# Patient Record
Sex: Male | Born: 1945
Health system: Southern US, Community
[De-identification: ages and names within clinical notes are randomized; demographics above are authoritative.]

## PROBLEM LIST (undated history)

## (undated) DIAGNOSIS — C189 Malignant neoplasm of colon, unspecified: Secondary | ICD-10-CM

## (undated) DIAGNOSIS — K219 Gastro-esophageal reflux disease without esophagitis: Secondary | ICD-10-CM

## (undated) DIAGNOSIS — C801 Malignant (primary) neoplasm, unspecified: Secondary | ICD-10-CM

## (undated) DIAGNOSIS — Z972 Presence of dental prosthetic device (complete) (partial): Secondary | ICD-10-CM

## (undated) DIAGNOSIS — R011 Cardiac murmur, unspecified: Secondary | ICD-10-CM

## (undated) DIAGNOSIS — J449 Chronic obstructive pulmonary disease, unspecified: Secondary | ICD-10-CM

## (undated) DIAGNOSIS — E119 Type 2 diabetes mellitus without complications: Secondary | ICD-10-CM

## (undated) DIAGNOSIS — T7840XA Allergy, unspecified, initial encounter: Secondary | ICD-10-CM

## (undated) DIAGNOSIS — R519 Headache, unspecified: Secondary | ICD-10-CM

## (undated) DIAGNOSIS — E785 Hyperlipidemia, unspecified: Secondary | ICD-10-CM

## (undated) HISTORY — DX: Type 2 diabetes mellitus without complications: E11.9

## (undated) HISTORY — DX: Allergy, unspecified, initial encounter: T78.40XA

## (undated) HISTORY — DX: Hyperlipidemia, unspecified: E78.5

## (undated) HISTORY — DX: Malignant neoplasm of colon, unspecified: C18.9

---

## 1976-01-07 HISTORY — PX: VASECTOMY: SHX75

## 2008-08-17 ENCOUNTER — Ambulatory Visit: Payer: Self-pay | Admitting: Internal Medicine

## 2009-08-01 ENCOUNTER — Ambulatory Visit: Payer: Self-pay | Admitting: Internal Medicine

## 2011-08-07 ENCOUNTER — Ambulatory Visit: Payer: Self-pay | Admitting: Medical

## 2013-06-09 DIAGNOSIS — M6530 Trigger finger, unspecified finger: Secondary | ICD-10-CM | POA: Insufficient documentation

## 2013-09-30 DIAGNOSIS — F17200 Nicotine dependence, unspecified, uncomplicated: Secondary | ICD-10-CM | POA: Insufficient documentation

## 2014-01-06 HISTORY — PX: TRIGGER FINGER RELEASE: SHX641

## 2015-11-27 ENCOUNTER — Telehealth: Payer: Self-pay | Admitting: Surgery

## 2015-11-27 NOTE — Telephone Encounter (Signed)
Left voice message for patient to call and schedule appointment for swelling of inguinal region. Referred by [facility]

## 2015-12-04 ENCOUNTER — Other Ambulatory Visit: Payer: Self-pay

## 2015-12-04 ENCOUNTER — Encounter: Payer: Self-pay | Admitting: Surgery

## 2015-12-04 ENCOUNTER — Ambulatory Visit (INDEPENDENT_AMBULATORY_CARE_PROVIDER_SITE_OTHER): Payer: Medicare Other | Admitting: Surgery

## 2015-12-04 VITALS — BP 111/79 | HR 90 | Temp 98.1°F | Ht 75.0 in | Wt 176.0 lb

## 2015-12-04 DIAGNOSIS — I10 Essential (primary) hypertension: Secondary | ICD-10-CM | POA: Insufficient documentation

## 2015-12-04 DIAGNOSIS — K4020 Bilateral inguinal hernia, without obstruction or gangrene, not specified as recurrent: Secondary | ICD-10-CM | POA: Diagnosis not present

## 2015-12-04 DIAGNOSIS — E785 Hyperlipidemia, unspecified: Secondary | ICD-10-CM | POA: Insufficient documentation

## 2015-12-04 DIAGNOSIS — Z8619 Personal history of other infectious and parasitic diseases: Secondary | ICD-10-CM | POA: Insufficient documentation

## 2015-12-04 DIAGNOSIS — J449 Chronic obstructive pulmonary disease, unspecified: Secondary | ICD-10-CM | POA: Insufficient documentation

## 2015-12-04 NOTE — Progress Notes (Signed)
Subjective:     Patient ID: Kirk Rodriguez, male   DOB: [birth year], 70 y.o.   MRN: [id number]  HPI  70 year old male with well-controlled hyper lipidemia comes in today with complaint of left groin swelling and bulge in the area approximately 1 year ago. The patient states that he had a bad sinus infection and was coughing about a year ago and noticed the area bulging out. The patient states that it doesn't cause pain in the area but he can feel a pressure there. Patient states that it does bulge out he's able to pop it back in very easily. Patient states whenever he coughs or strains sometimes it will pop out but his never had any trouble getting it back in. Patient has a good appetite has been eating well he has not lost any weight he does not have any difficulty with nausea vomiting or constipation. He has not ever had a time where this is popped out he's had to lay down to pop it back in. Patient does not have any difficulty getting around and moving around. 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 He is very active and walks vigorously daily.  Past Medical History:  Diagnosis Date  . Allergy   . Hyperlipidemia    Past Surgical History:  Procedure Laterality Date  . TRIGGER FINGER RELEASE Left 2016  . VASECTOMY Bilateral 1978   Family History  Problem Relation Age of Onset  . Lung cancer Father   . Heart disease Father   . Hypertension Father   . Polycythemia Mother   . Heart disease Mother    Social History   Social History  . Marital status: Married    Spouse name: N/A  . Number of children: N/A  . Years of education: N/A   Social History Main Topics  . Smoking status: Current Every Day Smoker    Packs/day: 0.50    Years: 50.00  . Smokeless tobacco: Never Used  . Alcohol use Yes  . Drug use: No  . Sexual activity: Not Asked   Other Topics Concern  . None   Social  History Narrative  . None    Current Outpatient Prescriptions:  .  aspirin EC 81 MG tablet, Take by mouth., Disp: , Rfl:  .  Cholecalciferol (VITAMIN D3) 2000 units capsule, Take by mouth., Disp: , Rfl:  .  FLUZONE HIGH-DOSE 0.5 ML SUSY, , Disp: , Rfl:  .  Multiple Vitamin (MULTI-VITAMINS) TABS, Take by mouth., Disp: , Rfl:  .  simvastatin (ZOCOR) 80 MG tablet, , Disp: , Rfl:  Not on File   Review of Systems  Constitutional: Negative for activity change, appetite change, chills, diaphoresis, fatigue, fever and unexpected weight change.  HENT: Negative for congestion and sore throat.   Respiratory: Negative for cough, choking, shortness of breath, wheezing and stridor.   Cardiovascular: Negative for chest pain, palpitations and leg swelling.  Gastrointestinal: Negative for abdominal distention, abdominal pain, anal bleeding, blood in stool, constipation, diarrhea, nausea and vomiting.  Genitourinary: Positive for scrotal swelling. Negative for discharge, dysuria, flank pain, hematuria, penile pain, penile swelling and testicular pain.  Musculoskeletal: Negative for back pain and neck pain.  Skin: Negative for color change, pallor, rash and wound.  Neurological: Negative for dizziness and weakness.  Hematological: Negative for adenopathy. Does not bruise/bleed easily.  Psychiatric/Behavioral: Negative for agitation. The patient is  not nervous/anxious.   All other systems reviewed and are negative.      Vitals:   12/04/15 1401  BP: 111/79  Pulse: 90  Temp: 98.1 F (36.7 C)    Objective:   Physical Exam  Constitutional: He is oriented to person, place, and time. He appears well-developed and well-nourished. No distress.  HENT:  Head: Normocephalic and atraumatic.  Right Ear: External ear normal.  Left Ear: External ear normal.  Nose: Nose normal.  Mouth/Throat: Oropharynx is clear and moist. No oropharyngeal exudate.  Eyes: Conjunctivae and EOM are normal. Pupils are equal,  round, and reactive to light. No scleral icterus.  Neck: Normal range of motion. Neck supple. No tracheal deviation present.  Cardiovascular: Normal rate, regular rhythm, normal heart sounds and intact distal pulses.  Exam reveals no gallop and no friction rub.   No murmur heard. Pulmonary/Chest: Effort normal and breath sounds normal. No respiratory distress. He has no wheezes. He has no rales.  Abdominal: Soft. Bowel sounds are normal. He exhibits no distension. There is no tenderness. There is no rebound and no guarding.  Genitourinary: Rectum normal and penis normal. No penile tenderness.  Genitourinary Comments: Left groin with a inguinal hernia approximately 2 cm in size that is easily reducible even with standing and nontender  Right groin with an area of bulge with coughing no distinct defect however large amount of weakness in the same 2 cm area as the left side  Musculoskeletal: Normal range of motion. He exhibits no edema, tenderness or deformity.  Neurological: He is alert and oriented to person, place, and time. No cranial nerve deficit.  Skin: Skin is warm. No rash noted. No erythema. No pallor.  Psychiatric: He has a normal mood and affect. His behavior is normal. Judgment and thought content normal.  Vitals reviewed.      CBC and BMP from 10/09/2015 in Care everywhere   WBC: 7.9, Hgb: 16.0, Hct: 46.0, Plt: 216  NA: 138  K:5.2  Cl:  103  CO2: 29.7  BUN: 20  Cr:1.1  Ca: 9.7  Assessment:     70 year old male with bilateral inguinal hernias    Plan:     I have personally reviewed his past medical history which is positive for hyperlipidemia which is well controlled on simvastatin and aspirin, as well as allergic rhinitis which he takes Nasacort spray and multiple cases of trigger finger in his lateral hands. He comes in today with complaint of left inguinal hernia which is easily reducible and a right hernia defect as well on exam. I discussed with the patient that given how  active he is in the fact that he still does his job and lifts and moves around that it may be in his best interest to get this fixed prior to becoming strangulated. I did discuss with him laparoscopic and open repairs however given that he has bilateral defect that I would recommend a laparoscopic repair to repair both sides at one time.  I discussed possibility of incarceration, strangulation, enlargement in size over time, and the risk of emergency surgery in the face of strangulation.  Also discussed the risk of surgery including recurrence which can be up to 30% in the case of complex hernias, use of prosthetic materials (mesh) and the increased risk of infxn, post-op infxn and the possible need for re-operation and removal of mesh if used, possibility of post-op SBO or ileus, and the risks of general anesthetic including MI, CVA, sudden death or even reaction  to anesthetic medications. The patient understands the risks, any and all questions were answered to the patient's satisfaction.  I will schedule him for a laparoscopic bilateral inguinal hernia repair on 12/12.

## 2015-12-04 NOTE — Patient Instructions (Addendum)
We have your surgery scheduled on 12/17/16 with Dr.Loflin. Please see the blue pre-care sheet for surgery information. Please call our office if you have any questions or concerns.

## 2015-12-05 ENCOUNTER — Telehealth: Payer: Self-pay | Admitting: Surgery

## 2015-12-05 NOTE — Telephone Encounter (Signed)
Pt advised of pre op date/time and sx date. Sx: 12/18/15 with Dr Loflin--Laparoscopic bilateral inguinal hernia repair.  Pre op: 12/13/15 @ 11:00am--Office.   Patient made aware to call [phone], between 1-3:00pm the day before surgery, to find out what time to arrive.    Patient has agrees to pay a 250.00 co pay prior to surgery.

## 2015-12-13 ENCOUNTER — Encounter
Admission: RE | Admit: 2015-12-13 | Discharge: 2015-12-13 | Disposition: A | Discharge status: Home / Self Care | Payer: Medicare Other | Source: Ambulatory Visit | Attending: Surgery | Admitting: Surgery

## 2015-12-13 ENCOUNTER — Ambulatory Visit
Admission: RE | Admit: 2015-12-13 | Discharge: 2015-12-13 | Disposition: A | Discharge status: Home / Self Care | Payer: Medicare Other | Source: Ambulatory Visit | Attending: Surgery | Admitting: Surgery

## 2015-12-13 DIAGNOSIS — Z01818 Encounter for other preprocedural examination: Secondary | ICD-10-CM | POA: Diagnosis not present

## 2015-12-13 DIAGNOSIS — F17210 Nicotine dependence, cigarettes, uncomplicated: Secondary | ICD-10-CM

## 2015-12-13 DIAGNOSIS — J439 Emphysema, unspecified: Secondary | ICD-10-CM | POA: Insufficient documentation

## 2015-12-13 DIAGNOSIS — K4020 Bilateral inguinal hernia, without obstruction or gangrene, not specified as recurrent: Secondary | ICD-10-CM | POA: Insufficient documentation

## 2015-12-13 DIAGNOSIS — I491 Atrial premature depolarization: Secondary | ICD-10-CM | POA: Insufficient documentation

## 2015-12-13 NOTE — Patient Instructions (Signed)
  Your procedure is scheduled on: December 18, 2015 (Tuesday) Report to Same Day Surgery 2nd floor medical mall [facility] Entrance-take elevator on left to 2nd floor.  Check in with surgery information desk.) To find out your arrival time please call [phone] between 1PM - 3PM on December 17, 2015 (Monday)  Remember: Instructions that are not followed completely may result in serious medical risk, up to and including death, or upon the discretion of your surgeon and anesthesiologist your surgery may need to be rescheduled.    _x___ 1. Do not eat food or drink liquids after midnight. No gum chewing or hard candies.     __x__ 2. No Alcohol for 24 hours before or after surgery.   __x__3. No Smoking for 24 prior to surgery.   ____  4. Bring all medications with you on the day of surgery if instructed.    __x__ 5. Notify your doctor if there is any change in your medical condition     (cold, fever, infections).     Do not wear jewelry, make-up, hairpins, clips or nail polish.  Do not wear lotions, powders, or perfumes. You may wear deodorant.  Do not shave 48 hours prior to surgery. Men may shave face and neck.  Do not bring valuables to the hospital.    [facility] is not responsible for any belongings or valuables.               Contacts, dentures or bridgework may not be worn into surgery.  Leave your suitcase in the car. After surgery it may be brought to your room.  For patients admitted to the hospital, discharge time is determined by your treatment team.   Patients discharged the day of surgery will not be allowed to drive home.  You will need someone to drive you home and stay with you the night of your procedure.    Please read over the following fact sheets that you were given:   [facility] Preparing for Surgery and or MRSA Information   ___ Take these medicines the morning of surgery with A SIP OF WATER:    1.   2.  3.  4.  5.  6.  ____Fleets enema or  Magnesium Citrate as directed.   _x___ Use CHG Soap or sage wipes as directed on instruction sheet   ____ Use inhalers on the day of surgery and bring to hospital day of surgery  ____ Stop metformin 2 days prior to surgery    ____ Take 1/2 of usual insulin dose the night before surgery and none on the morning of  surgery         _x___ Stop Aspirin, Coumadin, Pllavix ,Eliquis, Effient, or Pradaxa  (Patient has stopped Aspirin on December 5)  x__ Stop Anti-inflammatories such as Advil, Aleve, Ibuprofen, Motrin, Naproxen,          Naprosyn, Goodies powders or aspirin products. Ok to take Tylenol.   ____ Stop supplements until after surgery.    ____ Bring C-Pap to the hospital.

## 2015-12-17 MED ORDER — CEFAZOLIN SODIUM-DEXTROSE 2-4 GM/100ML-% IV SOLN
2.0000 g | INTRAVENOUS | Status: AC
  Administered 2015-12-18: 2 g via INTRAVENOUS

## 2015-12-18 ENCOUNTER — Encounter
Admission: RE | Disposition: A | Discharge status: Home / Self Care | Payer: Self-pay | Source: Ambulatory Visit | Attending: Surgery

## 2015-12-18 ENCOUNTER — Ambulatory Visit: Payer: Medicare Other | Admitting: Anesthesiology

## 2015-12-18 ENCOUNTER — Ambulatory Visit
Admission: RE | Admit: 2015-12-18 | Discharge: 2015-12-18 | Disposition: A | Discharge status: Home / Self Care | Payer: Medicare Other | Source: Ambulatory Visit | Attending: Surgery | Admitting: Surgery

## 2015-12-18 DIAGNOSIS — Z7982 Long term (current) use of aspirin: Secondary | ICD-10-CM | POA: Insufficient documentation

## 2015-12-18 DIAGNOSIS — K4020 Bilateral inguinal hernia, without obstruction or gangrene, not specified as recurrent: Secondary | ICD-10-CM | POA: Diagnosis not present

## 2015-12-18 DIAGNOSIS — F17210 Nicotine dependence, cigarettes, uncomplicated: Secondary | ICD-10-CM | POA: Diagnosis not present

## 2015-12-18 DIAGNOSIS — E785 Hyperlipidemia, unspecified: Secondary | ICD-10-CM | POA: Insufficient documentation

## 2015-12-18 DIAGNOSIS — Z79899 Other long term (current) drug therapy: Secondary | ICD-10-CM | POA: Diagnosis not present

## 2015-12-18 HISTORY — PX: INGUINAL HERNIA REPAIR: SHX194

## 2015-12-18 SURGERY — REPAIR, HERNIA, INGUINAL, BILATERAL, LAPAROSCOPIC
Anesthesia: General | Laterality: Bilateral | Wound class: Clean

## 2015-12-18 MED ORDER — ONDANSETRON HCL 4 MG/2ML IJ SOLN
INTRAMUSCULAR | Status: DC | PRN
  Administered 2015-12-18: 4 mg via INTRAVENOUS

## 2015-12-18 MED ORDER — MIDAZOLAM HCL 2 MG/2ML IJ SOLN
INTRAMUSCULAR | Status: DC | PRN
  Administered 2015-12-18: 2 mg via INTRAVENOUS

## 2015-12-18 MED ORDER — CHLORHEXIDINE GLUCONATE CLOTH 2 % EX PADS: 6.0000 | MEDICATED_PAD | Freq: Once | CUTANEOUS | Status: DC

## 2015-12-18 MED ORDER — ROCURONIUM BROMIDE 100 MG/10ML IV SOLN
INTRAVENOUS | Status: DC | PRN
  Administered 2015-12-18 (×2): 10 mg via INTRAVENOUS
  Administered 2015-12-18: 40 mg via INTRAVENOUS

## 2015-12-18 MED ORDER — ONDANSETRON HCL 4 MG/2ML IJ SOLN: 4.0000 mg | Freq: Once | INTRAMUSCULAR | Status: DC | PRN

## 2015-12-18 MED ORDER — FAMOTIDINE 20 MG PO TABS
ORAL_TABLET | ORAL | Status: AC
  Administered 2015-12-18: 20 mg via ORAL
  Filled 2015-12-18: qty 1

## 2015-12-18 MED ORDER — ACETAMINOPHEN 10 MG/ML IV SOLN
INTRAVENOUS | Status: AC
  Filled 2015-12-18: qty 100

## 2015-12-18 MED ORDER — BUPIVACAINE HCL (PF) 0.25 % IJ SOLN
INTRAMUSCULAR | Status: DC | PRN
  Administered 2015-12-18: 30 mL

## 2015-12-18 MED ORDER — PHENYLEPHRINE HCL 10 MG/ML IJ SOLN
INTRAMUSCULAR | Status: DC | PRN
  Administered 2015-12-18 (×4): 100 ug via INTRAVENOUS

## 2015-12-18 MED ORDER — FAMOTIDINE 20 MG PO TABS
20.0000 mg | ORAL_TABLET | Freq: Once | ORAL | Status: AC
  Administered 2015-12-18: 20 mg via ORAL

## 2015-12-18 MED ORDER — ACETAMINOPHEN 10 MG/ML IV SOLN
INTRAVENOUS | Status: DC | PRN
  Administered 2015-12-18: 1000 mg via INTRAVENOUS

## 2015-12-18 MED ORDER — CEFAZOLIN SODIUM-DEXTROSE 2-4 GM/100ML-% IV SOLN
INTRAVENOUS | Status: AC
  Administered 2015-12-18: 2 g via INTRAVENOUS
  Filled 2015-12-18: qty 100

## 2015-12-18 MED ORDER — FENTANYL CITRATE (PF) 100 MCG/2ML IJ SOLN
INTRAMUSCULAR | Status: DC | PRN
  Administered 2015-12-18: 150 ug via INTRAVENOUS
  Administered 2015-12-18: 50 ug via INTRAVENOUS
  Administered 2015-12-18: 100 ug via INTRAVENOUS

## 2015-12-18 MED ORDER — IBUPROFEN 800 MG PO TABS: 800.0000 mg | ORAL_TABLET | Freq: Three times a day (TID) | ORAL | 0 refills | Status: DC | PRN

## 2015-12-18 MED ORDER — GLYCOPYRROLATE 0.2 MG/ML IJ SOLN
INTRAMUSCULAR | Status: DC | PRN
  Administered 2015-12-18: 0.2 mg via INTRAVENOUS

## 2015-12-18 MED ORDER — ALBUTEROL SULFATE HFA 108 (90 BASE) MCG/ACT IN AERS
INHALATION_SPRAY | RESPIRATORY_TRACT | Status: DC | PRN
  Administered 2015-12-18: 5 via RESPIRATORY_TRACT

## 2015-12-18 MED ORDER — DEXMEDETOMIDINE HCL 200 MCG/2ML IV SOLN
INTRAVENOUS | Status: DC | PRN
  Administered 2015-12-18: 12 ug via INTRAVENOUS

## 2015-12-18 MED ORDER — LIDOCAINE 2% (20 MG/ML) 5 ML SYRINGE
INTRAMUSCULAR | Status: DC | PRN
  Administered 2015-12-18: 100 mg via INTRAVENOUS

## 2015-12-18 MED ORDER — PROPOFOL 10 MG/ML IV BOLUS
INTRAVENOUS | Status: DC | PRN
  Administered 2015-12-18: 100 mg via INTRAVENOUS

## 2015-12-18 MED ORDER — EPHEDRINE SULFATE 50 MG/ML IJ SOLN
INTRAMUSCULAR | Status: DC | PRN
  Administered 2015-12-18: 10 mg via INTRAVENOUS

## 2015-12-18 MED ORDER — FENTANYL CITRATE (PF) 100 MCG/2ML IJ SOLN: 25.0000 ug | INTRAMUSCULAR | Status: DC | PRN

## 2015-12-18 MED ORDER — SUGAMMADEX SODIUM 200 MG/2ML IV SOLN
INTRAVENOUS | Status: DC | PRN
  Administered 2015-12-18: 159.6 mg via INTRAVENOUS

## 2015-12-18 MED ORDER — BUPIVACAINE HCL (PF) 0.25 % IJ SOLN
INTRAMUSCULAR | Status: AC
  Filled 2015-12-18: qty 30

## 2015-12-18 MED ORDER — LACTATED RINGERS IV SOLN
INTRAVENOUS | Status: DC
  Administered 2015-12-18 (×3): via INTRAVENOUS

## 2015-12-18 MED ORDER — HYDROCODONE-ACETAMINOPHEN 5-325 MG PO TABS: 1.0000 | ORAL_TABLET | Freq: Four times a day (QID) | ORAL | 0 refills | Status: DC | PRN

## 2015-12-18 MED ORDER — DEXAMETHASONE SODIUM PHOSPHATE 10 MG/ML IJ SOLN
INTRAMUSCULAR | Status: DC | PRN
  Administered 2015-12-18: 10 mg via INTRAVENOUS

## 2015-12-18 MED ORDER — SUCCINYLCHOLINE CHLORIDE 20 MG/ML IJ SOLN
INTRAMUSCULAR | Status: DC | PRN
  Administered 2015-12-18: 100 mg via INTRAVENOUS

## 2015-12-18 SURGICAL SUPPLY — 36 items
CANISTER SUCT 1200ML W/VALVE (MISCELLANEOUS) ×2 IMPLANT
CATH TRAY 16F METER LATEX (MISCELLANEOUS) ×2 IMPLANT
CHLORAPREP W/TINT 26ML (MISCELLANEOUS) ×2 IMPLANT
DEFOGGER SCOPE WARMER CLEARIFY (MISCELLANEOUS) ×2 IMPLANT
DERMABOND ADVANCED (GAUZE/BANDAGES/DRESSINGS) ×1
DERMABOND ADVANCED .7 DNX12 (GAUZE/BANDAGES/DRESSINGS) ×1 IMPLANT
DEVICE SECURE STRAP 25 ABSORB (INSTRUMENTS) ×4 IMPLANT
DISSECT BALLN SPACEMKR OVL PDB (BALLOONS) ×2
DISSECTOR BALLN SPCMKR OVL PDB (BALLOONS) ×1 IMPLANT
ELECT CAUTERY BLADE 6.4 (BLADE) ×2 IMPLANT
ELECT REM PT RETURN 9FT ADLT (ELECTROSURGICAL) ×2
ELECTRODE REM PT RTRN 9FT ADLT (ELECTROSURGICAL) ×1 IMPLANT
GLOVE PI ORTHOPRO 6.5 (GLOVE) ×1
GLOVE PI ORTHOPRO STRL 6.5 (GLOVE) ×1 IMPLANT
GOWN STRL REUS W/ TWL LRG LVL3 (GOWN DISPOSABLE) ×2 IMPLANT
GOWN STRL REUS W/TWL LRG LVL3 (GOWN DISPOSABLE) ×2
IRRIGATION STRYKERFLOW (MISCELLANEOUS) IMPLANT
IRRIGATOR STRYKERFLOW (MISCELLANEOUS)
IV NS 1000ML (IV SOLUTION)
IV NS 1000ML BAXH (IV SOLUTION) IMPLANT
KIT RM TURNOVER STRD PROC AR (KITS) ×2 IMPLANT
LABEL OR SOLS (LABEL) ×2 IMPLANT
MESH PARIETEX 6X4IN LEFT (Mesh General) ×2 IMPLANT
MESH PARIETEX 6X4IN RIGHT (Mesh General) ×2 IMPLANT
NEEDLE HYPO 25X1 1.5 SAFETY (NEEDLE) ×2 IMPLANT
NS IRRIG 500ML POUR BTL (IV SOLUTION) ×2 IMPLANT
PACK LAP CHOLECYSTECTOMY (MISCELLANEOUS) ×2 IMPLANT
PENCIL ELECTRO HAND CTR (MISCELLANEOUS) ×2 IMPLANT
SLEEVE ENDOPATH XCEL 5M (ENDOMECHANICALS) ×2 IMPLANT
SUT MNCRL 4-0 (SUTURE) ×1
SUT MNCRL 4-0 27XMFL (SUTURE) ×1
SUT VIC AB 0 CT2 27 (SUTURE) ×2 IMPLANT
SUTURE MNCRL 4-0 27XMF (SUTURE) ×1 IMPLANT
TROCAR BALLN 10M OMST10SB SPAC (TROCAR) ×2 IMPLANT
TROCAR XCEL NON-BLD 5MMX100MML (ENDOMECHANICALS) ×2 IMPLANT
TUBING INSUFFLATOR HI FLOW (MISCELLANEOUS) ×2 IMPLANT

## 2015-12-18 NOTE — Anesthesia Preprocedure Evaluation (Signed)
Anesthesia Evaluation  Patient identified by MRN, date of birth, ID band Patient awake    Reviewed: Allergy & Precautions, H&P , NPO status , Patient's Chart, lab work & pertinent test results, reviewed documented beta blocker date and time   Airway Mallampati: II  TM Distance: >3 FB Neck ROM: full    Dental  (+) Teeth Intact   Pulmonary neg pulmonary ROS, COPD, Current Smoker,    Pulmonary exam normal        Cardiovascular hypertension, On Medications negative cardio ROS Normal cardiovascular exam Rhythm:regular Rate:Normal     Neuro/Psych negative neurological ROS  negative psych ROS   GI/Hepatic negative GI ROS, Neg liver ROS,   Endo/Other  negative endocrine ROS  Renal/GU negative Renal ROS  negative genitourinary   Musculoskeletal   Abdominal   Peds  Hematology negative hematology ROS (+)   Anesthesia Other Findings Past Medical History: No date: Allergy No date: Hyperlipidemia Past Surgical History: 2016: TRIGGER FINGER RELEASE Left 1978: VASECTOMY Bilateral BMI    Body Mass Index:  23.22 kg/m     Reproductive/Obstetrics negative OB ROS                             Anesthesia Physical Anesthesia Plan  ASA: II  Anesthesia Plan: General ETT   Post-op Pain Management:    Induction:   Airway Management Planned:   Additional Equipment:   Intra-op Plan:   Post-operative Plan:   Informed Consent: I have reviewed the patients History and Physical, chart, labs and discussed the procedure including the risks, benefits and alternatives for the proposed anesthesia with the patient or authorized representative who has indicated his/her understanding and acceptance.   Dental Advisory Given  Plan Discussed with: CRNA  Anesthesia Plan Comments:         Anesthesia Quick Evaluation

## 2015-12-18 NOTE — Interval H&P Note (Signed)
History and Physical Interval Note:  12/18/2015 9:18 AM  Kirk Rodriguez  has presented today for surgery, with the diagnosis of Bilateral inguinal hernias  The various methods of treatment have been discussed with the patient and family. After consideration of risks, benefits and other options for treatment, the patient has consented to  Procedure(s): Oakley (Bilateral) as a surgical intervention .  The patient's history has been reviewed, patient examined, no change in status, stable for surgery.  I have reviewed the patient's chart and labs.  Questions were answered to the patient's satisfaction.     Kendryck Lacroix L Jordyn Doane

## 2015-12-18 NOTE — Transfer of Care (Signed)
Immediate Anesthesia Transfer of Care Note  Patient: Kirk Rodriguez  Procedure(s) Performed: Procedure(s): LAPAROSCOPIC BILATERAL INGUINAL HERNIA REPAIR (Bilateral)  Patient Location: PACU  Anesthesia Type:General  Level of Consciousness: awake, alert  and oriented  Airway & Oxygen Therapy: Patient Spontanous Breathing and Patient connected to face mask oxygen  Post-op Assessment: Report given to RN and Post -op Vital signs reviewed and stable  Post vital signs: Reviewed and stable  Last Vitals:  Vitals:   12/18/15 0818  BP: 110/79  Pulse: 65  Resp: 16  Temp: 36.8 C    Last Pain:  Vitals:   12/18/15 0818  TempSrc: Oral  PainSc: 0-No pain         Complications: No apparent anesthesia complications

## 2015-12-18 NOTE — Op Note (Signed)
LaparoscopicBilateral Inguinal Hernia Repair  Kirk Rodriguez  12/18/2015  Pre-operative Diagnosis:  Bilateral Inguinal Hernia  Post-operative Diagnosis: Bilateral Inguinal hernia  Procedure: Laparoscopic preperitoneal repair of bilateral inguinal hernias  Surgeon: Susa Griffins, MD  Anesthesia: Gen. with endotracheal tube  Assistant: None  Procedure Details  The patient was seen again in the Holding Room. The benefits, complications, treatment options, and expected outcomes were discussed with the patient. The risks of bleeding, infection, recurrence of symptoms, failure to resolve symptoms, recurrence of hernia, ischemic orchitis, chronic pain syndrome or neuroma, were discussed again. The likelihood of improving the patient's symptoms with return to their baseline status is good.  The patient and/or family concurred with the proposed plan, giving informed consent.  The patient was taken to Operating Room, identified as Kirk Rodriguez and the procedure verified as Laparoscopic Bilateral Inguinal Hernia Repair.  A Time Out was held and the above information confirmed.  Prior to the induction of general anesthesia, antibiotic prophylaxis was administered. VTE prophylaxis was in place. General endotracheal anesthesia was then administered and tolerated well. A Foley catheter was placed by the nursing staff. After the induction, the abdomen was prepped with Chloraprep and draped in the sterile fashion. The patient was positioned in the supine position.  Local anesthetic  was injected into the skin near the umbilicus and an incision made. An incision was made and dissection down to the rectus fascia was performed. The fascia was incised and the muscle retracted laterally. The Covidien dissecting balloon was placed followed by the structural balloon. The preperitoneal space was insufflated and under direct vision two midline 5 mm ports were placed.  Dissection was performed to delineate  Cooper's ligament and the lateral extent of dissection was determined on each side. The nerve on the lateral abdominal wall was identified and kept in view at all times. The cord was skeletonized of the indirect sac and cord lipoma which was retracted cephalad on each side.   Once this was complete, a mesh was placed into the preperitoneal space on each side to match the laterally. They were held in place with the Covidien tacking device along the pubic tubericle. Once assuring that the hernias were completely repaired and adequately covered, the preperitoneal space was desufflated under direct vision. There was no sign of peritoneal rent and no sign of bowel intrusion towards the mesh.  Once assuring that hemostasis was adequate the ports were removed and a figure-of-eight 0 Vicryl suture was placed at the fascial edges. 4-0 subcuticular Monocryl was used at all skin edges. Steri-Strips and Mastisol and sterile dressings were placed.  Patient tolerated the procedure well. There were no complications.    Findings: Large left incarcerated indirect and direct hernia, small right sided indirect hernia with small lipoma;

## 2015-12-18 NOTE — H&P (View-Only) (Signed)
Subjective:     Patient ID: Kirk Rodriguez, male   DOB: 1945/07/22, 70 y.o.   MRN: [id number]  HPI  70 year old male with well-controlled hyper lipidemia comes in today with complaint of left groin swelling and bulge in the area approximately 1 year ago. The patient states that he had a bad sinus infection and was coughing about a year ago and noticed the area bulging out. The patient states that it doesn't cause pain in the area but he can feel a pressure there. Patient states that it does bulge out he's able to pop it back in very easily. Patient states whenever he coughs or strains sometimes it will pop out but his never had any trouble getting it back in. Patient has a good appetite has been eating well he has not lost any weight he does not have any difficulty with nausea vomiting or constipation. He has not ever had a time where this is popped out he's had to lay down to pop it back in. Patient does not have any difficulty getting around and moving around. 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 He is very active and walks vigorously daily.  Past Medical History:  Diagnosis Date  . Allergy   . Hyperlipidemia    Past Surgical History:  Procedure Laterality Date  . TRIGGER FINGER RELEASE Left 2016  . VASECTOMY Bilateral 1978   Family History  Problem Relation Age of Onset  . Lung cancer Father   . Heart disease Father   . Hypertension Father   . Polycythemia Mother   . Heart disease Mother    Social History   Social History  . Marital status: Married    Spouse name: N/A  . Number of children: N/A  . Years of education: N/A   Social History Main Topics  . Smoking status: Current Every Day Smoker    Packs/day: 0.50    Years: 50.00  . Smokeless tobacco: Never Used  . Alcohol use Yes  . Drug use: No  . Sexual activity: Not Asked   Other Topics Concern  . None   Social  History Narrative  . None    Current Outpatient Prescriptions:  .  aspirin EC 81 MG tablet, Take by mouth., Disp: , Rfl:  .  Cholecalciferol (VITAMIN D3) 2000 units capsule, Take by mouth., Disp: , Rfl:  .  FLUZONE HIGH-DOSE 0.5 ML SUSY, , Disp: , Rfl:  .  Multiple Vitamin (MULTI-VITAMINS) TABS, Take by mouth., Disp: , Rfl:  .  simvastatin (ZOCOR) 80 MG tablet, , Disp: , Rfl:  Not on File   Review of Systems  Constitutional: Negative for activity change, appetite change, chills, diaphoresis, fatigue, fever and unexpected weight change.  HENT: Negative for congestion and sore throat.   Respiratory: Negative for cough, choking, shortness of breath, wheezing and stridor.   Cardiovascular: Negative for chest pain, palpitations and leg swelling.  Gastrointestinal: Negative for abdominal distention, abdominal pain, anal bleeding, blood in stool, constipation, diarrhea, nausea and vomiting.  Genitourinary: Positive for scrotal swelling. Negative for discharge, dysuria, flank pain, hematuria, penile pain, penile swelling and testicular pain.  Musculoskeletal: Negative for back pain and neck pain.  Skin: Negative for color change, pallor, rash and wound.  Neurological: Negative for dizziness and weakness.  Hematological: Negative for adenopathy. Does not bruise/bleed easily.  Psychiatric/Behavioral: Negative for agitation. The patient is  not nervous/anxious.   All other systems reviewed and are negative.      Vitals:   12/04/15 1401  BP: 111/79  Pulse: 90  Temp: 98.1 F (36.7 C)    Objective:   Physical Exam  Constitutional: He is oriented to person, place, and time. He appears well-developed and well-nourished. No distress.  HENT:  Head: Normocephalic and atraumatic.  Right Ear: External ear normal.  Left Ear: External ear normal.  Nose: Nose normal.  Mouth/Throat: Oropharynx is clear and moist. No oropharyngeal exudate.  Eyes: Conjunctivae and EOM are normal. Pupils are equal,  round, and reactive to light. No scleral icterus.  Neck: Normal range of motion. Neck supple. No tracheal deviation present.  Cardiovascular: Normal rate, regular rhythm, normal heart sounds and intact distal pulses.  Exam reveals no gallop and no friction rub.   No murmur heard. Pulmonary/Chest: Effort normal and breath sounds normal. No respiratory distress. He has no wheezes. He has no rales.  Abdominal: Soft. Bowel sounds are normal. He exhibits no distension. There is no tenderness. There is no rebound and no guarding.  Genitourinary: Rectum normal and penis normal. No penile tenderness.  Genitourinary Comments: Left groin with a inguinal hernia approximately 2 cm in size that is easily reducible even with standing and nontender  Right groin with an area of bulge with coughing no distinct defect however large amount of weakness in the same 2 cm area as the left side  Musculoskeletal: Normal range of motion. He exhibits no edema, tenderness or deformity.  Neurological: He is alert and oriented to person, place, and time. No cranial nerve deficit.  Skin: Skin is warm. No rash noted. No erythema. No pallor.  Psychiatric: He has a normal mood and affect. His behavior is normal. Judgment and thought content normal.  Vitals reviewed.      CBC and BMP from 10/09/2015 in Care everywhere   WBC: 7.9, Hgb: 16.0, Hct: 46.0, Plt: 216  NA: 138  K:5.2  Cl:  103  CO2: 29.7  BUN: 20  Cr:1.1  Ca: 9.7  Assessment:     70 year old male with bilateral inguinal hernias    Plan:     I have personally reviewed his past medical history which is positive for hyperlipidemia which is well controlled on simvastatin and aspirin, as well as allergic rhinitis which he takes Nasacort spray and multiple cases of trigger finger in his lateral hands. He comes in today with complaint of left inguinal hernia which is easily reducible and a right hernia defect as well on exam. I discussed with the patient that given how  active he is in the fact that he still does his job and lifts and moves around that it may be in his best interest to get this fixed prior to becoming strangulated. I did discuss with him laparoscopic and open repairs however given that he has bilateral defect that I would recommend a laparoscopic repair to repair both sides at one time.  I discussed possibility of incarceration, strangulation, enlargement in size over time, and the risk of emergency surgery in the face of strangulation.  Also discussed the risk of surgery including recurrence which can be up to 30% in the case of complex hernias, use of prosthetic materials (mesh) and the increased risk of infxn, post-op infxn and the possible need for re-operation and removal of mesh if used, possibility of post-op SBO or ileus, and the risks of general anesthetic including MI, CVA, sudden death or even reaction  to anesthetic medications. The patient understands the risks, any and all questions were answered to the patient's satisfaction.  I will schedule him for a laparoscopic bilateral inguinal hernia repair on 12/12.

## 2015-12-18 NOTE — Discharge Instructions (Signed)
AMBULATORY SURGERY  °DISCHARGE INSTRUCTIONS ° ° °1) The drugs that you were given will stay in your system until tomorrow so for the next 24 hours you should not: ° °A) Drive an automobile °B) Make any legal decisions °C) Drink any alcoholic beverage ° ° °2) You may resume regular meals tomorrow.  Today it is better to start with liquids and gradually work up to solid foods. ° °You may eat anything you prefer, but it is better to start with liquids, then soup and crackers, and gradually work up to solid foods. ° ° °3) Please notify your doctor immediately if you have any unusual bleeding, trouble breathing, redness and pain at the surgery site, drainage, fever, or pain not relieved by medication. ° ° ° °4) Additional Instructions: ° ° ° ° ° ° ° °Please contact your physician with any problems or Same Day Surgery at [phone], Monday through Friday 6 am to 4 pm, or [facility] Main number at [phone]. °

## 2015-12-18 NOTE — Anesthesia Procedure Notes (Signed)
Procedure Name: Intubation Date/Time: 12/18/2015 10:02 AM Performed by: Marsh Dolly Pre-anesthesia Checklist: Patient identified, Patient being monitored, Timeout performed, Emergency Drugs available and Suction available Patient Re-evaluated:Patient Re-evaluated prior to inductionOxygen Delivery Method: Circle system utilized Preoxygenation: Pre-oxygenation with 100% oxygen Intubation Type: IV induction Ventilation: Mask ventilation without difficulty Laryngoscope Size: 3 and Miller Grade View: Grade I Tube type: Oral Tube size: 7.5 mm Number of attempts: 1 Placement Confirmation: ETT inserted through vocal cords under direct vision,  positive ETCO2 and breath sounds checked- equal and bilateral Secured at: 21 cm Tube secured with: Tape Dental Injury: Teeth and Oropharynx as per pre-operative assessment

## 2015-12-19 ENCOUNTER — Encounter: Payer: Self-pay | Admitting: Surgery

## 2015-12-20 ENCOUNTER — Encounter: Payer: Self-pay | Admitting: Surgery

## 2015-12-20 ENCOUNTER — Ambulatory Visit (INDEPENDENT_AMBULATORY_CARE_PROVIDER_SITE_OTHER): Payer: Medicare Other | Admitting: Surgery

## 2015-12-20 VITALS — BP 135/80 | HR 81 | Temp 98.3°F | Ht 73.0 in | Wt 184.0 lb

## 2015-12-20 DIAGNOSIS — K4020 Bilateral inguinal hernia, without obstruction or gangrene, not specified as recurrent: Secondary | ICD-10-CM

## 2015-12-20 NOTE — Patient Instructions (Signed)
Please see your follow up appointment listed below. Please call our office if you have any questions or concerns.

## 2015-12-20 NOTE — Anesthesia Postprocedure Evaluation (Signed)
Anesthesia Post Note  Patient: Kirk Rodriguez  Procedure(s) Performed: Procedure(s) (LRB): LAPAROSCOPIC BILATERAL INGUINAL HERNIA REPAIR (Bilateral)  Patient location during evaluation: PACU Anesthesia Type: General Level of consciousness: awake and alert Pain management: pain level controlled Vital Signs Assessment: post-procedure vital signs reviewed and stable Respiratory status: spontaneous breathing, nonlabored ventilation, respiratory function stable and patient connected to nasal cannula oxygen Cardiovascular status: blood pressure returned to baseline and stable Postop Assessment: no signs of nausea or vomiting Anesthetic complications: no    Last Vitals:  Vitals:   12/18/15 1243 12/18/15 1320  BP: 120/73 117/67  Pulse: 79 66  Resp: 16 16  Temp: 36.2 C     Last Pain:  Vitals:   12/19/15 0855  TempSrc:   PainSc: 0-No pain                 Molli Barrows

## 2015-12-20 NOTE — Progress Notes (Signed)
Outpatient postop visit  12/20/2015  Kirk Rodriguez is an 70 y.o. male.    Procedure: Laparoscopic bilateral inguinal hernia repairs  CC: "Jiggling"  HPI: This patient status post laparoscopic bilateral inguinal hernia repairs using a preperitoneal approach by Dr. Heath Lark 2 days ago. He walked into the office not complaining of pain or discomfort but having the sensation of "jiggling" he also states that he had problems urinating afterwards but that has completely resolved. He was having painful urination and that is completely resolved.  Operative report is reviewed.  Medications reviewed.    Physical Exam:  BP 135/80   Pulse 81   Temp 98.3 F (36.8 C) (Oral)   Ht 6\' 1"  (1.854 m)   Wt 184 lb (83.5 kg)   BMI 24.28 kg/m     PE: Patient is examined supine. There is considerable ecchymosis across the abdomen and in the scrotum. There is obvious seroma especially on the left side smaller on the right.  (This represents and coincides with the findings at surgery of a large incarcerated left inguinal hernia and a smaller right inguinal hernia)    Assessment/Plan:  I discussed with the patient these findings and that they are common and that this ecchymosis will resolve in approximately 10 days and in fact it may look worse in a day or 2 as it turns from the pinkish color to a darker purple color. His wounds show no sign of infection at this point. He seems to be doing quite well other than this unusual sensation of "jiggling" which coincides with the rather large seromas. I reassured the patient of the common finding here and the fact that it will resolve but could take several weeks to go away completely. He understood all this and had no other complaints. He in fact took no pain medication following the surgery. He has an appointment with Dr. Azalee Course on the 28th.  Florene Glen, MD, FACS

## 2016-01-03 ENCOUNTER — Encounter: Payer: Self-pay | Admitting: Surgery

## 2016-01-03 ENCOUNTER — Ambulatory Visit (INDEPENDENT_AMBULATORY_CARE_PROVIDER_SITE_OTHER): Payer: Medicare Other | Admitting: Surgery

## 2016-01-03 VITALS — BP 118/79 | HR 69 | Temp 98.3°F | Ht 73.0 in | Wt 180.2 lb

## 2016-01-03 DIAGNOSIS — K4020 Bilateral inguinal hernia, without obstruction or gangrene, not specified as recurrent: Secondary | ICD-10-CM

## 2016-01-03 NOTE — Progress Notes (Signed)
Outpatient postop visit  01/03/2016  Kirk Rodriguez is an 70 y.o. male.    Procedure laparoscopic bilateral inguinal hernia repair  [id number]  HPI: This patient underwent a laparoscopic bilateral inguinal hernia repair by Dr. Azalee Course. Patient has no complaints. He only took 1 oral analgesic on the first postoperative day.  Medications reviewed.    Physical Exam:  BP 118/79   Pulse 69   Temp 98.3 F (36.8 C) (Oral)   Ht 6\' 1"  (1.854 m)   Wt 180 lb 3.2 oz (81.7 kg)   BMI 23.77 kg/m     PE: No ecchymosis no erythema nontender    Assessment/Plan:  Patient doing very well recommend follow up on an as-needed basis no restrictions at this point  Florene Glen, MD, FACS

## 2016-01-03 NOTE — Patient Instructions (Signed)
Please call our office with any questions or concerns.  Please do not submerge in a tub, hot tub, or pool until incisions are completely sealed.  Use sun block to incision area over the next year if this area will be exposed to sun. This helps decrease scarring.  You may resume your normal activities on 01/29/16. At that time- Listen to your body when lifting, if you have pain when lifting, stop and then try again in a few days. Pain after doing exercises or activities of daily living is normal as you get back in to your normal routine.  If you develop redness, drainage, or pain at incision sites- call our office immediately and speak with a nurse.

## 2016-01-08 ENCOUNTER — Encounter: Payer: Self-pay | Admitting: Surgery

## 2016-02-29 ENCOUNTER — Encounter: Payer: Self-pay | Admitting: *Deleted

## 2016-03-03 NOTE — Discharge Instructions (Signed)

## 2016-03-06 ENCOUNTER — Ambulatory Visit: Payer: Medicare Other | Admitting: Anesthesiology

## 2016-03-06 ENCOUNTER — Encounter
Admission: RE | Disposition: A | Discharge status: Home / Self Care | Payer: Self-pay | Source: Ambulatory Visit | Attending: Otolaryngology

## 2016-03-06 ENCOUNTER — Ambulatory Visit
Admission: RE | Admit: 2016-03-06 | Discharge: 2016-03-06 | Disposition: A | Discharge status: Home / Self Care | Payer: Medicare Other | Source: Ambulatory Visit | Attending: Otolaryngology | Admitting: Otolaryngology

## 2016-03-06 DIAGNOSIS — Z7951 Long term (current) use of inhaled steroids: Secondary | ICD-10-CM | POA: Diagnosis not present

## 2016-03-06 DIAGNOSIS — F17200 Nicotine dependence, unspecified, uncomplicated: Secondary | ICD-10-CM | POA: Diagnosis not present

## 2016-03-06 DIAGNOSIS — Z7982 Long term (current) use of aspirin: Secondary | ICD-10-CM | POA: Diagnosis not present

## 2016-03-06 DIAGNOSIS — J449 Chronic obstructive pulmonary disease, unspecified: Secondary | ICD-10-CM | POA: Diagnosis not present

## 2016-03-06 DIAGNOSIS — D020 Carcinoma in situ of larynx: Secondary | ICD-10-CM | POA: Insufficient documentation

## 2016-03-06 DIAGNOSIS — I10 Essential (primary) hypertension: Secondary | ICD-10-CM | POA: Insufficient documentation

## 2016-03-06 DIAGNOSIS — Z79899 Other long term (current) drug therapy: Secondary | ICD-10-CM | POA: Insufficient documentation

## 2016-03-06 DIAGNOSIS — G4733 Obstructive sleep apnea (adult) (pediatric): Secondary | ICD-10-CM | POA: Insufficient documentation

## 2016-03-06 DIAGNOSIS — E785 Hyperlipidemia, unspecified: Secondary | ICD-10-CM | POA: Diagnosis not present

## 2016-03-06 DIAGNOSIS — J383 Other diseases of vocal cords: Secondary | ICD-10-CM | POA: Diagnosis present

## 2016-03-06 HISTORY — PX: MINOR EXCISION OF ORAL LESION: SHX6466

## 2016-03-06 HISTORY — DX: Presence of dental prosthetic device (complete) (partial): Z97.2

## 2016-03-06 SURGERY — MINOR EXCISION OF ORAL LESION
Anesthesia: General | Laterality: Left | Wound class: Clean Contaminated

## 2016-03-06 MED ORDER — SUCCINYLCHOLINE CHLORIDE 20 MG/ML IJ SOLN
INTRAMUSCULAR | Status: DC | PRN
  Administered 2016-03-06: 100 mg via INTRAVENOUS

## 2016-03-06 MED ORDER — ONDANSETRON HCL 4 MG/2ML IJ SOLN
INTRAMUSCULAR | Status: DC | PRN
  Administered 2016-03-06: 4 mg via INTRAVENOUS

## 2016-03-06 MED ORDER — PROPOFOL 10 MG/ML IV BOLUS
INTRAVENOUS | Status: DC | PRN
  Administered 2016-03-06: 170 mg via INTRAVENOUS

## 2016-03-06 MED ORDER — LIDOCAINE HCL 1 % IJ SOLN
INTRAMUSCULAR | Status: DC | PRN
  Administered 2016-03-06: 3 mL via TOPICAL

## 2016-03-06 MED ORDER — ONDANSETRON HCL 4 MG/2ML IJ SOLN
4.0000 mg | Freq: Once | INTRAMUSCULAR | Status: DC | PRN
Start: 2016-03-06 — End: 2016-03-06

## 2016-03-06 MED ORDER — ACETAMINOPHEN 10 MG/ML IV SOLN
1000.0000 mg | Freq: Once | INTRAVENOUS | Status: AC
  Administered 2016-03-06: 1000 mg via INTRAVENOUS

## 2016-03-06 MED ORDER — FENTANYL CITRATE (PF) 100 MCG/2ML IJ SOLN: 25.0000 ug | INTRAMUSCULAR | Status: DC | PRN

## 2016-03-06 MED ORDER — LACTATED RINGERS IV SOLN
INTRAVENOUS | Status: DC
  Administered 2016-03-06: 09:00:00 via INTRAVENOUS

## 2016-03-06 MED ORDER — DEXAMETHASONE SODIUM PHOSPHATE 4 MG/ML IJ SOLN
INTRAMUSCULAR | Status: DC | PRN
  Administered 2016-03-06: 10 mg via INTRAVENOUS

## 2016-03-06 MED ORDER — GLYCOPYRROLATE 0.2 MG/ML IJ SOLN
INTRAMUSCULAR | Status: DC | PRN
  Administered 2016-03-06: 0.1 mg via INTRAVENOUS

## 2016-03-06 MED ORDER — FENTANYL CITRATE (PF) 100 MCG/2ML IJ SOLN
INTRAMUSCULAR | Status: DC | PRN
  Administered 2016-03-06: 50 ug via INTRAVENOUS

## 2016-03-06 MED ORDER — OXYCODONE HCL 5 MG PO TABS: 5.0000 mg | ORAL_TABLET | Freq: Once | ORAL | Status: DC | PRN

## 2016-03-06 MED ORDER — LIDOCAINE HCL (CARDIAC) 20 MG/ML IV SOLN
INTRAVENOUS | Status: DC | PRN
  Administered 2016-03-06: 40 mg via INTRAVENOUS

## 2016-03-06 MED ORDER — MIDAZOLAM HCL 5 MG/5ML IJ SOLN
INTRAMUSCULAR | Status: DC | PRN
  Administered 2016-03-06: 2 mg via INTRAVENOUS

## 2016-03-06 MED ORDER — OXYCODONE HCL 5 MG/5ML PO SOLN: 5.0000 mg | Freq: Once | ORAL | Status: DC | PRN

## 2016-03-06 SURGICAL SUPPLY — 22 items
BASIN GRAD PLASTIC 32OZ STRL (MISCELLANEOUS) IMPLANT
BLOCK BITE GUARD (MISCELLANEOUS) ×2 IMPLANT
COVER MAYO STAND STRL (DRAPES) ×2 IMPLANT
COVER TABLE BACK 60X90 (DRAPES) ×2 IMPLANT
CUP MEDICINE 2OZ PLAST GRAD ST (MISCELLANEOUS) IMPLANT
DRAPE SHEET LG 3/4 BI-LAMINATE (DRAPES) ×2 IMPLANT
DRESSING TELFA 4X3 1S ST N-ADH (GAUZE/BANDAGES/DRESSINGS) ×2 IMPLANT
GLOVE PI ULTRA LF STRL 7.5 (GLOVE) ×1 IMPLANT
GLOVE PI ULTRA NON LATEX 7.5 (GLOVE) ×1
KIT ROOM TURNOVER OR (KITS) ×2 IMPLANT
MARKER SKIN DUAL TIP RULER LAB (MISCELLANEOUS) IMPLANT
NEEDLE 18GX1X1/2 (RX/OR ONLY) (NEEDLE) IMPLANT
NEEDLE FILTER BLUNT 18X 1/2SAF (NEEDLE)
NEEDLE FILTER BLUNT 18X1 1/2 (NEEDLE) IMPLANT
NS IRRIG 500ML POUR BTL (IV SOLUTION) IMPLANT
PATTIES SURGICAL .5 X.5 (GAUZE/BANDAGES/DRESSINGS) ×2 IMPLANT
SPONGE XRAY 4X4 16PLY STRL (MISCELLANEOUS) ×2 IMPLANT
STRAP BODY AND KNEE 60X3 (MISCELLANEOUS) ×2 IMPLANT
SYRINGE 10CC LL (SYRINGE) IMPLANT
TOWEL OR 17X26 4PK STRL BLUE (TOWEL DISPOSABLE) ×2 IMPLANT
TUBING CONN 6MMX3.1M (TUBING) ×1
TUBING SUCTION CONN 0.25 STRL (TUBING) ×1 IMPLANT

## 2016-03-06 NOTE — Anesthesia Postprocedure Evaluation (Signed)
Anesthesia Post Note  Patient: Kirk Rodriguez  Procedure(s) Performed: Procedure(s) (LRB): direct microlaryngoscopy with excision left vocal cord lesion (Left)  Patient location during evaluation: PACU Anesthesia Type: General Level of consciousness: awake and alert and oriented Pain management: satisfactory to patient Vital Signs Assessment: post-procedure vital signs reviewed and stable Respiratory status: spontaneous breathing, nonlabored ventilation and respiratory function stable Cardiovascular status: blood pressure returned to baseline and stable Postop Assessment: Adequate PO intake and No signs of nausea or vomiting Anesthetic complications: no    Raliegh Ip

## 2016-03-06 NOTE — Anesthesia Procedure Notes (Addendum)
Procedure Name: Intubation Date/Time: 03/06/2016 9:16 AM Performed by: Mayme Genta Pre-anesthesia Checklist: Patient identified, Emergency Drugs available, Suction available, Patient being monitored and Timeout performed Patient Re-evaluated:Patient Re-evaluated prior to inductionOxygen Delivery Method: Circle system utilized Preoxygenation: Pre-oxygenation with 100% oxygen Intubation Type: IV induction Ventilation: Mask ventilation without difficulty Laryngoscope Size: Miller and 3 Grade View: Grade I Tube type: MLT Tube size: 6.0 mm Number of attempts: 1 Placement Confirmation: ETT inserted through vocal cords under direct vision,  positive ETCO2 and breath sounds checked- equal and bilateral Secured at: 23 cm Tube secured with: Tape Dental Injury: Teeth and Oropharynx as per pre-operative assessment

## 2016-03-06 NOTE — Transfer of Care (Signed)
Immediate Anesthesia Transfer of Care Note  Patient: Kirk Rodriguez  Procedure(s) Performed: Procedure(s) with comments: direct microlaryngoscopy with excision left vocal cord lesion (Left) - left vocal cord lesion  Patient Location: PACU  Anesthesia Type: General ETT  Level of Consciousness: awake, alert  and patient cooperative  Airway and Oxygen Therapy: Patient Spontanous Breathing and Patient connected to supplemental oxygen  Post-op Assessment: Post-op Vital signs reviewed, Patient's Cardiovascular Status Stable, Respiratory Function Stable, Patent Airway and No signs of Nausea or vomiting  Post-op Vital Signs: Reviewed and stable  Complications: No apparent anesthesia complications

## 2016-03-06 NOTE — Op Note (Signed)
03/06/2016  9:53 AM    Arsenio Loader  [id number]   Pre-Op Dx:  Bilateral vocal cord lesions/leukoplakia  Post-op Dx: Same  Proc: Direct microlaryngoscopy with excision of left vocal cord lesion   Surg:  Trek Kimball H  Anes:  GOT  EBL:  Minimal  Comp:  None  Findings:  Leukoplakia involving both cords as pictured in the chart. The left vocal cord leukoplakia projected into the airway more of this side was removed. Both sides were not removed to help prevent anterior webbing.  Procedure: The patient was given general anesthesia by oral endotracheal intubation, lidocaine in a supine position. A Dedo laryngoscope was used for visualization of the hypopharynx and larynx. The epiglottis was normal in the arytenoids looked normal as well. The vallecula and piriform sinuses were clear. The vocal cords were visualized see leukoplakia at the anterior cords. A Louis arm was brought in for stabilization of the scope. High-power magnification was then brought in for visualization of this and pictures were taken.  Under magnification the lesion on the left anterior cord was removed. There was further leukoplakia on the dorsal surface of the left cord into the ventricle. Some of this was stripped as well. There is more leukoplakia posterior that was stripped off as well. The underlying muscle was left intact as much possible. Vasoconstriction was obtained using phenylephrine-soaked cottonoid pledgets that were placed on the cord. Once is removed there is no further bleeding and the cord appeared to be clean. The muscle fibers were intact and visible. A picture was taken of this for documentation.  Dispo:   The patient was awakened and taken to the recovery room in satisfactory condition. There were no operative complications. He was to be discharged home from the PACU.  Plan:  To rest at home with voice rest for 1 week. We'll follow-up in 10 days to go over the pathology report and make sure he is  doing well. We will consider coming back to remove the rest of leukoplakia on the right cord in 1 month.  Janalee Grobe H  03/06/2016 9:53 AM

## 2016-03-06 NOTE — H&P (Signed)
H&P has been reviewed and patient examined, and no changes necessary. To be downloaded later.

## 2016-03-06 NOTE — Anesthesia Preprocedure Evaluation (Signed)
Anesthesia Evaluation  Patient identified by MRN, date of birth, ID band Patient awake    Reviewed: Allergy & Precautions, H&P , NPO status , Patient's Chart, lab work & pertinent test results  Airway Mallampati: II  TM Distance: >3 FB Neck ROM: full    Dental no notable dental hx. (+) Upper Dentures, Partial Lower   Pulmonary COPD, Current Smoker,    Pulmonary exam normal        Cardiovascular hypertension, Normal cardiovascular exam     Neuro/Psych    GI/Hepatic   Endo/Other    Renal/GU      Musculoskeletal   Abdominal   Peds  Hematology   Anesthesia Other Findings   Reproductive/Obstetrics                             Anesthesia Physical  Anesthesia Plan  ASA: II  Anesthesia Plan: General ETT   Post-op Pain Management:    Induction:   Airway Management Planned:   Additional Equipment:   Intra-op Plan:   Post-operative Plan:   Informed Consent: I have reviewed the patients History and Physical, chart, labs and discussed the procedure including the risks, benefits and alternatives for the proposed anesthesia with the patient or authorized representative who has indicated his/her understanding and acceptance.     Plan Discussed with:   Anesthesia Plan Comments:         Anesthesia Quick Evaluation  

## 2016-03-07 ENCOUNTER — Encounter: Payer: Self-pay | Admitting: Otolaryngology

## 2016-03-10 LAB — SURGICAL PATHOLOGY

## 2016-03-31 ENCOUNTER — Encounter: Payer: Self-pay | Admitting: *Deleted

## 2016-04-01 NOTE — Discharge Instructions (Signed)

## 2016-04-03 ENCOUNTER — Ambulatory Visit: Payer: Medicare Other | Admitting: Anesthesiology

## 2016-04-03 ENCOUNTER — Encounter
Admission: RE | Disposition: A | Discharge status: Home / Self Care | Payer: Self-pay | Source: Ambulatory Visit | Attending: Otolaryngology

## 2016-04-03 ENCOUNTER — Ambulatory Visit
Admission: RE | Admit: 2016-04-03 | Discharge: 2016-04-03 | Disposition: A | Discharge status: Home / Self Care | Payer: Medicare Other | Source: Ambulatory Visit | Attending: Otolaryngology | Admitting: Otolaryngology

## 2016-04-03 DIAGNOSIS — D020 Carcinoma in situ of larynx: Secondary | ICD-10-CM | POA: Diagnosis not present

## 2016-04-03 DIAGNOSIS — Z7982 Long term (current) use of aspirin: Secondary | ICD-10-CM | POA: Diagnosis not present

## 2016-04-03 DIAGNOSIS — I10 Essential (primary) hypertension: Secondary | ICD-10-CM | POA: Insufficient documentation

## 2016-04-03 DIAGNOSIS — J383 Other diseases of vocal cords: Secondary | ICD-10-CM | POA: Diagnosis present

## 2016-04-03 DIAGNOSIS — E7800 Pure hypercholesterolemia, unspecified: Secondary | ICD-10-CM | POA: Diagnosis not present

## 2016-04-03 DIAGNOSIS — Z79899 Other long term (current) drug therapy: Secondary | ICD-10-CM | POA: Insufficient documentation

## 2016-04-03 DIAGNOSIS — E785 Hyperlipidemia, unspecified: Secondary | ICD-10-CM | POA: Insufficient documentation

## 2016-04-03 DIAGNOSIS — F17200 Nicotine dependence, unspecified, uncomplicated: Secondary | ICD-10-CM | POA: Insufficient documentation

## 2016-04-03 DIAGNOSIS — J449 Chronic obstructive pulmonary disease, unspecified: Secondary | ICD-10-CM | POA: Diagnosis not present

## 2016-04-03 HISTORY — PX: DIRECT LARYNGOSCOPY: SHX5326

## 2016-04-03 SURGERY — LARYNGOSCOPY, DIRECT
Anesthesia: General | Site: Throat | Laterality: Right | Wound class: Clean Contaminated

## 2016-04-03 MED ORDER — PHENYLEPHRINE HCL 0.5 % NA SOLN
NASAL | Status: DC | PRN
  Administered 2016-04-03: 3 mL via TOPICAL

## 2016-04-03 MED ORDER — LIDOCAINE HCL (CARDIAC) 20 MG/ML IV SOLN
INTRAVENOUS | Status: DC | PRN
  Administered 2016-04-03: 50 mg via INTRAVENOUS

## 2016-04-03 MED ORDER — ONDANSETRON HCL 4 MG/2ML IJ SOLN: 4.0000 mg | Freq: Once | INTRAMUSCULAR | Status: DC | PRN

## 2016-04-03 MED ORDER — LIDOCAINE HCL 4 % MT SOLN
OROMUCOSAL | Status: DC | PRN
  Administered 2016-04-03: 4 mL via TOPICAL

## 2016-04-03 MED ORDER — OXYCODONE HCL 5 MG/5ML PO SOLN: 5.0000 mg | Freq: Once | ORAL | Status: DC | PRN

## 2016-04-03 MED ORDER — ACETAMINOPHEN 160 MG/5ML PO SOLN: 325.0000 mg | ORAL | Status: DC | PRN

## 2016-04-03 MED ORDER — GLYCOPYRROLATE 0.2 MG/ML IJ SOLN
INTRAMUSCULAR | Status: DC | PRN
  Administered 2016-04-03: 0.2 mg via INTRAVENOUS

## 2016-04-03 MED ORDER — FENTANYL CITRATE (PF) 100 MCG/2ML IJ SOLN: 25.0000 ug | INTRAMUSCULAR | Status: DC | PRN

## 2016-04-03 MED ORDER — ACETAMINOPHEN 325 MG PO TABS: 325.0000 mg | ORAL_TABLET | ORAL | Status: DC | PRN

## 2016-04-03 MED ORDER — FENTANYL CITRATE (PF) 100 MCG/2ML IJ SOLN
INTRAMUSCULAR | Status: DC | PRN
  Administered 2016-04-03: 100 ug via INTRAVENOUS

## 2016-04-03 MED ORDER — ONDANSETRON HCL 4 MG/2ML IJ SOLN
INTRAMUSCULAR | Status: DC | PRN
  Administered 2016-04-03: 4 mg via INTRAVENOUS

## 2016-04-03 MED ORDER — SUCCINYLCHOLINE CHLORIDE 20 MG/ML IJ SOLN
INTRAMUSCULAR | Status: DC | PRN
  Administered 2016-04-03: 100 mg via INTRAVENOUS

## 2016-04-03 MED ORDER — OXYCODONE HCL 5 MG PO TABS: 5.0000 mg | ORAL_TABLET | Freq: Once | ORAL | Status: DC | PRN

## 2016-04-03 MED ORDER — PROPOFOL 10 MG/ML IV BOLUS
INTRAVENOUS | Status: DC | PRN
  Administered 2016-04-03: 150 mg via INTRAVENOUS

## 2016-04-03 MED ORDER — DEXAMETHASONE SODIUM PHOSPHATE 4 MG/ML IJ SOLN
INTRAMUSCULAR | Status: DC | PRN
  Administered 2016-04-03: 8 mg via INTRAVENOUS

## 2016-04-03 MED ORDER — MIDAZOLAM HCL 5 MG/5ML IJ SOLN
INTRAMUSCULAR | Status: DC | PRN
  Administered 2016-04-03: 2 mg via INTRAVENOUS

## 2016-04-03 MED ORDER — LACTATED RINGERS IV SOLN
INTRAVENOUS | Status: DC
  Administered 2016-04-03: 08:00:00 via INTRAVENOUS

## 2016-04-03 SURGICAL SUPPLY — 22 items
BASIN GRAD PLASTIC 32OZ STRL (MISCELLANEOUS) IMPLANT
BLOCK BITE GUARD (MISCELLANEOUS) ×2 IMPLANT
COVER MAYO STAND STRL (DRAPES) ×2 IMPLANT
COVER TABLE BACK 60X90 (DRAPES) ×2 IMPLANT
CUP MEDICINE 2OZ PLAST GRAD ST (MISCELLANEOUS) IMPLANT
DRAPE SHEET LG 3/4 BI-LAMINATE (DRAPES) ×2 IMPLANT
DRESSING TELFA 4X3 1S ST N-ADH (GAUZE/BANDAGES/DRESSINGS) ×2 IMPLANT
GLOVE PI ULTRA LF STRL 7.5 (GLOVE) ×1 IMPLANT
GLOVE PI ULTRA NON LATEX 7.5 (GLOVE) ×1
KIT ROOM TURNOVER OR (KITS) ×2 IMPLANT
MARKER SKIN DUAL TIP RULER LAB (MISCELLANEOUS) IMPLANT
NEEDLE 18GX1X1/2 (RX/OR ONLY) (NEEDLE) IMPLANT
NEEDLE FILTER BLUNT 18X 1/2SAF (NEEDLE)
NEEDLE FILTER BLUNT 18X1 1/2 (NEEDLE) IMPLANT
NS IRRIG 500ML POUR BTL (IV SOLUTION) IMPLANT
PATTIES SURGICAL .5 X.5 (GAUZE/BANDAGES/DRESSINGS) ×2 IMPLANT
SPONGE XRAY 4X4 16PLY STRL (MISCELLANEOUS) ×2 IMPLANT
STRAP BODY AND KNEE 60X3 (MISCELLANEOUS) ×2 IMPLANT
SYRINGE 10CC LL (SYRINGE) IMPLANT
TOWEL OR 17X26 4PK STRL BLUE (TOWEL DISPOSABLE) ×2 IMPLANT
TUBING CONN 6MMX3.1M (TUBING) ×1
TUBING SUCTION CONN 0.25 STRL (TUBING) ×1 IMPLANT

## 2016-04-03 NOTE — H&P (Signed)
H&P has been reviewed and the patient reexamined, and no changes necessary. To be downloaded later.

## 2016-04-03 NOTE — Op Note (Signed)
04/03/2016  10:38 AM    Kirk Rodriguez  [id number]   Pre-Op Dx:  Right vocal cord lesion  Post-op Dx: Same  Proc: Rectal microlaryngoscopy and excision right vocal cord lesion   Surg:  Shifa Brisbon H  Anes:  GOT  EBL:  Minimal  Comp:  None  Findings:  Area of leukoplakia on the right anterior cord. His left cord looked normal down is well-healed from stripping of this 1 month ago.  Procedure: The patient was given general anesthesia by oral endotracheal intubation. A small tube was used to able visualize around this. A Dedo laryngoscope was used for visualization of the hypopharynx and larynx. No lesions of the hypopharynx noted. The glottis was lifted and the scope was put into the laryngeal inlet to visualize the true cords. A Louis arm was brought in for stabilization. A endoscope was used for magnification and visualization of the true cords. A picture was taken of this to show the leukoplakia on the right true cord. The left true cord looked very healthy and it healed well from previous surgery.  A cottonoid pledget soaked in phenylephrine 1:1000 was placed onto the left true cord for vasoconstriction. This was allowed to sit for a couple minutes. This was removed. Under magnification the leukoplakia on the left cord was grasped pulled medially. An up-biting scissors was used for incising mucosa posterior to it and then cutting along the superior border and inferior border to remove the entire piece leukoplakia. Muscle was left intact and was not injured. The rest the mucosa looked very healthy and did not need to be removed.  The patient was then awakened and taken to the recovery room in satisfactory condition there were no operative complications. Pictures were taken from preop and postop.  Dispo:   To PACU to be discharged home  Plan:  Voice rest for 1 week. We will see him at that time to make sure nothing is healing well. I encouraged him to stop smoking.  [name]  H  04/03/2016 10:38 AM

## 2016-04-03 NOTE — Anesthesia Procedure Notes (Signed)
Procedure Name: Intubation Date/Time: 04/03/2016 10:24 AM Performed by: ,  Pre-anesthesia Checklist: Patient identified, Emergency Drugs available, Suction available, Patient being monitored and Timeout performed Patient Re-evaluated:Patient Re-evaluated prior to inductionOxygen Delivery Method: Circle system utilized Preoxygenation: Pre-oxygenation with 100% oxygen Intubation Type: IV induction Ventilation: Mask ventilation without difficulty Laryngoscope Size: Mac and 3 Grade View: Grade I Tube type: MLT Tube size: 6.0 mm Number of attempts: 1 Airway Equipment and Method: LTA kit utilized Placement Confirmation: ETT inserted through vocal cords under direct vision,  positive ETCO2 and breath sounds checked- equal and bilateral Tube secured with: Tape Dental Injury: Teeth and Oropharynx as per pre-operative assessment        

## 2016-04-03 NOTE — Anesthesia Postprocedure Evaluation (Signed)
Anesthesia Post Note  Patient: Kirk Rodriguez  Procedure(s) Performed: Procedure(s) (LRB): DIRECT MICROLARYNGOSCOPY WITH EXCISON RIGHT VOCAL CORD LESION (Right)  Patient location during evaluation: PACU Anesthesia Type: General Level of consciousness: awake and alert and oriented Pain management: satisfactory to patient Vital Signs Assessment: post-procedure vital signs reviewed and stable Respiratory status: spontaneous breathing, nonlabored ventilation and respiratory function stable Cardiovascular status: blood pressure returned to baseline and stable Postop Assessment: Adequate PO intake and No signs of nausea or vomiting Anesthetic complications: no    Raliegh Ip

## 2016-04-03 NOTE — Transfer of Care (Signed)
Immediate Anesthesia Transfer of Care Note  Patient: Kirk Rodriguez  Procedure(s) Performed: Procedure(s) with comments: DIRECT MICROLARYNGOSCOPY WITH EXCISON RIGHT VOCAL CORD LESION (Right) - RIGHT   Patient Location: PACU  Anesthesia Type: General ETT  Level of Consciousness: awake, alert  and patient cooperative  Airway and Oxygen Therapy: Patient Spontanous Breathing and Patient connected to supplemental oxygen  Post-op Assessment: Post-op Vital signs reviewed, Patient's Cardiovascular Status Stable, Respiratory Function Stable, Patent Airway and No signs of Nausea or vomiting  Post-op Vital Signs: Reviewed and stable  Complications: No apparent anesthesia complications

## 2016-04-03 NOTE — Anesthesia Preprocedure Evaluation (Signed)
Anesthesia Evaluation  Patient identified by MRN, date of birth, ID band Patient awake    Reviewed: Allergy & Precautions, H&P , NPO status , Patient's Chart, lab work & pertinent test results  Airway Mallampati: II  TM Distance: >3 FB Neck ROM: full    Dental no notable dental hx. (+) Upper Dentures, Partial Lower   Pulmonary COPD, Current Smoker,    Pulmonary exam normal        Cardiovascular hypertension, Normal cardiovascular exam     Neuro/Psych    GI/Hepatic   Endo/Other    Renal/GU      Musculoskeletal   Abdominal   Peds  Hematology   Anesthesia Other Findings   Reproductive/Obstetrics                             Anesthesia Physical  Anesthesia Plan  ASA: II  Anesthesia Plan: General ETT   Post-op Pain Management:    Induction:   Airway Management Planned:   Additional Equipment:   Intra-op Plan:   Post-operative Plan:   Informed Consent: I have reviewed the patients History and Physical, chart, labs and discussed the procedure including the risks, benefits and alternatives for the proposed anesthesia with the patient or authorized representative who has indicated his/her understanding and acceptance.     Plan Discussed with:   Anesthesia Plan Comments:         Anesthesia Quick Evaluation

## 2016-04-07 LAB — SURGICAL PATHOLOGY

## 2016-04-23 ENCOUNTER — Ambulatory Visit
Admission: RE | Admit: 2016-04-23 | Discharge: 2016-04-23 | Disposition: A | Discharge status: Home / Self Care | Payer: Medicare Other | Source: Ambulatory Visit | Attending: Radiation Oncology | Admitting: Radiation Oncology

## 2016-04-23 ENCOUNTER — Encounter: Payer: Self-pay | Admitting: Radiation Oncology

## 2016-04-23 ENCOUNTER — Other Ambulatory Visit: Payer: Self-pay | Admitting: *Deleted

## 2016-04-23 VITALS — BP 112/71 | HR 77 | Temp 97.4°F | Resp 20 | Ht 72.0 in | Wt 177.8 lb

## 2016-04-23 DIAGNOSIS — F801 Expressive language disorder: Secondary | ICD-10-CM | POA: Insufficient documentation

## 2016-04-23 DIAGNOSIS — E785 Hyperlipidemia, unspecified: Secondary | ICD-10-CM | POA: Insufficient documentation

## 2016-04-23 DIAGNOSIS — Z79899 Other long term (current) drug therapy: Secondary | ICD-10-CM | POA: Insufficient documentation

## 2016-04-23 DIAGNOSIS — R490 Dysphonia: Secondary | ICD-10-CM | POA: Diagnosis not present

## 2016-04-23 DIAGNOSIS — F17210 Nicotine dependence, cigarettes, uncomplicated: Secondary | ICD-10-CM | POA: Insufficient documentation

## 2016-04-23 DIAGNOSIS — Z510 Encounter for antineoplastic radiation therapy: Secondary | ICD-10-CM | POA: Insufficient documentation

## 2016-04-23 DIAGNOSIS — Z7982 Long term (current) use of aspirin: Secondary | ICD-10-CM | POA: Diagnosis not present

## 2016-04-23 DIAGNOSIS — D020 Carcinoma in situ of larynx: Secondary | ICD-10-CM | POA: Diagnosis not present

## 2016-04-23 HISTORY — DX: Malignant (primary) neoplasm, unspecified: C80.1

## 2016-04-23 NOTE — Consult Note (Signed)
NEW PATIENT EVALUATION  Name: Kirk Rodriguez  MRN: [id number]  Date:   04/23/2016     DOB: [birth year]   This 71 y.o. 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  REFERRING PHYSICIAN: Sherrin Daisy, MD  CHIEF COMPLAINT:  Chief Complaint  Patient presents with  . Cancer    Pt is here for initial consult of laryngeal cancer.      DIAGNOSIS: The encounter diagnosis was Carcinoma in situ of larynx.   PREVIOUS INVESTIGATIONS:  CT scan of the head and neck ordered Pathology reports reviewed Operative Notes and clinical notes reviewed  HPI: Patient is a 71 year old male evaluated by ENT for persistent hoarseness. 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 He underwent 2 procedures both stripping of vocal cords. Left vocal cord shows severe squamous dysplasia (carcinoma in situ. Right vocal cord showed against severe squamous dysplasia's carcinoma in situ with keratosis with margins involved. He really is doing well otherwise specifically denies dysphagia or head and neck pain. His voice is reasonable although he says persistently throughout his adult life's been hoarse. I been asked to evaluate the patient for possibility of radiation therapy.  PLANNED TREATMENT REGIMEN: Radiation therapy to larynx  PAST MEDICAL HISTORY:  has a past medical history of Allergy; Cancer ([place]); Hyperlipidemia; and Wears dentures.    PAST SURGICAL HISTORY:  Past Surgical History:  Procedure Laterality Date  . DIRECT LARYNGOSCOPY Right 04/03/2016   Procedure: DIRECT MICROLARYNGOSCOPY WITH EXCISON RIGHT VOCAL CORD LESION;  Surgeon: Margaretha Sheffield, MD;  Location: [place];  Service: ENT;  Laterality: Right;  RIGHT   . INGUINAL HERNIA REPAIR Bilateral 12/18/2015   Procedure: LAPAROSCOPIC BILATERAL INGUINAL HERNIA REPAIR;  Surgeon: Hubbard Robinson, MD;  Location: [facility];  Service: General;   Laterality: Bilateral;  . MINOR EXCISION OF ORAL LESION Left 03/06/2016   Procedure: direct microlaryngoscopy with excision left vocal cord lesion;  Surgeon: Margaretha Sheffield, MD;  Location: [place];  Service: ENT;  Laterality: Left;  left vocal cord lesion  . TRIGGER FINGER RELEASE Left 2016  . VASECTOMY Bilateral 1978    FAMILY HISTORY: family history includes Heart disease in his father and mother; Hypertension in his father; Lung cancer in his father; Polycythemia in his mother.  SOCIAL HISTORY:  reports that he quit smoking 6 days ago. His smoking use included Cigarettes. He has a 25.00 pack-year smoking history. He has never used smokeless tobacco. He reports that he drinks alcohol. He reports that he does not use drugs.  ALLERGIES: Patient has no known allergies.  MEDICATIONS:  Current Outpatient Prescriptions  Medication Sig Dispense Refill  . aspirin EC 81 MG tablet Take 81 mg by mouth daily.     . Cholecalciferol (VITAMIN D3) 2000 units capsule Take 2,000 Units by mouth daily.     . Multiple Vitamin (MULTI-VITAMINS) TABS Take 1 tablet by mouth daily.     . simvastatin (ZOCOR) 80 MG tablet Take 80 mg by mouth daily at 6 PM.     . triamcinolone (NASACORT AQ) 55 MCG/ACT AERO nasal inhaler Place 2 sprays into the nose daily.     No current facility-administered medications for this encounter.     ECOG PERFORMANCE STATUS:  0 - Asymptomatic  REVIEW OF SYSTEMS:  Patient denies any weight loss, fatigue, weakness, fever, chills or night sweats. Patient denies any loss of vision, blurred vision. Patient denies any  ringing  of the ears or hearing loss. No irregular heartbeat. Patient denies heart murmur or history of fainting. Patient denies any chest pain or pain radiating to her upper extremities. Patient denies any shortness of breath, difficulty breathing at night, cough or hemoptysis. Patient denies any swelling in the lower legs. Patient denies any nausea vomiting, vomiting of  blood, or coffee ground material in the vomitus. Patient denies any stomach pain. Patient states has had normal bowel movements no significant constipation or diarrhea. Patient denies any dysuria, hematuria or significant nocturia. Patient denies any problems walking, swelling in the joints or loss of balance. Patient denies any skin changes, loss of hair or loss of weight. Patient denies any excessive worrying or anxiety or significant depression. Patient denies any problems with insomnia. Patient denies excessive thirst, polyuria, polydipsia. Patient denies any swollen glands, patient denies easy bruising or easy bleeding. Patient denies any recent infections, allergies or URI. Patient "s visual fields have not changed significantly in recent time.    PHYSICAL EXAM: BP 112/71   Pulse 77   Temp 97.4 F (36.3 C)   Resp 20   Ht 6' (1.829 m)   Wt 177 lb 12.8 oz (80.7 kg)   BMI 24.11 kg/m  Oral cavity is clear teeth are in good state of repair. Indirect mirror examination shows leukoplakia in the larynx greater on the left than the right. Upper airways clear vallecula and base of tongue appeared within normal limits. Neck is clear without evidence of subject gastric cervical or supraclavicular adenopathy. Well-developed well-nourished patient in NAD. HEENT reveals PERLA, EOMI, discs not visualized.  Oral cavity is clear. No oral mucosal lesions are identified. Neck is clear without evidence of cervical or supraclavicular adenopathy. Lungs are clear to A&P. Cardiac examination is essentially unremarkable with regular rate and rhythm without murmur rub or thrill. Abdomen is benign with no organomegaly or masses noted. Motor sensory and DTR levels are equal and symmetric in the upper and lower extremities. Cranial nerves II through XII are grossly intact. Proprioception is intact. No peripheral adenopathy or edema is identified. No motor or sensory levels are noted. Crude visual fields are within normal  range.  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 Based on the lease carcinoma in situ bilaterally with positive margins would recommend radiation therapy. Would plan on delivering 6600 cGy in 23 fractions to his larynx. Risks and benefits of treatment including increased hoarseness possible dysphasia skin reaction fatigue alteration of blood counts all were discussed in detail with the patient and his wife. They both seem to comprehend my treatment plan well. I personally ordered and scheduled CT simulation after his CT scan. Patient wife compress my treatment plan well. Case was discussed with ENT.  I would like to take this opportunity to thank you for allowing me to participate in the care of your patient.Armstead Peaks., MD

## 2016-04-29 ENCOUNTER — Ambulatory Visit
Admission: RE | Admit: 2016-04-29 | Discharge: 2016-04-29 | Disposition: A | Discharge status: Home / Self Care | Payer: Medicare Other | Source: Ambulatory Visit | Attending: Radiation Oncology | Admitting: Radiation Oncology

## 2016-04-29 DIAGNOSIS — J439 Emphysema, unspecified: Secondary | ICD-10-CM | POA: Insufficient documentation

## 2016-04-29 DIAGNOSIS — I700 Atherosclerosis of aorta: Secondary | ICD-10-CM | POA: Diagnosis not present

## 2016-04-29 DIAGNOSIS — D020 Carcinoma in situ of larynx: Secondary | ICD-10-CM | POA: Diagnosis not present

## 2016-04-29 LAB — POCT I-STAT CREATININE: Creatinine, Ser: 1.1 mg/dL (ref 0.61–1.24)

## 2016-04-29 MED ORDER — IOPAMIDOL (ISOVUE-300) INJECTION 61%
75.0000 mL | Freq: Once | INTRAVENOUS | Status: AC | PRN
  Administered 2016-04-29: 75 mL via INTRAVENOUS

## 2016-05-01 DIAGNOSIS — Z87891 Personal history of nicotine dependence: Secondary | ICD-10-CM | POA: Insufficient documentation

## 2016-05-02 ENCOUNTER — Other Ambulatory Visit: Payer: Self-pay | Admitting: Pediatrics

## 2016-05-02 DIAGNOSIS — Z87891 Personal history of nicotine dependence: Secondary | ICD-10-CM

## 2016-05-05 ENCOUNTER — Ambulatory Visit
Admission: RE | Admit: 2016-05-05 | Discharge: 2016-05-05 | Disposition: A | Discharge status: Home / Self Care | Payer: Medicare Other | Source: Ambulatory Visit | Attending: Radiation Oncology | Admitting: Radiation Oncology

## 2016-05-05 DIAGNOSIS — D020 Carcinoma in situ of larynx: Secondary | ICD-10-CM | POA: Diagnosis not present

## 2016-05-06 DIAGNOSIS — D020 Carcinoma in situ of larynx: Secondary | ICD-10-CM | POA: Diagnosis not present

## 2016-05-07 ENCOUNTER — Ambulatory Visit: Payer: Medicare Other

## 2016-05-09 ENCOUNTER — Other Ambulatory Visit: Payer: Self-pay | Admitting: *Deleted

## 2016-05-09 DIAGNOSIS — D020 Carcinoma in situ of larynx: Secondary | ICD-10-CM

## 2016-05-12 ENCOUNTER — Ambulatory Visit
Admission: RE | Admit: 2016-05-12 | Discharge: 2016-05-12 | Disposition: A | Discharge status: Home / Self Care | Payer: Medicare Other | Source: Ambulatory Visit | Attending: Radiation Oncology | Admitting: Radiation Oncology

## 2016-05-12 DIAGNOSIS — D020 Carcinoma in situ of larynx: Secondary | ICD-10-CM | POA: Diagnosis not present

## 2016-05-13 ENCOUNTER — Ambulatory Visit
Admission: RE | Admit: 2016-05-13 | Discharge: 2016-05-13 | Disposition: A | Discharge status: Home / Self Care | Payer: Medicare Other | Source: Ambulatory Visit | Attending: Radiation Oncology | Admitting: Radiation Oncology

## 2016-05-13 ENCOUNTER — Inpatient Hospital Stay: Payer: Medicare Other

## 2016-05-13 DIAGNOSIS — D020 Carcinoma in situ of larynx: Secondary | ICD-10-CM | POA: Diagnosis not present

## 2016-05-14 ENCOUNTER — Ambulatory Visit
Admission: RE | Admit: 2016-05-14 | Discharge: 2016-05-14 | Disposition: A | Discharge status: Home / Self Care | Payer: Medicare Other | Source: Ambulatory Visit | Attending: Radiation Oncology | Admitting: Radiation Oncology

## 2016-05-14 DIAGNOSIS — D020 Carcinoma in situ of larynx: Secondary | ICD-10-CM | POA: Diagnosis not present

## 2016-05-15 ENCOUNTER — Ambulatory Visit
Admission: RE | Admit: 2016-05-15 | Discharge: 2016-05-15 | Disposition: A | Discharge status: Home / Self Care | Payer: Medicare Other | Source: Ambulatory Visit | Attending: Radiation Oncology | Admitting: Radiation Oncology

## 2016-05-15 DIAGNOSIS — D020 Carcinoma in situ of larynx: Secondary | ICD-10-CM | POA: Diagnosis not present

## 2016-05-16 ENCOUNTER — Ambulatory Visit
Admission: RE | Admit: 2016-05-16 | Discharge: 2016-05-16 | Disposition: A | Discharge status: Home / Self Care | Payer: Medicare Other | Source: Ambulatory Visit | Attending: Radiation Oncology | Admitting: Radiation Oncology

## 2016-05-16 DIAGNOSIS — D020 Carcinoma in situ of larynx: Secondary | ICD-10-CM | POA: Diagnosis not present

## 2016-05-19 ENCOUNTER — Ambulatory Visit
Admission: RE | Admit: 2016-05-19 | Discharge: 2016-05-19 | Disposition: A | Discharge status: Home / Self Care | Payer: Medicare Other | Source: Ambulatory Visit | Attending: Radiation Oncology | Admitting: Radiation Oncology

## 2016-05-19 DIAGNOSIS — D020 Carcinoma in situ of larynx: Secondary | ICD-10-CM | POA: Diagnosis not present

## 2016-05-20 ENCOUNTER — Ambulatory Visit
Admission: RE | Admit: 2016-05-20 | Discharge: 2016-05-20 | Disposition: A | Discharge status: Home / Self Care | Payer: Medicare Other | Source: Ambulatory Visit | Attending: Radiation Oncology | Admitting: Radiation Oncology

## 2016-05-20 ENCOUNTER — Inpatient Hospital Stay: Payer: Medicare Other | Attending: Radiation Oncology

## 2016-05-20 DIAGNOSIS — D020 Carcinoma in situ of larynx: Secondary | ICD-10-CM | POA: Diagnosis not present

## 2016-05-21 ENCOUNTER — Ambulatory Visit
Admission: RE | Admit: 2016-05-21 | Discharge: 2016-05-21 | Disposition: A | Discharge status: Home / Self Care | Payer: Medicare Other | Source: Ambulatory Visit | Attending: Radiation Oncology | Admitting: Radiation Oncology

## 2016-05-21 DIAGNOSIS — D020 Carcinoma in situ of larynx: Secondary | ICD-10-CM | POA: Diagnosis not present

## 2016-05-22 ENCOUNTER — Ambulatory Visit
Admission: RE | Admit: 2016-05-22 | Discharge: 2016-05-22 | Disposition: A | Discharge status: Home / Self Care | Payer: Medicare Other | Source: Ambulatory Visit | Attending: Radiation Oncology | Admitting: Radiation Oncology

## 2016-05-22 DIAGNOSIS — D020 Carcinoma in situ of larynx: Secondary | ICD-10-CM | POA: Diagnosis not present

## 2016-05-23 ENCOUNTER — Ambulatory Visit
Admission: RE | Admit: 2016-05-23 | Discharge: 2016-05-23 | Disposition: A | Discharge status: Home / Self Care | Payer: Medicare Other | Source: Ambulatory Visit | Attending: Radiation Oncology | Admitting: Radiation Oncology

## 2016-05-23 DIAGNOSIS — D020 Carcinoma in situ of larynx: Secondary | ICD-10-CM | POA: Diagnosis not present

## 2016-05-25 ENCOUNTER — Ambulatory Visit: Payer: Medicare Other

## 2016-05-26 ENCOUNTER — Ambulatory Visit
Admission: RE | Admit: 2016-05-26 | Discharge: 2016-05-26 | Disposition: A | Discharge status: Home / Self Care | Payer: Medicare Other | Source: Ambulatory Visit | Attending: Radiation Oncology | Admitting: Radiation Oncology

## 2016-05-26 ENCOUNTER — Ambulatory Visit: Payer: Medicare Other

## 2016-05-26 DIAGNOSIS — D020 Carcinoma in situ of larynx: Secondary | ICD-10-CM | POA: Diagnosis not present

## 2016-05-27 ENCOUNTER — Other Ambulatory Visit: Payer: Self-pay | Admitting: *Deleted

## 2016-05-27 ENCOUNTER — Ambulatory Visit
Admission: RE | Admit: 2016-05-27 | Discharge: 2016-05-27 | Disposition: A | Discharge status: Home / Self Care | Payer: Medicare Other | Source: Ambulatory Visit | Attending: Radiation Oncology | Admitting: Radiation Oncology

## 2016-05-27 ENCOUNTER — Inpatient Hospital Stay: Payer: Medicare Other

## 2016-05-27 DIAGNOSIS — D020 Carcinoma in situ of larynx: Secondary | ICD-10-CM

## 2016-05-27 LAB — CBC
HEMATOCRIT: 39.3 % — AB (ref 40.0–52.0)
HEMOGLOBIN: 13.5 g/dL (ref 13.0–18.0)
MCH: 30.7 pg (ref 26.0–34.0)
MCHC: 34.4 g/dL (ref 32.0–36.0)
MCV: 89.3 fL (ref 80.0–100.0)
Platelets: 267 10*3/uL (ref 150–440)
RBC: 4.4 MIL/uL (ref 4.40–5.90)
RDW: 14.6 % — ABNORMAL HIGH (ref 11.5–14.5)
WBC: 10.2 10*3/uL (ref 3.8–10.6)

## 2016-05-27 MED ORDER — SUCRALFATE 1 G PO TABS: 1.0000 g | ORAL_TABLET | Freq: Three times a day (TID) | ORAL | 3 refills | Status: DC

## 2016-05-28 ENCOUNTER — Ambulatory Visit
Admission: RE | Admit: 2016-05-28 | Discharge: 2016-05-28 | Disposition: A | Discharge status: Home / Self Care | Payer: Medicare Other | Source: Ambulatory Visit | Attending: Radiation Oncology | Admitting: Radiation Oncology

## 2016-05-28 DIAGNOSIS — D020 Carcinoma in situ of larynx: Secondary | ICD-10-CM | POA: Diagnosis not present

## 2016-05-29 ENCOUNTER — Ambulatory Visit
Admission: RE | Admit: 2016-05-29 | Discharge: 2016-05-29 | Disposition: A | Discharge status: Home / Self Care | Payer: Medicare Other | Source: Ambulatory Visit | Attending: Radiation Oncology | Admitting: Radiation Oncology

## 2016-05-29 DIAGNOSIS — D020 Carcinoma in situ of larynx: Secondary | ICD-10-CM | POA: Diagnosis not present

## 2016-05-30 ENCOUNTER — Ambulatory Visit
Admission: RE | Admit: 2016-05-30 | Discharge: 2016-05-30 | Disposition: A | Discharge status: Home / Self Care | Payer: Medicare Other | Source: Ambulatory Visit | Attending: Radiation Oncology | Admitting: Radiation Oncology

## 2016-05-30 DIAGNOSIS — D020 Carcinoma in situ of larynx: Secondary | ICD-10-CM | POA: Diagnosis not present

## 2016-06-03 ENCOUNTER — Ambulatory Visit
Admission: RE | Admit: 2016-06-03 | Discharge: 2016-06-03 | Disposition: A | Discharge status: Home / Self Care | Payer: Medicare Other | Source: Ambulatory Visit | Attending: Radiation Oncology | Admitting: Radiation Oncology

## 2016-06-03 ENCOUNTER — Inpatient Hospital Stay: Payer: Medicare Other

## 2016-06-03 DIAGNOSIS — D020 Carcinoma in situ of larynx: Secondary | ICD-10-CM | POA: Diagnosis not present

## 2016-06-03 LAB — CBC
HEMATOCRIT: 39.1 % — AB (ref 40.0–52.0)
HEMOGLOBIN: 13.4 g/dL (ref 13.0–18.0)
MCH: 30.9 pg (ref 26.0–34.0)
MCHC: 34.4 g/dL (ref 32.0–36.0)
MCV: 89.8 fL (ref 80.0–100.0)
Platelets: 287 10*3/uL (ref 150–440)
RBC: 4.36 MIL/uL — ABNORMAL LOW (ref 4.40–5.90)
RDW: 14.7 % — AB (ref 11.5–14.5)
WBC: 10 10*3/uL (ref 3.8–10.6)

## 2016-06-04 ENCOUNTER — Ambulatory Visit
Admission: RE | Admit: 2016-06-04 | Discharge: 2016-06-04 | Disposition: A | Discharge status: Home / Self Care | Payer: Medicare Other | Source: Ambulatory Visit | Attending: Radiation Oncology | Admitting: Radiation Oncology

## 2016-06-04 DIAGNOSIS — D020 Carcinoma in situ of larynx: Secondary | ICD-10-CM | POA: Diagnosis not present

## 2016-06-05 ENCOUNTER — Ambulatory Visit
Admission: RE | Admit: 2016-06-05 | Discharge: 2016-06-05 | Disposition: A | Discharge status: Home / Self Care | Payer: Medicare Other | Source: Ambulatory Visit | Attending: Radiation Oncology | Admitting: Radiation Oncology

## 2016-06-05 DIAGNOSIS — D020 Carcinoma in situ of larynx: Secondary | ICD-10-CM | POA: Diagnosis not present

## 2016-06-06 ENCOUNTER — Ambulatory Visit
Admission: RE | Admit: 2016-06-06 | Discharge: 2016-06-06 | Disposition: A | Discharge status: Home / Self Care | Payer: Medicare Other | Source: Ambulatory Visit | Attending: Radiation Oncology | Admitting: Radiation Oncology

## 2016-06-06 DIAGNOSIS — D020 Carcinoma in situ of larynx: Secondary | ICD-10-CM | POA: Diagnosis not present

## 2016-06-09 ENCOUNTER — Ambulatory Visit
Admission: RE | Admit: 2016-06-09 | Discharge: 2016-06-09 | Disposition: A | Discharge status: Home / Self Care | Payer: Medicare Other | Source: Ambulatory Visit | Attending: Radiation Oncology | Admitting: Radiation Oncology

## 2016-06-09 DIAGNOSIS — D020 Carcinoma in situ of larynx: Secondary | ICD-10-CM | POA: Diagnosis not present

## 2016-06-10 ENCOUNTER — Ambulatory Visit
Admission: RE | Admit: 2016-06-10 | Discharge: 2016-06-10 | Disposition: A | Discharge status: Home / Self Care | Payer: Medicare Other | Source: Ambulatory Visit | Attending: Radiation Oncology | Admitting: Radiation Oncology

## 2016-06-10 ENCOUNTER — Inpatient Hospital Stay: Payer: Medicare Other | Attending: Radiation Oncology

## 2016-06-10 DIAGNOSIS — D020 Carcinoma in situ of larynx: Secondary | ICD-10-CM | POA: Insufficient documentation

## 2016-06-10 LAB — CBC
HEMATOCRIT: 38.4 % — AB (ref 40.0–52.0)
HEMOGLOBIN: 13.2 g/dL (ref 13.0–18.0)
MCH: 30.9 pg (ref 26.0–34.0)
MCHC: 34.4 g/dL (ref 32.0–36.0)
MCV: 89.6 fL (ref 80.0–100.0)
Platelets: 278 10*3/uL (ref 150–440)
RBC: 4.29 MIL/uL — ABNORMAL LOW (ref 4.40–5.90)
RDW: 14.3 % (ref 11.5–14.5)
WBC: 9.4 10*3/uL (ref 3.8–10.6)

## 2016-06-11 ENCOUNTER — Ambulatory Visit
Admission: RE | Admit: 2016-06-11 | Discharge: 2016-06-11 | Disposition: A | Discharge status: Home / Self Care | Payer: Medicare Other | Source: Ambulatory Visit | Attending: Radiation Oncology | Admitting: Radiation Oncology

## 2016-06-11 DIAGNOSIS — D020 Carcinoma in situ of larynx: Secondary | ICD-10-CM | POA: Diagnosis not present

## 2016-06-12 ENCOUNTER — Ambulatory Visit
Admission: RE | Admit: 2016-06-12 | Discharge: 2016-06-12 | Disposition: A | Discharge status: Home / Self Care | Payer: Medicare Other | Source: Ambulatory Visit | Attending: Radiation Oncology | Admitting: Radiation Oncology

## 2016-06-12 DIAGNOSIS — D020 Carcinoma in situ of larynx: Secondary | ICD-10-CM | POA: Diagnosis not present

## 2016-06-13 ENCOUNTER — Ambulatory Visit
Admission: RE | Admit: 2016-06-13 | Discharge: 2016-06-13 | Disposition: A | Discharge status: Home / Self Care | Payer: Medicare Other | Source: Ambulatory Visit | Attending: Radiation Oncology | Admitting: Radiation Oncology

## 2016-06-13 DIAGNOSIS — D020 Carcinoma in situ of larynx: Secondary | ICD-10-CM | POA: Diagnosis not present

## 2016-06-16 ENCOUNTER — Ambulatory Visit
Admission: RE | Admit: 2016-06-16 | Discharge: 2016-06-16 | Disposition: A | Discharge status: Home / Self Care | Payer: Medicare Other | Source: Ambulatory Visit | Attending: Radiation Oncology | Admitting: Radiation Oncology

## 2016-06-16 DIAGNOSIS — D020 Carcinoma in situ of larynx: Secondary | ICD-10-CM | POA: Diagnosis not present

## 2016-06-17 ENCOUNTER — Ambulatory Visit
Admission: RE | Admit: 2016-06-17 | Discharge: 2016-06-17 | Disposition: A | Discharge status: Home / Self Care | Payer: Medicare Other | Source: Ambulatory Visit | Attending: Radiation Oncology | Admitting: Radiation Oncology

## 2016-06-17 ENCOUNTER — Inpatient Hospital Stay: Payer: Medicare Other

## 2016-06-17 DIAGNOSIS — D020 Carcinoma in situ of larynx: Secondary | ICD-10-CM | POA: Diagnosis not present

## 2016-06-17 LAB — CBC
HCT: 39.7 % — ABNORMAL LOW (ref 40.0–52.0)
HEMOGLOBIN: 13.7 g/dL (ref 13.0–18.0)
MCH: 31 pg (ref 26.0–34.0)
MCHC: 34.5 g/dL (ref 32.0–36.0)
MCV: 89.8 fL (ref 80.0–100.0)
PLATELETS: 282 10*3/uL (ref 150–440)
RBC: 4.42 MIL/uL (ref 4.40–5.90)
RDW: 14.2 % (ref 11.5–14.5)
WBC: 9.9 10*3/uL (ref 3.8–10.6)

## 2016-06-18 ENCOUNTER — Ambulatory Visit
Admission: RE | Admit: 2016-06-18 | Discharge: 2016-06-18 | Disposition: A | Discharge status: Home / Self Care | Payer: Medicare Other | Source: Ambulatory Visit | Attending: Radiation Oncology | Admitting: Radiation Oncology

## 2016-06-18 DIAGNOSIS — D020 Carcinoma in situ of larynx: Secondary | ICD-10-CM | POA: Diagnosis not present

## 2016-06-19 ENCOUNTER — Ambulatory Visit
Admission: RE | Admit: 2016-06-19 | Discharge: 2016-06-19 | Disposition: A | Discharge status: Home / Self Care | Payer: Medicare Other | Source: Ambulatory Visit | Attending: Radiation Oncology | Admitting: Radiation Oncology

## 2016-06-19 DIAGNOSIS — D020 Carcinoma in situ of larynx: Secondary | ICD-10-CM | POA: Diagnosis not present

## 2016-06-20 ENCOUNTER — Ambulatory Visit
Admission: RE | Admit: 2016-06-20 | Discharge: 2016-06-20 | Disposition: A | Discharge status: Home / Self Care | Payer: Medicare Other | Source: Ambulatory Visit | Attending: Radiation Oncology | Admitting: Radiation Oncology

## 2016-06-20 DIAGNOSIS — D020 Carcinoma in situ of larynx: Secondary | ICD-10-CM | POA: Diagnosis not present

## 2016-06-23 ENCOUNTER — Ambulatory Visit
Admission: RE | Admit: 2016-06-23 | Discharge: 2016-06-23 | Disposition: A | Discharge status: Home / Self Care | Payer: Medicare Other | Source: Ambulatory Visit | Attending: Radiation Oncology | Admitting: Radiation Oncology

## 2016-06-23 DIAGNOSIS — D020 Carcinoma in situ of larynx: Secondary | ICD-10-CM | POA: Diagnosis not present

## 2016-06-24 ENCOUNTER — Ambulatory Visit
Admission: RE | Admit: 2016-06-24 | Discharge: 2016-06-24 | Disposition: A | Discharge status: Home / Self Care | Payer: Medicare Other | Source: Ambulatory Visit | Attending: Radiation Oncology | Admitting: Radiation Oncology

## 2016-06-24 DIAGNOSIS — D020 Carcinoma in situ of larynx: Secondary | ICD-10-CM | POA: Diagnosis not present

## 2016-06-25 ENCOUNTER — Ambulatory Visit
Admission: RE | Admit: 2016-06-25 | Discharge: 2016-06-25 | Disposition: A | Discharge status: Home / Self Care | Payer: Medicare Other | Source: Ambulatory Visit | Attending: Radiation Oncology | Admitting: Radiation Oncology

## 2016-06-25 DIAGNOSIS — D020 Carcinoma in situ of larynx: Secondary | ICD-10-CM | POA: Diagnosis not present

## 2016-06-26 ENCOUNTER — Ambulatory Visit
Admission: RE | Admit: 2016-06-26 | Discharge: 2016-06-26 | Disposition: A | Discharge status: Home / Self Care | Payer: Medicare Other | Source: Ambulatory Visit | Attending: Radiation Oncology | Admitting: Radiation Oncology

## 2016-06-26 DIAGNOSIS — D020 Carcinoma in situ of larynx: Secondary | ICD-10-CM | POA: Diagnosis not present

## 2016-06-27 ENCOUNTER — Ambulatory Visit
Admission: RE | Admit: 2016-06-27 | Discharge: 2016-06-27 | Disposition: A | Discharge status: Home / Self Care | Payer: Medicare Other | Source: Ambulatory Visit | Attending: Radiation Oncology | Admitting: Radiation Oncology

## 2016-06-27 DIAGNOSIS — D020 Carcinoma in situ of larynx: Secondary | ICD-10-CM | POA: Diagnosis not present

## 2016-07-30 ENCOUNTER — Encounter: Payer: Self-pay | Admitting: Radiation Oncology

## 2016-07-30 ENCOUNTER — Ambulatory Visit
Admission: RE | Admit: 2016-07-30 | Discharge: 2016-07-30 | Disposition: A | Discharge status: Home / Self Care | Payer: Medicare Other | Source: Ambulatory Visit | Attending: Radiation Oncology | Admitting: Radiation Oncology

## 2016-07-30 VITALS — BP 129/78 | HR 63 | Temp 96.8°F | Wt 186.9 lb

## 2016-07-30 DIAGNOSIS — Z923 Personal history of irradiation: Secondary | ICD-10-CM | POA: Diagnosis not present

## 2016-07-30 DIAGNOSIS — D020 Carcinoma in situ of larynx: Secondary | ICD-10-CM

## 2016-07-30 DIAGNOSIS — C329 Malignant neoplasm of larynx, unspecified: Secondary | ICD-10-CM | POA: Insufficient documentation

## 2016-07-30 NOTE — Progress Notes (Signed)
Radiation Oncology Follow up Note  Name: Kirk Rodriguez   Date:   07/30/2016 MRN:  [id number] DOB: Jan 09, 1945    This 71 y.o. male presents to the clinic today for one-month follow-up status post radiation therapy for laryngeal carcinoma.  REFERRING PROVIDER: Barbaraann Boys, MD  HPI: Patient is a 71 year old male now out 1 month having completed radiation therapy to his larynx for at least in situ squamous cell carcinoma the larynx. Seen today in routine follow-up he is doing well specifically denies head and neck pain or dysphagia. His voice quality is returning to normal.. Patient did have a one-week episode of acid reflux which has resolved.  COMPLICATIONS OF TREATMENT: none  FOLLOW UP COMPLIANCE: keeps appointments   PHYSICAL EXAM:  BP 129/78   Pulse 63   Temp (!) 96.8 F (36 C)   Wt 186 lb 15.2 oz (84.8 kg)   BMI 25.35 kg/m  Oral cavity is clear no oral mucosal lesions are identified indirect mirror examination shows cord approximately well some slight laryngeal edema noted. Upper airways clear vallecula and base of tongue within normal limits. Neck is clear without evidence of subject gastric cervical or supraclavicular adenopathy. Well-developed well-nourished patient in NAD. HEENT reveals PERLA, EOMI, discs not visualized.  Oral cavity is clear. No oral mucosal lesions are identified. Neck is clear without evidence of cervical or supraclavicular adenopathy. Lungs are clear to A&P. Cardiac examination is essentially unremarkable with regular rate and rhythm without murmur rub or thrill. Abdomen is benign with no organomegaly or masses noted. Motor sensory and DTR levels are equal and symmetric in the upper and lower extremities. Cranial nerves II through XII are grossly intact. Proprioception is intact. No peripheral adenopathy or edema is identified. No motor or sensory levels are noted. Crude visual fields are within normal range.  RADIOLOGY RESULTS: No current films for  review  PLAN: At the present time patient is doing well 1 month out from radiation therapy for early stage laryngeal carcinoma. I've asked him to make a follow-up appointment with ENT. I have asked to see him back in 4-5 months for follow-up. Patient knows to call with any concerns.  I would like to take this opportunity to thank you for allowing me to participate in the care of your patient.Armstead Peaks., MD

## 2016-08-03 ENCOUNTER — Ambulatory Visit
Admission: EM | Admit: 2016-08-03 | Discharge: 2016-08-03 | Disposition: A | Discharge status: Home / Self Care | Payer: Medicare Other | Attending: Registered Nurse | Admitting: Registered Nurse

## 2016-08-03 DIAGNOSIS — T704XXA Effects of high-pressure fluids, initial encounter: Secondary | ICD-10-CM

## 2016-08-03 DIAGNOSIS — S61210A Laceration without foreign body of right index finger without damage to nail, initial encounter: Secondary | ICD-10-CM

## 2016-08-03 MED ORDER — SULFAMETHOXAZOLE-TRIMETHOPRIM 800-160 MG PO TABS: 1.0000 | ORAL_TABLET | Freq: Two times a day (BID) | ORAL | 0 refills | Status: DC

## 2016-08-03 MED ORDER — BACITRACIN-NEOMYCIN-POLYMYXIN 400-5-5000 EX OINT: 1.0000 "application " | TOPICAL_OINTMENT | Freq: Two times a day (BID) | CUTANEOUS | 0 refills | Status: DC

## 2016-08-03 NOTE — ED Provider Notes (Signed)
CSN: [id number]     Arrival date & time 08/03/16  1140 History   First MD Initiated Contact with Patient 08/03/16 1204     Chief Complaint  Patient presents with  . Laceration   (Consider location/radiation/quality/duration/timing/severity/associated sxs/prior Treatment) 71 y/o caucasian male established patient here for right index finger laceration while pressure washing this am.  Last tetanus 2 years ago.  Washed wound with peroxide x 4 and trimmed loose skin with scissors prior to arrival urgent care wondering if he needs stitches.  Denied chemicals in pressure washer water.  Denied foreign body in finger some pain and couldn't get bleeding to stop.  Takes baby aspirin daily.  Hx cancer currently undergoing radiation per patient for larynx cancer.  Slightly tender to touch otherwise feeling well.      Past Medical History:  Diagnosis Date  . Allergy   . Cancer ([place])    Larynx--carcinoma in situ  . Hyperlipidemia   . Wears dentures    full upper, partial lower   Past Surgical History:  Procedure Laterality Date  . DIRECT LARYNGOSCOPY Right 04/03/2016   Procedure: DIRECT MICROLARYNGOSCOPY WITH EXCISON RIGHT VOCAL CORD LESION;  Surgeon: Margaretha Sheffield, MD;  Location: [place];  Service: ENT;  Laterality: Right;  RIGHT   . INGUINAL HERNIA REPAIR Bilateral 12/18/2015   Procedure: LAPAROSCOPIC BILATERAL INGUINAL HERNIA REPAIR;  Surgeon: Hubbard Robinson, MD;  Location: [facility];  Service: General;  Laterality: Bilateral;  . MINOR EXCISION OF ORAL LESION Left 03/06/2016   Procedure: direct microlaryngoscopy with excision left vocal cord lesion;  Surgeon: Margaretha Sheffield, MD;  Location: [place];  Service: ENT;  Laterality: Left;  left vocal cord lesion  . TRIGGER FINGER RELEASE Left 2016  . VASECTOMY Bilateral 1978   Family History  Problem Relation Age of Onset  . Lung cancer Father   . Heart disease Father   . Hypertension Father   . Polycythemia Mother   .  Heart disease Mother    Social History  Substance Use Topics  . Smoking status: Former Smoker    Packs/day: 0.50    Years: 50.00    Types: Cigarettes    Quit date: 04/17/2016  . Smokeless tobacco: Never Used  . Alcohol use Yes     Comment: 6 pack per year    Review of Systems  Constitutional: Negative for activity change, appetite change, chills, diaphoresis, fatigue and fever.  HENT: Negative for trouble swallowing and voice change.   Eyes: Negative for pain, discharge, itching and visual disturbance.  Respiratory: Negative for cough and shortness of breath.   Cardiovascular: Negative for chest pain and palpitations.  Gastrointestinal: Negative for abdominal pain, nausea and vomiting.  Endocrine: Negative for cold intolerance and heat intolerance.  Genitourinary: Negative for dysuria and hematuria.  Musculoskeletal: Positive for myalgias. Negative for arthralgias, back pain, gait problem, joint swelling, neck pain and neck stiffness.  Skin: Positive for wound. Negative for color change, pallor and rash.  Allergic/Immunologic: Positive for immunocompromised state. Negative for environmental allergies and food allergies.  Neurological: Negative for dizziness, tremors, seizures, syncope, facial asymmetry, speech difficulty, weakness, light-headedness, numbness and headaches.  Hematological: Negative for adenopathy. Does not bruise/bleed easily.  Psychiatric/Behavioral: Negative for agitation, behavioral problems, confusion and sleep disturbance.  All other systems reviewed and are negative.   Allergies  Patient has no known allergies.  Home Medications   Prior to Admission medications   Medication Sig Start Date End Date Taking? Authorizing Provider  aspirin EC  81 MG tablet Take 81 mg by mouth daily.    Yes [provider]  Cholecalciferol (VITAMIN D3) 2000 units capsule Take 2,000 Units by mouth daily.    Yes [provider]  Multiple Vitamin (MULTI-VITAMINS)  TABS Take 1 tablet by mouth daily.    Yes [provider]  simvastatin (ZOCOR) 80 MG tablet Take 80 mg by mouth daily at 6 PM.  11/25/15  Yes [provider]  sucralfate (CARAFATE) 1 g tablet Take 1 tablet (1 g total) by mouth 3 (three) times daily. Dissolve in 2-3 tbsp warm water, swish and swallow. 05/27/16  Yes Chrystal, Eulas Post, MD  triamcinolone (NASACORT AQ) 55 MCG/ACT AERO nasal inhaler Place 2 sprays into the nose daily.   Yes [provider]  neomycin-bacitracin-polymyxin (NEOSPORIN) ointment Apply 1 application topically every 12 (twelve) hours. apply to eye 08/03/16   Betancourt, Aura Fey, NP  sulfamethoxazole-trimethoprim (BACTRIM DS,SEPTRA DS) 800-160 MG tablet Take 1 tablet by mouth 2 (two) times daily. 08/03/16   Betancourt, Aura Fey, NP   Meds Ordered and Administered this Visit  Medications - No data to display  BP 117/81 (BP Location: Left Arm)   Pulse 64   Temp 98.3 F (36.8 C) (Oral)   Resp 16   Ht 6\' 1"  (1.854 m)   Wt 180 lb (81.6 kg)   SpO2 98%   BMI 23.75 kg/m  No data found.   Physical Exam  Constitutional: Vital signs are normal. He appears well-developed and well-nourished. He is active and cooperative.  Non-toxic appearance. He does not have a sickly appearance. He does not appear ill. No distress.  HENT:  Head: Normocephalic and atraumatic.  Right Ear: Hearing and external ear normal.  Left Ear: Hearing and external ear normal.  Nose: Nose normal.  Mouth/Throat: Uvula is midline, oropharynx is clear and moist and mucous membranes are normal. No oropharyngeal exudate.  Eyes: Pupils are equal, round, and reactive to light. Conjunctivae, EOM and lids are normal. Right eye exhibits no discharge. Left eye exhibits no discharge. No scleral icterus.  Neck: Trachea normal, normal range of motion and phonation normal. Neck supple. No tracheal deviation present. No thyromegaly present.  Cardiovascular: Normal rate, regular rhythm and intact distal  pulses.   No murmur heard. Pulses:      Radial pulses are 2+ on the right side, and 2+ on the left side.  Pulmonary/Chest: Effort normal and breath sounds normal. No stridor. No respiratory distress. He has no wheezes.  Abdominal: Soft. He exhibits no distension. There is no guarding. No hernia.  Musculoskeletal: He exhibits edema, tenderness and deformity.       Right shoulder: Normal.       Left shoulder: Normal.       Right elbow: Normal.      Left elbow: Normal.       Right wrist: Normal.       Left wrist: Normal.       Right hip: Normal.       Left hip: Normal.       Right knee: Normal.       Left knee: Normal.       Cervical back: Normal.       Thoracic back: Normal.       Lumbar back: Normal.       Left forearm: Normal.       Right hand: He exhibits tenderness, laceration and swelling. He exhibits normal range of motion, no bony tenderness, normal two-point discrimination, normal  capillary refill and no deformity. Normal sensation noted. Normal strength noted.       Left hand: Normal.       Hands: 1 inch laceration palmar surface right 2nd digit distal to DIP joint full arom with slight pain cap refill less than 2 seconds serosanguinous discharge on admit patient has applied gauze and pressure to area; wound edges ragged and wound bed beefy red  Neurological: He is alert. He has normal strength. He is not disoriented. He displays no atrophy and no tremor. No cranial nerve deficit or sensory deficit. He exhibits normal muscle tone. He displays no seizure activity. Coordination and gait normal. GCS eye subscore is 4. GCS verbal subscore is 5. GCS motor subscore is 6.  On off exam table; in/out of chair without difficulty gait sure and steady in hall  Skin: Skin is warm and dry. Capillary refill takes less than 2 seconds. Abrasion and laceration noted. No bruising, no burn, no ecchymosis, no lesion, no petechiae and no rash noted. He is not diaphoretic. There is erythema. No cyanosis.  No pallor. Nails show no clubbing.     Psychiatric: He has a normal mood and affect. His speech is normal and behavior is normal. Judgment and thought content normal. He is not actively hallucinating. Cognition and memory are normal. He is attentive.  Nursing note and vitals reviewed.   Urgent Care Course     Procedures (including critical care time)  Labs Review Labs Reviewed - No data to display  Imaging Review No results found.  Right hand soaked in wound cleanser/saline.  Applied 3 steristrips to approximate wound edges. Bleeding ceased/controlled after pressure applied and wound edges approximated. Unable to suture due to patient clipping skin prior to arrival and not dermabond type wound due to depth.  Applied triple antibiotic, 2x2 gauze and secured with cobain.  Patient to elevate, rest, start bactrim ds po BID x 7 days. Dressing clean dry and intact on discharge ambulatory in NAD. Follow up if erythema, purulent discharge, red streaks, worsening pain/swelling.  Patient verbalized understanding information/instructions, agreed with plan of care and had no further questions at this time.    MDM   1. Laceration of right index finger without foreign body without damage to nail, initial encounter    Patient was instructed to rest, ice and elevate right hand.  Do not soak hand until laceration healed avoid pool, lake, hot tub, dirty sink water.  Exitcare handout on cellulitis and laceration  given to patient.   Rx bactrim ds po bid x 7 days #14 RF0, tylenol 1000mg  po QID prn pain and topical triple antibiotic daily and prn.  Leave steristrips in place until they fall off in 7 to 10 days may trim edges.  Medications as directed.  Call or return to clinic as needed if these symptoms worsen or fail to improve as anticipated Patient verbalized agreement and understanding of treatment plan.  P2:  ROM, injury prevention    Olen Cordial, NP 08/03/16 1243

## 2016-08-03 NOTE — ED Triage Notes (Signed)
Pt reports he was pressure washing this morning when he the water cut his right first finger. He states he last had his tetanus vax 2 years ago at [facility]. He used scissors to "trim up the edges" of the laceration. He is concerned because the laceration will not stop bleeding. He is on ASA 81 mg.

## 2016-08-09 ENCOUNTER — Encounter: Payer: Self-pay | Admitting: Gynecology

## 2016-08-09 ENCOUNTER — Ambulatory Visit
Admission: EM | Admit: 2016-08-09 | Discharge: 2016-08-09 | Disposition: A | Discharge status: Home / Self Care | Payer: Medicare Other | Attending: Family Medicine | Admitting: Family Medicine

## 2016-08-09 DIAGNOSIS — S61210D Laceration without foreign body of right index finger without damage to nail, subsequent encounter: Secondary | ICD-10-CM

## 2016-08-09 DIAGNOSIS — Z5189 Encounter for other specified aftercare: Secondary | ICD-10-CM | POA: Diagnosis not present

## 2016-08-09 NOTE — ED Triage Notes (Signed)
Patient c/o recheck right index finger x 10 days.

## 2016-08-09 NOTE — ED Provider Notes (Signed)
[facility] ____________________________________________  Time seen: Approximately 9:05 AM  I have reviewed the triage vital signs and the nursing notes.   HISTORY  Chief Complaint Wound Check   HPI Kirk Rodriguez is a 71 y.o. male present for reevaluation of right index finger wound. Patient reports one week ago he was working outside with a pressure washer, and accidentally hit his finger with the water causing a laceration wound. States that he was seen in urgent care at that time due to continued bleeding. Patient reports that he had trimmed the skin off prior to going to the urgent care so the wound was unable to be sewn. Reports he had Steri-Strips applied and has been taking antibiotics. Patient states that he is here today to make sure wound is healing well.  Denies other injury or traumas. Reports does have some tenderness to direct palpation at the wound edge, but states overall is not painful. Has been taking antibiotics as prescribed in keeping clean. Denies any paresthesias, decreased range of motion, pain radiation, fevers, drainage. Patient declines current concern of foreign body. Patient denies concern of bony injury. Reports otherwise feels well and denies other complaints. Reports tetanus immunization is up to date.   Past Medical History:  Diagnosis Date  . Allergy   . Cancer ([place])    Larynx--carcinoma in situ  . Hyperlipidemia   . Wears dentures    full upper, partial lower    Patient Active Problem List   Diagnosis Date Noted  . COPD (chronic obstructive pulmonary disease) ([place]) 12/04/2015  . History of chickenpox 12/04/2015  . Hyperlipidemia, unspecified 12/04/2015  . Hypertension 12/04/2015  . Non-recurrent bilateral inguinal hernia without obstruction or gangrene 12/04/2015  . Current smoker 09/30/2013  . Trigger finger 06/09/2013    Past Surgical History:  Procedure Laterality Date  . DIRECT LARYNGOSCOPY Right 04/03/2016   Procedure:  DIRECT MICROLARYNGOSCOPY WITH EXCISON RIGHT VOCAL CORD LESION;  Surgeon: Margaretha Sheffield, MD;  Location: [place];  Service: ENT;  Laterality: Right;  RIGHT   . INGUINAL HERNIA REPAIR Bilateral 12/18/2015   Procedure: LAPAROSCOPIC BILATERAL INGUINAL HERNIA REPAIR;  Surgeon: Hubbard Robinson, MD;  Location: [facility];  Service: General;  Laterality: Bilateral;  . MINOR EXCISION OF ORAL LESION Left 03/06/2016   Procedure: direct microlaryngoscopy with excision left vocal cord lesion;  Surgeon: Margaretha Sheffield, MD;  Location: [place];  Service: ENT;  Laterality: Left;  left vocal cord lesion  . TRIGGER FINGER RELEASE Left 2016  . VASECTOMY Bilateral 1978     No current facility-administered medications for this encounter.   Current Outpatient Prescriptions:  .  aspirin EC 81 MG tablet, Take 81 mg by mouth daily. , Disp: , Rfl:  .  Cholecalciferol (VITAMIN D3) 2000 units capsule, Take 2,000 Units by mouth daily. , Disp: , Rfl:  .  Multiple Vitamin (MULTI-VITAMINS) TABS, Take 1 tablet by mouth daily. , Disp: , Rfl:  .  neomycin-bacitracin-polymyxin (NEOSPORIN) ointment, Apply 1 application topically every 12 (twelve) hours. apply to eye, Disp: 15 g, Rfl: 0 .  simvastatin (ZOCOR) 80 MG tablet, Take 80 mg by mouth daily at 6 PM. , Disp: , Rfl:  .  sucralfate (CARAFATE) 1 g tablet, Take 1 tablet (1 g total) by mouth 3 (three) times daily. Dissolve in 2-3 tbsp warm water, swish and swallow., Disp: 90 tablet, Rfl: 3 .  sulfamethoxazole-trimethoprim (BACTRIM DS,SEPTRA DS) 800-160 MG tablet, Take 1 tablet by mouth 2 (two) times daily., Disp: 14  tablet, Rfl: 0 .  triamcinolone (NASACORT AQ) 55 MCG/ACT AERO nasal inhaler, Place 2 sprays into the nose daily., Disp: , Rfl:   Allergies Patient has no known allergies.  Family History  Problem Relation Age of Onset  . Lung cancer Father   . Heart disease Father   . Hypertension Father   . Polycythemia Mother   . Heart disease Mother      Social History Social History  Substance Use Topics  . Smoking status: Former Smoker    Packs/day: 0.50    Years: 50.00    Types: Cigarettes    Quit date: 04/17/2016  . Smokeless tobacco: Never Used  . Alcohol use Yes     Comment: 6 pack per year    Review of Systems Constitutional: No fever/chills Cardiovascular: Denies chest pain. Respiratory: Denies shortness of breath. Musculoskeletal: Negative for back pain. Skin: Negative for rash.As above.   ____________________________________________   PHYSICAL EXAM:  VITAL SIGNS: ED Triage Vitals [08/09/16 0849]  Enc Vitals Group     BP 111/80     Pulse Rate 68     Resp 16     Temp 98.1 F (36.7 C)     Temp Source Oral     SpO2 99 %     Weight 180 lb (81.6 kg)     Height      Head Circumference      Peak Flow      Pain Score 2     Pain Loc      Pain Edu?      Excl. in [place]?     Constitutional: Alert and oriented. Well appearing and in no acute distress. Cardiovascular:  Good peripheral circulation. Respiratory: Normal respiratory effort without tachypnea nor retractions.  Musculoskeletal:  Steady gait.  Neurologic:  Normal speech and language. Speech is normal. No gait instability.  Skin:  Skin is warm, dry and intact. No rash noted. Except: right distal fat pad of index finger approximately 2 cm healing wound with scabbing, minimal swelling, no erythema, minimal tenderness along wound margin, no palpable foreign bodies, no bony tenderness, no drainage, no fluctuance or induration, full range of motion, no motor or tendon deficit to right index finger with normal distal capillary refill.  Psychiatric: Mood and affect are normal. Speech and behavior are normal. Patient exhibits appropriate insight and judgment   ___________________________________________   LABS (all labs ordered are listed, but only abnormal results are displayed)  Labs Reviewed - No data to display   PROCEDURES Procedures    INITIAL  IMPRESSION / ASSESSMENT AND PLAN / ED COURSE  Pertinent labs & imaging results that were available during my care of the patient were reviewed by me and considered in my medical decision making (see chart for details).  Very well-appearing patient. No acute distress. Presents for reevaluation of a wound that was sustained 1 week ago. Wound appears to be healing well, no erythema or drainage. Patient declines concern of foreign body at this time and declines evaluation by x-ray. Encouraged patient to continue keeping clean and continue home medications including oral and topical antibiotic. Discussed strict follow-up and return parameters.  Discussed follow up with Primary care physician this week as needed. Discussed follow up and return parameters including no resolution or any worsening concerns. Patient verbalized understanding and agreed to plan.   ____________________________________________   FINAL CLINICAL IMPRESSION(S) / ED DIAGNOSES  Final diagnoses:  Visit for wound check     Discharge Medication List as of  08/09/2016  9:05 AM      Note: This dictation was prepared with Dragon dictation along with smaller phrase technology. Any transcriptional errors that result from this process are unintentional.         Marylene Land, NP 08/09/16 0945

## 2016-08-09 NOTE — Discharge Instructions (Signed)
Continue medication as prescribed. Rest. Drink plenty of fluids.   ° °Follow up with your primary care physician this week as needed. Return to Urgent care for new or worsening concerns.  ° °

## 2016-09-19 ENCOUNTER — Other Ambulatory Visit: Payer: Self-pay | Admitting: Radiation Oncology

## 2016-12-24 ENCOUNTER — Encounter: Payer: Self-pay | Admitting: *Deleted

## 2017-01-15 ENCOUNTER — Ambulatory Visit: Payer: Medicare Other | Admitting: Radiation Oncology

## 2017-02-12 ENCOUNTER — Ambulatory Visit
Admission: RE | Admit: 2017-02-12 | Discharge: 2017-02-12 | Disposition: A | Discharge status: Home / Self Care | Payer: Medicare Other | Source: Ambulatory Visit | Attending: Radiation Oncology | Admitting: Radiation Oncology

## 2017-02-12 ENCOUNTER — Other Ambulatory Visit: Payer: Self-pay

## 2017-02-12 ENCOUNTER — Encounter: Payer: Self-pay | Admitting: Radiation Oncology

## 2017-02-12 VITALS — BP 119/74 | HR 83 | Temp 97.6°F | Resp 20 | Wt 193.0 lb

## 2017-02-12 DIAGNOSIS — D020 Carcinoma in situ of larynx: Secondary | ICD-10-CM

## 2017-02-12 DIAGNOSIS — Z923 Personal history of irradiation: Secondary | ICD-10-CM | POA: Insufficient documentation

## 2017-02-12 DIAGNOSIS — F17210 Nicotine dependence, cigarettes, uncomplicated: Secondary | ICD-10-CM | POA: Diagnosis not present

## 2017-02-12 DIAGNOSIS — Z8521 Personal history of malignant neoplasm of larynx: Secondary | ICD-10-CM | POA: Insufficient documentation

## 2017-02-12 NOTE — Progress Notes (Signed)
Radiation Oncology Follow up Note  Name: Kirk Rodriguez   Date:   02/12/2017 MRN:  [id number] DOB: May 02, 1945    This 72 y.o. male presents to the clinic today for 6 month follow-up status post radiation therapy for squamous cell carcinoma of the larynx.  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 Seen today in routine follow-up he is doing well. Specifically denies head and neck pain or dysphagia. Has some sensation in his in her cheeks most likely related to his dentures. His was quality is good..  COMPLICATIONS OF TREATMENT: none  FOLLOW UP COMPLIANCE: keeps appointments   PHYSICAL EXAM:  BP 119/74   Pulse 83   Temp 97.6 F (36.4 C)   Resp 20   Wt 193 lb 0.2 oz (87.5 kg)   BMI 25.46 kg/m  Oral cavity is clear no oral mucosal lesions are identified. Indirect mirror examination is upper were a clear slight edema of the larynx no visible lesion is noted vallecula and base of tongue within normal limits. Cords approximate well. Neck is clear without evidence of subject gastric cervical or supraclavicular adenopathy. Well-developed well-nourished patient in NAD. HEENT reveals PERLA, EOMI, discs not visualized.  Oral cavity is clear. No oral mucosal lesions are identified. Neck is clear without evidence of cervical or supraclavicular adenopathy. Lungs are clear to A&P. Cardiac examination is essentially unremarkable with regular rate and rhythm without murmur rub or thrill. Abdomen is benign with no organomegaly or masses noted. Motor sensory and DTR levels are equal and symmetric in the upper and lower extremities. Cranial nerves II through XII are grossly intact. Proprioception is intact. No peripheral adenopathy or edema is identified. No motor or sensory levels are noted. Crude visual fields are within normal range.  RADIOLOGY RESULTS: No  current films for review  PLAN: Present time patient is doing well with no evidence of disease. He is scheduled for follow-up with ENT in the next several weeks. I've asked to see him back in 6 months for follow-up and then will start once a month follow-up visits. Patient is to call with any concerns.  I would like to take this opportunity to thank you for allowing me to participate in the care of your patient.Noreene Filbert, MD

## 2017-04-18 ENCOUNTER — Ambulatory Visit
Admission: EM | Admit: 2017-04-18 | Discharge: 2017-04-18 | Disposition: A | Discharge status: Home / Self Care | Payer: Medicare Other | Attending: Family Medicine | Admitting: Family Medicine

## 2017-04-18 ENCOUNTER — Other Ambulatory Visit: Payer: Self-pay

## 2017-04-18 DIAGNOSIS — L03818 Cellulitis of other sites: Secondary | ICD-10-CM | POA: Diagnosis not present

## 2017-04-18 DIAGNOSIS — W57XXXA Bitten or stung by nonvenomous insect and other nonvenomous arthropods, initial encounter: Secondary | ICD-10-CM

## 2017-04-18 MED ORDER — DOXYCYCLINE HYCLATE 100 MG PO TABS: 100.0000 mg | ORAL_TABLET | Freq: Two times a day (BID) | ORAL | 0 refills | Status: DC

## 2017-04-18 NOTE — ED Provider Notes (Signed)
[facility]    CSN: [id number] Arrival date & time: 04/18/17  0804     History   Chief Complaint Chief Complaint  Patient presents with  . Tick Removal    HPI Kirk Rodriguez is a 72 y.o. male.   72 yo male with a c/o redness to abdominal skin after tick bite. States he pulled off tick yesterday. Tick was embedded but not engorged. Unsure of time embedded. Patient denies any fevers, chills, or drainage.   The history is provided by the patient.    Past Medical History:  Diagnosis Date  . Allergy   . Cancer ([place])    Larynx--carcinoma in situ  . Hyperlipidemia   . Wears dentures    full upper, partial lower    Patient Active Problem List   Diagnosis Date Noted  . COPD (chronic obstructive pulmonary disease) ([place]) 12/04/2015  . History of chickenpox 12/04/2015  . Hyperlipidemia, unspecified 12/04/2015  . Hypertension 12/04/2015  . Non-recurrent bilateral inguinal hernia without obstruction or gangrene 12/04/2015  . Current smoker 09/30/2013  . Trigger finger 06/09/2013    Past Surgical History:  Procedure Laterality Date  . DIRECT LARYNGOSCOPY Right 04/03/2016   Procedure: DIRECT MICROLARYNGOSCOPY WITH EXCISON RIGHT VOCAL CORD LESION;  Surgeon: Margaretha Sheffield, MD;  Location: [place];  Service: ENT;  Laterality: Right;  RIGHT   . INGUINAL HERNIA REPAIR Bilateral 12/18/2015   Procedure: LAPAROSCOPIC BILATERAL INGUINAL HERNIA REPAIR;  Surgeon: Hubbard Robinson, MD;  Location: [facility];  Service: General;  Laterality: Bilateral;  . MINOR EXCISION OF ORAL LESION Left 03/06/2016   Procedure: direct microlaryngoscopy with excision left vocal cord lesion;  Surgeon: Margaretha Sheffield, MD;  Location: [place];  Service: ENT;  Laterality: Left;  left vocal cord lesion  . TRIGGER FINGER RELEASE Left 2016  . VASECTOMY Bilateral 1978       Home Medications    Prior to Admission medications   Medication Sig Start Date End Date Taking?  Authorizing Provider  aspirin EC 81 MG tablet Take 81 mg by mouth daily.     [provider]  Cholecalciferol (VITAMIN D3) 2000 units capsule Take 2,000 Units by mouth daily.     [provider]  doxycycline (VIBRA-TABS) 100 MG tablet Take 1 tablet (100 mg total) by mouth 2 (two) times daily. 04/18/17   Norval Gable, MD  Multiple Vitamin (MULTI-VITAMINS) TABS Take 1 tablet by mouth daily.     [provider]  neomycin-bacitracin-polymyxin (NEOSPORIN) ointment Apply 1 application topically every 12 (twelve) hours. apply to eye 08/03/16   Betancourt, Aura Fey, NP  simvastatin (ZOCOR) 80 MG tablet Take 80 mg by mouth daily at 6 PM.  11/25/15   [provider]  triamcinolone (NASACORT AQ) 55 MCG/ACT AERO nasal inhaler Place 2 sprays into the nose daily.    [provider]    Family History Family History  Problem Relation Age of Onset  . Lung cancer Father   . Heart disease Father   . Hypertension Father   . Polycythemia Mother   . Heart disease Mother     Social History Social History   Tobacco Use  . Smoking status: Former Smoker    Packs/day: 0.50    Years: 50.00    Pack years: 25.00    Types: Cigarettes    Last attempt to quit: 04/17/2016    Years since quitting: 1.0  . Smokeless tobacco: Never Used  Substance Use Topics  . Alcohol use:  Not Currently  . Drug use: No     Allergies   Patient has no known allergies.   Review of Systems Review of Systems   Physical Exam Triage Vital Signs ED Triage Vitals  Enc Vitals Group     BP 04/18/17 0815 120/90     Pulse Rate 04/18/17 0815 75     Resp 04/18/17 0815 18     Temp 04/18/17 0815 98.3 F (36.8 C)     Temp Source 04/18/17 0815 Oral     SpO2 04/18/17 0815 99 %     Weight 04/18/17 0813 185 lb (83.9 kg)     Height 04/18/17 0813 6\' 1"  (1.854 m)     Head Circumference --      Peak Flow --      Pain Score 04/18/17 0812 0     Pain Loc --      Pain Edu? --      Excl. in [place]?  --    No data found.  Updated Vital Signs BP 120/90 (BP Location: Left Arm)   Pulse 75   Temp 98.3 F (36.8 C) (Oral)   Resp 18   Ht 6\' 1"  (1.854 m)   Wt 185 lb (83.9 kg)   SpO2 99%   BMI 24.41 kg/m   Visual Acuity Right Eye Distance:   Left Eye Distance:   Bilateral Distance:    Right Eye Near:   Left Eye Near:    Bilateral Near:     Physical Exam  Constitutional: He appears well-developed and well-nourished. No distress.  Skin: He is not diaphoretic.  Puncture wound on abdominal skin with 3x3cm area of blanchable erythema, warmth, and skin tenderness to palpation; no drainage  Nursing note and vitals reviewed.    UC Treatments / Results  Labs (all labs ordered are listed, but only abnormal results are displayed) Labs Reviewed - No data to display  EKG None Radiology No results found.  Procedures Procedures (including critical care time)  Medications Ordered in UC Medications - No data to display   Initial Impression / Assessment and Plan / UC Course  I have reviewed the triage vital signs and the nursing notes.  Pertinent labs & imaging results that were available during my care of the patient were reviewed by me and considered in my medical decision making (see chart for details).       Final Clinical Impressions(s) / UC Diagnoses   Final diagnoses:  Tick bite, initial encounter  Cellulitis of other specified site    ED Discharge Orders        Ordered    doxycycline (VIBRA-TABS) 100 MG tablet  2 times daily     04/18/17 0836     1. diagnosis reviewed with patient 2. rx as per orders above; reviewed possible side effects, interactions, risks and benefits  3. Recommend supportive treatment with warm compresses to area 4. Follow-up prn if symptoms worsen or don't improve  Controlled Substance Prescriptions [place] Controlled Substance Registry consulted? Not Applicable   Norval Gable, MD 04/18/17 [phone]

## 2017-04-18 NOTE — ED Triage Notes (Signed)
Pt reports he pulled a tick off right lower abdomen yesterday. He then dug the head out. Area reddened

## 2017-08-19 ENCOUNTER — Other Ambulatory Visit: Payer: Self-pay

## 2017-08-19 ENCOUNTER — Ambulatory Visit
Admission: RE | Admit: 2017-08-19 | Discharge: 2017-08-19 | Disposition: A | Discharge status: Home / Self Care | Payer: Medicare Other | Source: Ambulatory Visit | Attending: Radiation Oncology | Admitting: Radiation Oncology

## 2017-08-19 DIAGNOSIS — Z8521 Personal history of malignant neoplasm of larynx: Secondary | ICD-10-CM | POA: Insufficient documentation

## 2017-08-19 DIAGNOSIS — Z923 Personal history of irradiation: Secondary | ICD-10-CM | POA: Diagnosis not present

## 2017-08-19 DIAGNOSIS — D020 Carcinoma in situ of larynx: Secondary | ICD-10-CM

## 2017-08-19 NOTE — Progress Notes (Signed)
Radiation Oncology Follow up Note  Name: Kirk Rodriguez   Date:   08/19/2017 MRN:  [id number] DOB: 28-Nov-1945    This 72 y.o. male presents to the clinic today for 1 year follow-up for squamous cell carcinoma of the larynx stage Istatus post external beam radiation therapy.  REFERRING PROVIDER: Barbaraann Boys, MD  HPI: patient is a 72 year old male now one year out having completedradiation therapy to his larynx for stage I squamous cell carcinoma. He is doing well and is without complaint. He specifically denies dysphagia or head and neck pain. Hisonal quality is excellent..  COMPLICATIONS OF TREATMENT: none  FOLLOW UP COMPLIANCE: keeps appointments   PHYSICAL EXAM:  BP (P) 114/73 (BP Location: Left Arm)   Pulse (P) 68   Temp (P) 98 F (36.7 C)   Wt (P) 187 lb 15.1 oz (85.2 kg)   BMI (P) 24.80 kg/m  Oral cavity is clear no oral mucosal lesions are identified indirect mirror examination shows cord approximately well no evidence of mass or nodularity in the larynx supraglottic larynx base of tongue.Well-developed well-nourished patient in NAD. HEENT reveals PERLA, EOMI, discs not visualized.  Oral cavity is clear. No oral mucosal lesions are identified. Neck is clear without evidence of cervical or supraclavicular adenopathy. Lungs are clear to A&P. Cardiac examination is essentially unremarkable with regular rate and rhythm without murmur rub or thrill. Abdomen is benign with no organomegaly or masses noted. Motor sensory and DTR levels are equal and symmetric in the upper and lower extremities. Cranial nerves II through XII are grossly intact. Proprioception is intact. No peripheral adenopathy or edema is identified. No motor or sensory levels are noted. Crude visual fields are within normal range.  RADIOLOGY RESULTS: no current films for review  PLAN: resent time he continues to do well with no evidence of disease. I'm please was overall progress. I have asked to see him back in 1  year for follow-up. Patient is to call with any concerns. He continues close follow-up care with ENT. I am recommending him for the smokers CT screening program. Patient is to call with any concerns.  I would like to take this opportunity to thank you for allowing me to participate in the care of your patient.Noreene Filbert, MD

## 2017-08-25 ENCOUNTER — Telehealth: Payer: Self-pay | Admitting: *Deleted

## 2017-08-25 NOTE — Telephone Encounter (Signed)
Received referral for low dose lung cancer screening CT scan.  Message left at phone number listed in EMR for patient to call either myself or Shawn Perkins back at [phone] to facilitate scheduling the scan.    

## 2017-08-26 ENCOUNTER — Telehealth: Payer: Self-pay | Admitting: *Deleted

## 2017-08-26 DIAGNOSIS — Z122 Encounter for screening for malignant neoplasm of respiratory organs: Secondary | ICD-10-CM

## 2017-08-26 NOTE — Telephone Encounter (Signed)
Received a referral for initial lung cancer screening scan.  Contacted the patient and obtained their smoking history, former smoker quit April 2017 79.5 pkyr history, as well as answering questions related to screening process.  Patient denies signs of lung cancer such as weight loss or hemoptysis at this time.  Patient denies comorbidity that would prevent curative treatment if lung cancer were found.  Patient is scheduled for the Shared Decision Making Visit and CT scan on 09-08-17 at 1315.

## 2017-09-03 IMAGING — CR DG CHEST 2V
1 series · 2 of 2 positions shown · non-contrast
Comparison: None.

CLINICAL DATA: Preop for hernia surgery.

EXAM:
CHEST  2 VIEW

[Series 1: dg chest 2 view · 0.14mm/px · 2 of 2 slices shown]
[im 1/2]
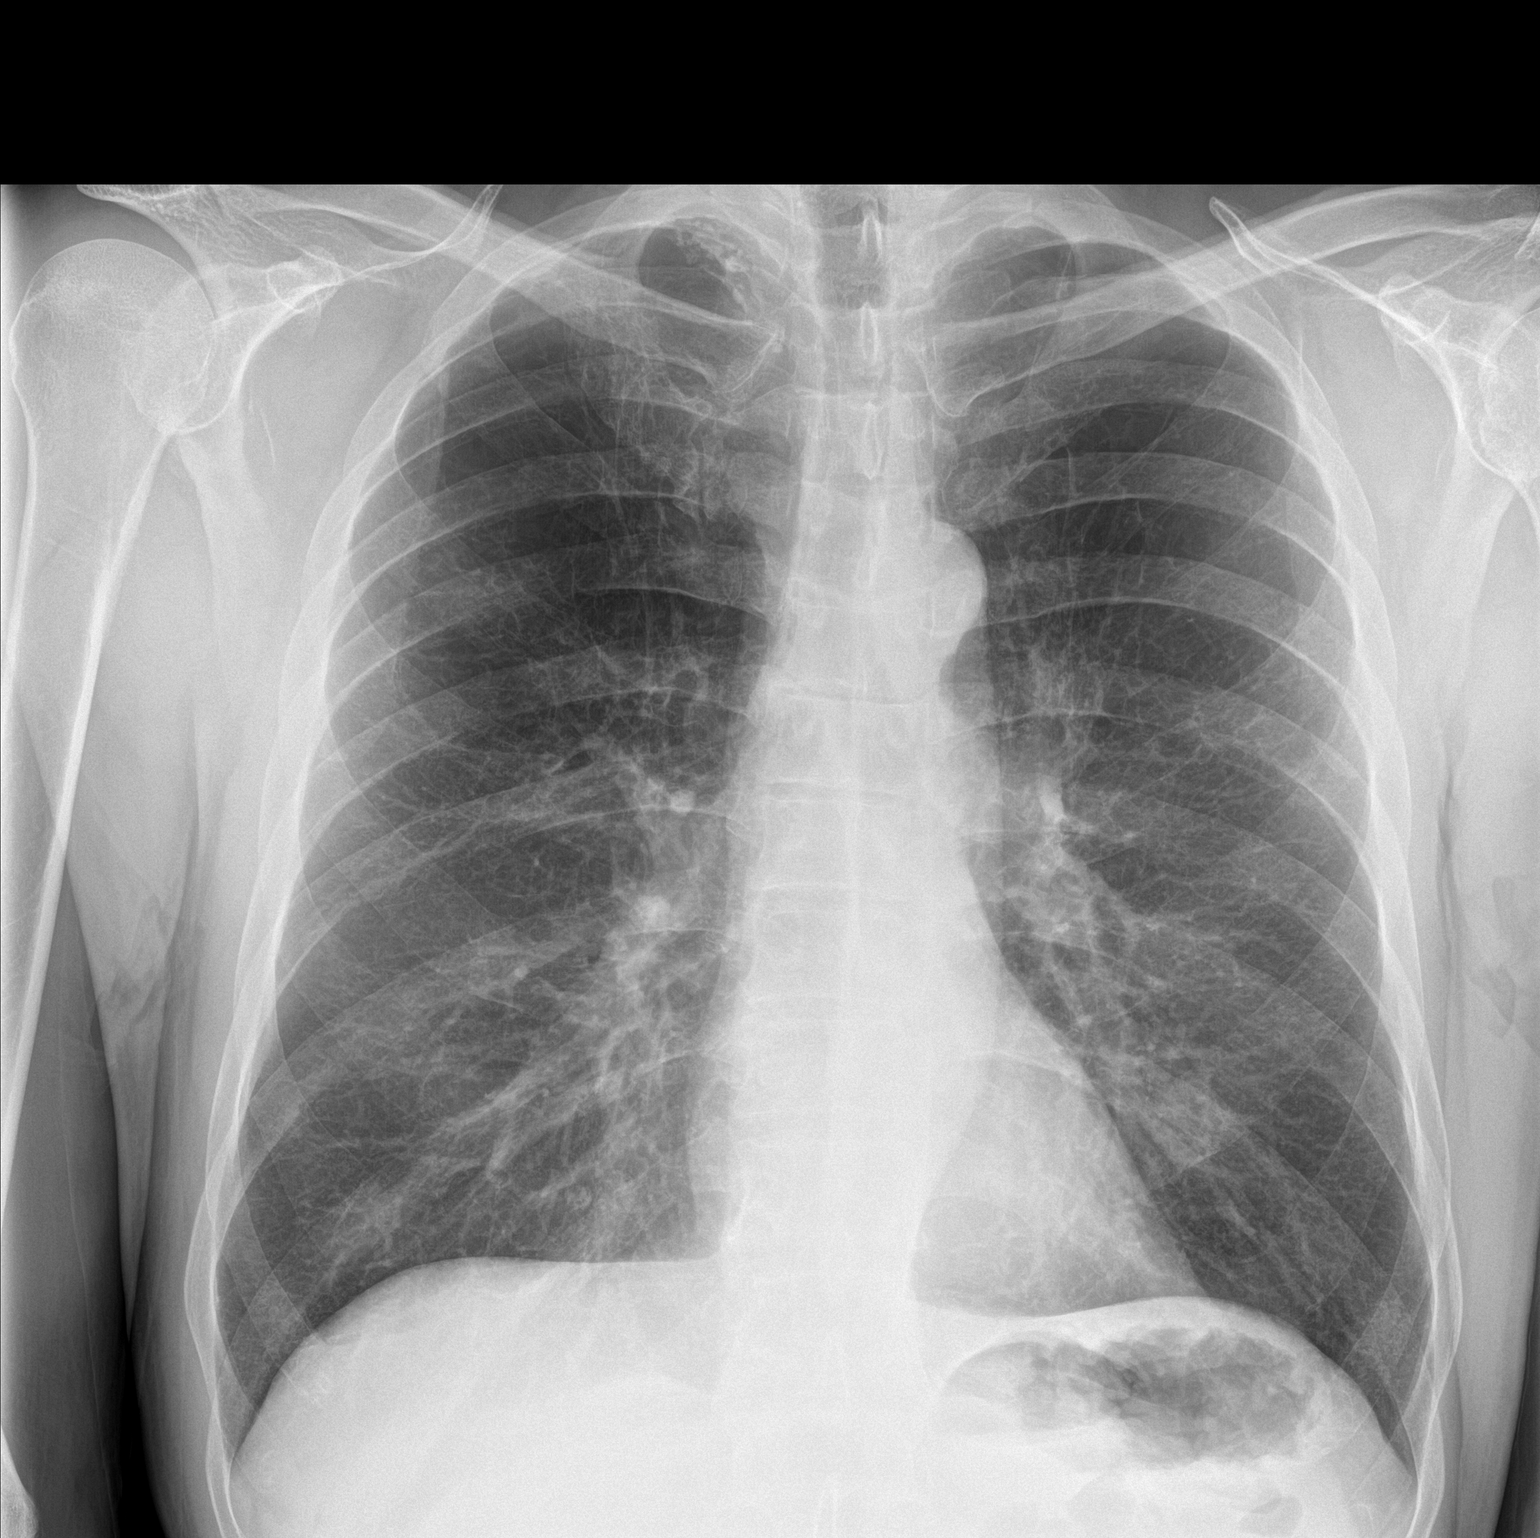
[im 2/2]
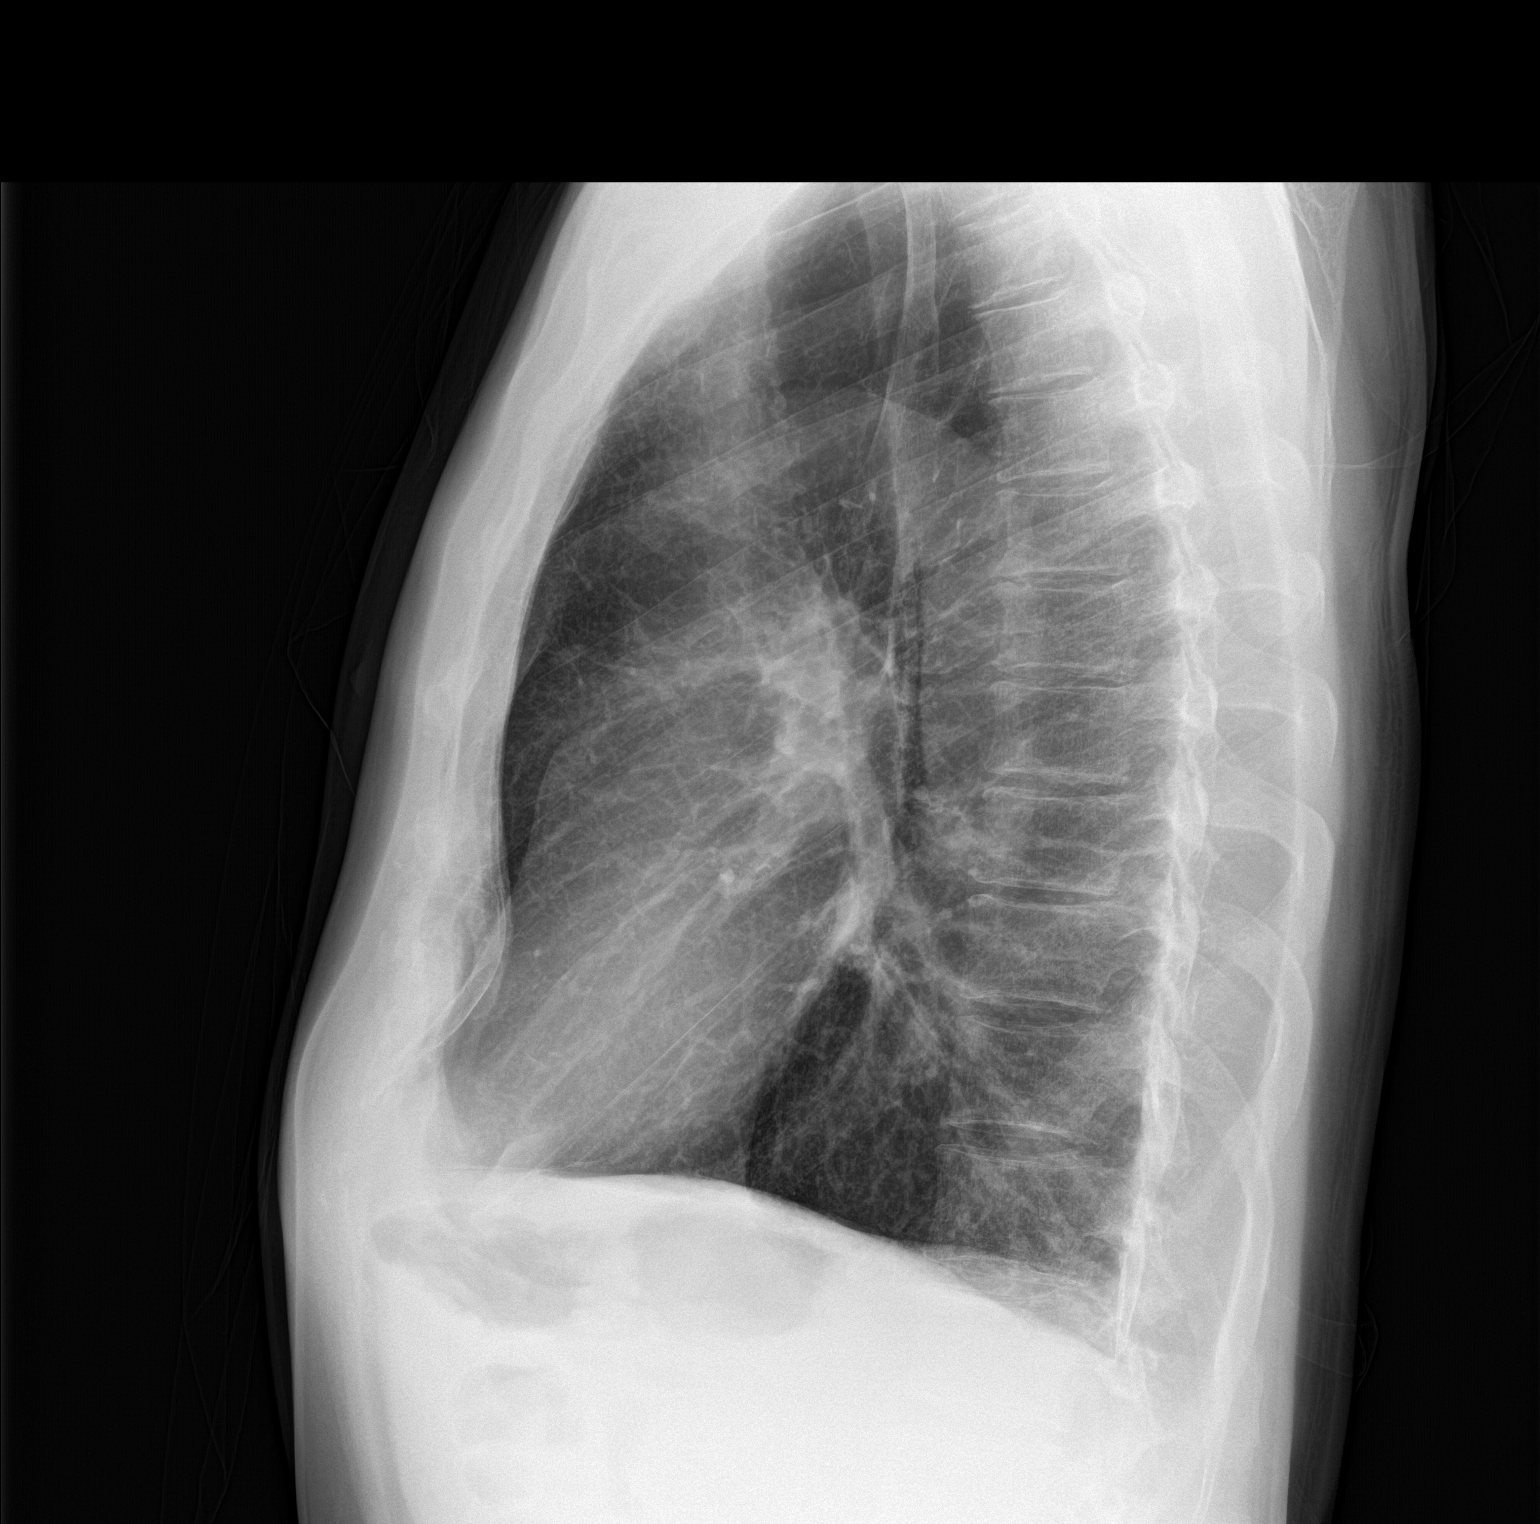

[2 of 2 positions shown; findings below may reference images not displayed]

FINDINGS: The heart size and mediastinal contours are within normal limits. No
pneumothorax or pleural effusion is noted. Stable emphysematous
changes noted in the upper lobes bilaterally. No acute pulmonary
disease is noted. The visualized skeletal structures are
unremarkable.
IMPRESSION: Emphysematous disease is noted bilaterally. No acute abnormality
seen.

## 2017-09-08 ENCOUNTER — Ambulatory Visit: Payer: Medicare Other | Admitting: Oncology

## 2017-09-08 ENCOUNTER — Ambulatory Visit: Payer: Medicare Other

## 2017-09-10 ENCOUNTER — Encounter: Payer: Self-pay | Admitting: Oncology

## 2017-09-10 ENCOUNTER — Ambulatory Visit
Admission: RE | Admit: 2017-09-10 | Discharge: 2017-09-10 | Disposition: A | Discharge status: Home / Self Care | Payer: Medicare Other | Source: Ambulatory Visit | Attending: Oncology | Admitting: Oncology

## 2017-09-10 ENCOUNTER — Inpatient Hospital Stay: Payer: Medicare Other | Attending: Nurse Practitioner | Admitting: Oncology

## 2017-09-10 DIAGNOSIS — J438 Other emphysema: Secondary | ICD-10-CM | POA: Diagnosis not present

## 2017-09-10 DIAGNOSIS — Z87891 Personal history of nicotine dependence: Secondary | ICD-10-CM

## 2017-09-10 DIAGNOSIS — I700 Atherosclerosis of aorta: Secondary | ICD-10-CM | POA: Insufficient documentation

## 2017-09-10 DIAGNOSIS — Z122 Encounter for screening for malignant neoplasm of respiratory organs: Secondary | ICD-10-CM | POA: Diagnosis not present

## 2017-09-10 DIAGNOSIS — I2510 Atherosclerotic heart disease of native coronary artery without angina pectoris: Secondary | ICD-10-CM | POA: Diagnosis not present

## 2017-09-10 DIAGNOSIS — J432 Centrilobular emphysema: Secondary | ICD-10-CM | POA: Insufficient documentation

## 2017-09-10 NOTE — Progress Notes (Signed)
In accordance with CMS guidelines, patient has met eligibility criteria including age, absence of signs or symptoms of lung cancer.  Social History   Tobacco Use  . Smoking status: Former Smoker    Packs/day: 1.50    Years: 53.00    Pack years: 79.50    Types: Cigarettes    Last attempt to quit: 04/18/2015    Years since quitting: 2.4  . Smokeless tobacco: Never Used  Substance Use Topics  . Alcohol use: Not Currently  . Drug use: No     A shared decision-making session was conducted prior to the performance of CT scan. This includes one or more decision aids, includes benefits and harms of screening, follow-up diagnostic testing, over-diagnosis, false positive rate, and total radiation exposure.  Counseling on the importance of adherence to annual lung cancer LDCT screening, impact of co-morbidities, and ability or willingness to undergo diagnosis and treatment is imperative for compliance of the program.  Counseling on the importance of continued smoking cessation for former smokers; the importance of smoking cessation for current smokers, and information about tobacco cessation interventions have been given to patient including [place] and [facility] quit [place] programs.  Written order for lung cancer screening with LDCT has been given to the patient and any and all questions have been answered to the best of my abilities.   Yearly follow up will be coordinated by Burgess Estelle, Thoracic Navigator.  Faythe Casa, NP 09/10/2017 1:11 PM

## 2017-09-11 ENCOUNTER — Encounter: Payer: Self-pay | Admitting: *Deleted

## 2018-05-28 ENCOUNTER — Other Ambulatory Visit: Payer: Self-pay

## 2018-05-28 ENCOUNTER — Ambulatory Visit
Admission: EM | Admit: 2018-05-28 | Discharge: 2018-05-28 | Disposition: A | Discharge status: Home / Self Care | Payer: Medicare Other | Attending: Family Medicine | Admitting: Family Medicine

## 2018-05-28 DIAGNOSIS — S60511A Abrasion of right hand, initial encounter: Secondary | ICD-10-CM

## 2018-05-28 DIAGNOSIS — Z23 Encounter for immunization: Secondary | ICD-10-CM

## 2018-05-28 DIAGNOSIS — L0390 Cellulitis, unspecified: Secondary | ICD-10-CM

## 2018-05-28 DIAGNOSIS — W309XXA Contact with unspecified agricultural machinery, initial encounter: Secondary | ICD-10-CM | POA: Diagnosis not present

## 2018-05-28 DIAGNOSIS — S60512A Abrasion of left hand, initial encounter: Secondary | ICD-10-CM

## 2018-05-28 MED ORDER — TETANUS-DIPHTH-ACELL PERTUSSIS 5-2.5-18.5 LF-MCG/0.5 IM SUSP
0.5000 mL | Freq: Once | INTRAMUSCULAR | Status: AC
  Administered 2018-05-28: 16:00:00 0.5 mL via INTRAMUSCULAR

## 2018-05-28 MED ORDER — SULFAMETHOXAZOLE-TRIMETHOPRIM 800-160 MG PO TABS: 1.0000 | ORAL_TABLET | Freq: Two times a day (BID) | ORAL | 0 refills | Status: DC

## 2018-05-28 NOTE — ED Triage Notes (Signed)
Patient complains of laceration on his right hand that occurred while working on [place] equipment [place]. Patient states that he just feels like he needs a tetanus shot.

## 2018-05-28 NOTE — ED Provider Notes (Signed)
[facility]    CSN: [id number] Arrival date & time: 05/28/18  1501     History   Chief Complaint Chief Complaint  Patient presents with  . Laceration    HPI Kirk Rodriguez is a 73 y.o. male.   73 yo male with a c/o right hand skin abrasion several days ago while working on a piece of farm equipment. Does not recall his last tetanus. States has been cleaning wound but has noticed some slight drainage.    Laceration    Past Medical History:  Diagnosis Date  . Allergy   . Cancer ([place])    Larynx--carcinoma in situ  . Hyperlipidemia   . Wears dentures    full upper, partial lower    Patient Active Problem List   Diagnosis Date Noted  . COPD (chronic obstructive pulmonary disease) ([place]) 12/04/2015  . History of chickenpox 12/04/2015  . Hyperlipidemia, unspecified 12/04/2015  . Hypertension 12/04/2015  . Non-recurrent bilateral inguinal hernia without obstruction or gangrene 12/04/2015  . Current smoker 09/30/2013  . Trigger finger 06/09/2013    Past Surgical History:  Procedure Laterality Date  . DIRECT LARYNGOSCOPY Right 04/03/2016   Procedure: DIRECT MICROLARYNGOSCOPY WITH EXCISON RIGHT VOCAL CORD LESION;  Surgeon: Margaretha Sheffield, MD;  Location: [facility];  Service: ENT;  Laterality: Right;  RIGHT   . INGUINAL HERNIA REPAIR Bilateral 12/18/2015   Procedure: LAPAROSCOPIC BILATERAL INGUINAL HERNIA REPAIR;  Surgeon: Hubbard Robinson, MD;  Location: [facility];  Service: General;  Laterality: Bilateral;  . MINOR EXCISION OF ORAL LESION Left 03/06/2016   Procedure: direct microlaryngoscopy with excision left vocal cord lesion;  Surgeon: Margaretha Sheffield, MD;  Location: [facility];  Service: ENT;  Laterality: Left;  left vocal cord lesion  . TRIGGER FINGER RELEASE Left 2016  . VASECTOMY Bilateral 1978       Home Medications    Prior to Admission medications   Medication Sig Start Date End Date Taking? Authorizing Provider  aspirin EC 81 MG  tablet Take 81 mg by mouth daily.    Yes [provider]  Cholecalciferol (VITAMIN D3) 2000 units capsule Take 2,000 Units by mouth daily.    Yes [provider]  Multiple Vitamin (MULTI-VITAMINS) TABS Take 1 tablet by mouth daily.    Yes [provider]  simvastatin (ZOCOR) 80 MG tablet Take 80 mg by mouth daily at 6 PM.  11/25/15  Yes [provider]  triamcinolone (NASACORT AQ) 55 MCG/ACT AERO nasal inhaler Place 2 sprays into the nose daily.   Yes [provider]  doxycycline (VIBRA-TABS) 100 MG tablet Take 1 tablet (100 mg total) by mouth 2 (two) times daily. 04/18/17   Norval Gable, MD  neomycin-bacitracin-polymyxin (NEOSPORIN) ointment Apply 1 application topically every 12 (twelve) hours. apply to eye 08/03/16   Betancourt, Aura Fey, NP  sulfamethoxazole-trimethoprim (BACTRIM DS) 800-160 MG tablet Take 1 tablet by mouth 2 (two) times daily. 05/28/18   Norval Gable, MD    Family History Family History  Problem Relation Age of Onset  . Lung cancer Father   . Heart disease Father   . Hypertension Father   . Polycythemia Mother   . Heart disease Mother     Social History Social History   Tobacco Use  . Smoking status: Former Smoker    Packs/day: 1.50    Years: 53.00    Pack years: 79.50    Types: Cigarettes    Last attempt to quit: 04/18/2015  Years since quitting: 3.1  . Smokeless tobacco: Never Used  Substance Use Topics  . Alcohol use: Not Currently  . Drug use: No     Allergies   Patient has no known allergies.   Review of Systems Review of Systems   Physical Exam Triage Vital Signs ED Triage Vitals  Enc Vitals Group     BP 05/28/18 1515 121/82     Pulse Rate 05/28/18 1515 72     Resp 05/28/18 1515 16     Temp 05/28/18 1515 98.4 F (36.9 C)     Temp Source 05/28/18 1515 Oral     SpO2 05/28/18 1515 97 %     Weight 05/28/18 1513 192 lb (87.1 kg)     Height 05/28/18 1513 5' 11.5" (1.816 m)     Head  Circumference --      Peak Flow --      Pain Score 05/28/18 1513 1     Pain Loc --      Pain Edu? --      Excl. in [place]? --    No data found.  Updated Vital Signs BP 121/82   Pulse 72   Temp 98.4 F (36.9 C) (Oral)   Resp 16   Ht 5' 11.5" (1.816 m)   Wt 87.1 kg   SpO2 97%   BMI 26.41 kg/m   Visual Acuity Right Eye Distance:   Left Eye Distance:   Bilateral Distance:    Right Eye Near:   Left Eye Near:    Bilateral Near:     Physical Exam Vitals signs and nursing note reviewed.  Constitutional:      General: He is not in acute distress.    Appearance: He is not toxic-appearing or diaphoretic.  Musculoskeletal:     Right hand: He exhibits normal range of motion and no bony tenderness. Normal sensation noted. Normal strength noted.     Comments: Superficial skin abrasion with mild surrounding erythema and mild drainage on dorsum of right hand  Neurological:     Mental Status: He is alert.      UC Treatments / Results  Labs (all labs ordered are listed, but only abnormal results are displayed) Labs Reviewed - No data to display  EKG None  Radiology No results found.  Procedures Procedures (including critical care time)  Medications Ordered in UC Medications  Tdap (BOOSTRIX) injection 0.5 mL (0.5 mLs Intramuscular Given 05/28/18 1541)    Initial Impression / Assessment and Plan / UC Course  I have reviewed the triage vital signs and the nursing notes.  Pertinent labs & imaging results that were available during my care of the patient were reviewed by me and considered in my medical decision making (see chart for details).      Final Clinical Impressions(s) / UC Diagnoses   Final diagnoses:  Abrasion of left hand, initial encounter  Cellulitis, unspecified cellulitis site    ED Prescriptions    Medication Sig Dispense Auth. Provider   sulfamethoxazole-trimethoprim (BACTRIM DS) 800-160 MG tablet Take 1 tablet by mouth 2 (two) times daily. 14  tablet Norval Gable, MD     1. diagnosis reviewed with patient; given tdap 2. rx as per orders above; reviewed possible side effects, interactions, risks and benefits  3. Recommend supportive treatment continued wound care 4. Follow-up prn if symptoms worsen or don't improve   Controlled Substance Prescriptions [place] Controlled Substance Registry consulted? Not Applicable   Norval Gable, MD 05/28/18 [phone]

## 2018-08-18 ENCOUNTER — Ambulatory Visit
Admission: RE | Admit: 2018-08-18 | Discharge: 2018-08-18 | Disposition: A | Discharge status: Home / Self Care | Payer: Medicare Other | Source: Ambulatory Visit | Attending: Radiation Oncology | Admitting: Radiation Oncology

## 2018-08-18 ENCOUNTER — Encounter: Payer: Self-pay | Admitting: Radiation Oncology

## 2018-08-18 ENCOUNTER — Other Ambulatory Visit: Payer: Self-pay

## 2018-08-18 ENCOUNTER — Encounter (INDEPENDENT_AMBULATORY_CARE_PROVIDER_SITE_OTHER): Payer: Self-pay

## 2018-08-18 DIAGNOSIS — Z8521 Personal history of malignant neoplasm of larynx: Secondary | ICD-10-CM | POA: Insufficient documentation

## 2018-08-18 DIAGNOSIS — Z923 Personal history of irradiation: Secondary | ICD-10-CM | POA: Insufficient documentation

## 2018-08-18 DIAGNOSIS — D020 Carcinoma in situ of larynx: Secondary | ICD-10-CM

## 2018-08-18 NOTE — Progress Notes (Signed)
Radiation Oncology Follow up Note  Name: Kirk Rodriguez   Date:   08/18/2018 MRN:  [id number] DOB: 07-25-1945    This 73 y.o. 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  REFERRING PROVIDER: Barbaraann Boys, MD  HPI: Patient is a 73 year old male now about 2 years having completed external beam radiation therapy for stage I squamous cell carcinoma the larynx seen today in routine follow-up he is doing well.  He specifically denies any head and neck pain dysphasia.  He has excellent tonal quality to his voice.  He has been seeing ENT and he reports no evidence of disease from those providers..  Patient did have a screening CT scan of his chest back in September which I have reviewed showing no evidence of lung pathology  COMPLICATIONS OF TREATMENT: none  FOLLOW UP COMPLIANCE: keeps appointments   PHYSICAL EXAM:  BP (!) (P) 126/94 (BP Location: Left Arm, Patient Position: Sitting)   Pulse (P) 68   Temp (!) (P) 97 F (36.1 C) (Tympanic)   Resp (P) 18   Wt (P) 190 lb 12.8 oz (86.5 kg)   BMI (P) 26.24 kg/m  Well-developed well-nourished patient in NAD. HEENT reveals PERLA, EOMI, discs not visualized.  Oral cavity is clear. No oral mucosal lesions are identified. Neck is clear without evidence of cervical or supraclavicular adenopathy. Lungs are clear to A&P. Cardiac examination is essentially unremarkable with regular rate and rhythm without murmur rub or thrill. Abdomen is benign with no organomegaly or masses noted. Motor sensory and DTR levels are equal and symmetric in the upper and lower extremities. Cranial nerves II through XII are grossly intact. Proprioception is intact. No peripheral adenopathy or edema is identified. No motor or sensory levels are noted. Crude visual fields are within normal range.  RADIOLOGY RESULTS: Screening CT scan reviewed showing no evidence of lung  pathology.  PLAN: Present time patient is doing well with no evidence of disease.  I am pleased with his overall progress.  I have asked to see him back in 1 year for follow-up.  He continues close follow-up care with ENT.  Patient knows to call with any concerns.  I would like to take this opportunity to thank you for allowing me to participate in the care of your patient.Noreene Filbert, MD

## 2018-09-20 ENCOUNTER — Telehealth: Payer: Self-pay | Admitting: *Deleted

## 2018-09-20 DIAGNOSIS — Z87891 Personal history of nicotine dependence: Secondary | ICD-10-CM

## 2018-09-20 DIAGNOSIS — Z122 Encounter for screening for malignant neoplasm of respiratory organs: Secondary | ICD-10-CM

## 2018-09-20 NOTE — Telephone Encounter (Signed)
Patient has been notified that lung cancer screening CT scan is due currently or will be in the near future. Confirmed that patient is within the appropriate age range and asymptomatic (no signs or symptoms of lung cancer). Patient denies illness that would prevent curative treatment for lung cancer if found. Verified smoking history (former smoker 1.5 ppd). Patient is agreeable for CT scan being scheduled and has no preference regarding date or time.

## 2018-09-21 NOTE — Telephone Encounter (Signed)
Former smoker, quit 04/2015, 79.5 pack year

## 2018-09-21 NOTE — Addendum Note (Signed)
Addended by: Lieutenant Diego on: 09/21/2018 12:23 PM   Modules accepted: Orders

## 2018-09-29 ENCOUNTER — Other Ambulatory Visit: Payer: Self-pay

## 2018-09-29 ENCOUNTER — Ambulatory Visit
Admission: RE | Admit: 2018-09-29 | Discharge: 2018-09-29 | Disposition: A | Discharge status: Home / Self Care | Payer: Medicare Other | Source: Ambulatory Visit | Attending: Oncology | Admitting: Oncology

## 2018-09-29 DIAGNOSIS — Z122 Encounter for screening for malignant neoplasm of respiratory organs: Secondary | ICD-10-CM

## 2018-09-29 DIAGNOSIS — Z87891 Personal history of nicotine dependence: Secondary | ICD-10-CM | POA: Diagnosis present

## 2018-10-06 ENCOUNTER — Encounter: Payer: Self-pay | Admitting: *Deleted

## 2018-11-18 DIAGNOSIS — D020 Carcinoma in situ of larynx: Secondary | ICD-10-CM | POA: Insufficient documentation

## 2019-05-04 ENCOUNTER — Other Ambulatory Visit: Payer: Self-pay

## 2019-05-04 ENCOUNTER — Encounter: Payer: Self-pay | Admitting: Gastroenterology

## 2019-05-04 ENCOUNTER — Telehealth (INDEPENDENT_AMBULATORY_CARE_PROVIDER_SITE_OTHER): Payer: Medicare PPO | Admitting: Gastroenterology

## 2019-05-04 DIAGNOSIS — R195 Other fecal abnormalities: Secondary | ICD-10-CM

## 2019-05-04 MED ORDER — NA SULFATE-K SULFATE-MG SULF 17.5-3.13-1.6 GM/177ML PO SOLN: 354.0000 mL | Freq: Once | ORAL | 0 refills | Status: AC

## 2019-05-04 NOTE — Addendum Note (Signed)
Addended by: Wayna Chalet on: 05/04/2019 01:56 PM   Modules accepted: Orders, SmartSet

## 2019-05-04 NOTE — Progress Notes (Signed)
[name] [address]  [place]  [place], [place] [address]  Main: [phone]  Fax: [phone]   Gastroenterology Consultation  Referring Provider:     Barbaraann Boys, MD Primary Care Physician:  Barbaraann Boys, MD Reason for Consultation:   FIT  Test positive        HPI:   Virtual Visit via Telephone Note  I connected with patient on 05/04/19 at 11:15 AM EDT by telephone and verified that I am speaking with the correct person using two identifiers.   I discussed the limitations, risks, security and privacy concerns of performing an evaluation and management service by telephone and the availability of in person appointments. I also discussed with the patient that there may be a patient responsible charge related to this service. The patient expressed understanding and agreed to proceed.  Location of the patient: Home Location of provider: Home Participating persons: Patient and provider only   History of Present Illness: Chief Complaint  Patient presents with  . Positive FIT test    Patient stated that he had not noticed any blood on his stool or urine     Kirk Rodriguez is a 74 y.o. y/o male referred for consultation & management  by Dr. Barbaraann Boys, MD.  Patient reports having yearly colorectal cancer screening with stool testing and this is the first time it has been positive.  See care everywhere for results, fit test positive x3. The patient denies abdominal or flank pain, anorexia, nausea or vomiting, dysphagia, change in bowel habits or black or bloody stools or weight loss.  No prior EGD or colonoscopy.  No family history of colon cancer.  Past Medical History:  Diagnosis Date  . Allergy   . Cancer ([place])    Larynx--carcinoma in situ  . Hyperlipidemia   . Wears dentures    full upper, partial lower    Past Surgical History:  Procedure Laterality Date  . DIRECT LARYNGOSCOPY Right 04/03/2016   Procedure: DIRECT MICROLARYNGOSCOPY WITH  EXCISON RIGHT VOCAL CORD LESION;  Surgeon: Margaretha Sheffield, MD;  Location: [facility];  Service: ENT;  Laterality: Right;  RIGHT   . INGUINAL HERNIA REPAIR Bilateral 12/18/2015   Procedure: LAPAROSCOPIC BILATERAL INGUINAL HERNIA REPAIR;  Surgeon: Hubbard Robinson, MD;  Location: [facility];  Service: General;  Laterality: Bilateral;  . MINOR EXCISION OF ORAL LESION Left 03/06/2016   Procedure: direct microlaryngoscopy with excision left vocal cord lesion;  Surgeon: Margaretha Sheffield, MD;  Location: [facility];  Service: ENT;  Laterality: Left;  left vocal cord lesion  . TRIGGER FINGER RELEASE Left 2016  . VASECTOMY Bilateral 1978    Prior to Admission medications   Medication Sig Start Date End Date Taking? Authorizing Provider  aspirin EC 81 MG tablet Take 81 mg by mouth daily.    Yes [provider]  Cholecalciferol (VITAMIN D3) 2000 units capsule Take 2,000 Units by mouth daily.    Yes [provider]  Multiple Vitamin (MULTI-VITAMINS) TABS Take 1 tablet by mouth daily.    Yes [provider]  neomycin-bacitracin-polymyxin (NEOSPORIN) ointment Apply 1 application topically every 12 (twelve) hours. apply to eye 08/03/16  Yes Betancourt, Aura Fey, NP  simvastatin (ZOCOR) 80 MG tablet Take 80 mg by mouth daily at 6 PM.  11/25/15  Yes [provider]  triamcinolone (NASACORT AQ) 55 MCG/ACT AERO nasal inhaler Place 2 sprays into the nose daily.   Yes [provider]    Family History  Problem Relation Age of Onset  . Lung cancer Father   . Heart disease Father   . Hypertension Father   . Polycythemia Mother   . Heart disease Mother      Social History   Tobacco Use  . Smoking status: Former Smoker    Packs/day: 1.50    Years: 53.00    Pack years: 79.50    Types: Cigarettes    Quit date: 04/18/2015    Years since quitting: 4.0  . Smokeless tobacco: Never Used  Substance Use Topics  . Alcohol use: Not Currently  . Drug use: No     Allergies as of 05/04/2019  . (No Known Allergies)    Review of Systems:    All systems reviewed and negative except where noted in HPI.   Observations/Objective:  Labs: CBC    Component Value Date/Time   WBC 9.9 06/17/2016 1310   RBC 4.42 06/17/2016 1310   HGB 13.7 06/17/2016 1310   HCT 39.7 (L) 06/17/2016 1310   PLT 282 06/17/2016 1310   MCV 89.8 06/17/2016 1310   MCH 31.0 06/17/2016 1310   MCHC 34.5 06/17/2016 1310   RDW 14.2 06/17/2016 1310   CMP     Component Value Date/Time   CREATININE 1.10 04/29/2016 0835    Imaging Studies: No results found.  Assessment and Plan:   Kirk Rodriguez is a 74 y.o. y/o male has been referred for positive fit test  Assessment and Plan: Colonoscopy indicated further evaluation and rule out malignancy  I have discussed alternative options, risks & benefits,  which include, but are not limited to, bleeding, infection, perforation,respiratory complication & drug reaction.  The patient agrees with this plan & written consent will be obtained.     Follow Up Instructions:   I discussed the assessment and treatment plan with the patient. The patient was provided an opportunity to ask questions and all were answered. The patient agreed with the plan and demonstrated an understanding of the instructions.   The patient was advised to call back or seek an in-person evaluation if the symptoms worsen or if the condition fails to improve as anticipated.  I provided 12 minutes of non-face-to-face time during this encounter.   Virgel Manifold, MD  Speech recognition software was used to dictate the above note.

## 2019-05-04 NOTE — Addendum Note (Signed)
Addended by: Wayna Chalet on: 05/04/2019 02:02 PM   Modules accepted: Orders

## 2019-05-05 ENCOUNTER — Telehealth: Payer: Self-pay

## 2019-05-05 NOTE — Telephone Encounter (Signed)
Called patient back and I told him that we had not called him this AM. Patient stated that if one of [place] calls him back that he will try to answer the call.

## 2019-05-05 NOTE — Telephone Encounter (Signed)
Pt returned call stating he missed the call and now has his cell phone ringer on loud. [phone].

## 2019-05-31 ENCOUNTER — Other Ambulatory Visit
Admission: RE | Admit: 2019-05-31 | Discharge: 2019-05-31 | Disposition: A | Discharge status: Home / Self Care | Payer: Medicare PPO | Source: Ambulatory Visit | Attending: Gastroenterology | Admitting: Gastroenterology

## 2019-05-31 ENCOUNTER — Other Ambulatory Visit: Payer: Self-pay

## 2019-05-31 DIAGNOSIS — Z01812 Encounter for preprocedural laboratory examination: Secondary | ICD-10-CM | POA: Insufficient documentation

## 2019-05-31 DIAGNOSIS — Z20822 Contact with and (suspected) exposure to covid-19: Secondary | ICD-10-CM | POA: Insufficient documentation

## 2019-05-31 LAB — SARS CORONAVIRUS 2 (TAT 6-24 HRS): SARS Coronavirus 2: NEGATIVE

## 2019-06-02 ENCOUNTER — Ambulatory Visit: Payer: Medicare PPO | Admitting: Certified Registered Nurse Anesthetist

## 2019-06-02 ENCOUNTER — Encounter: Payer: Self-pay | Admitting: Gastroenterology

## 2019-06-02 ENCOUNTER — Encounter
Admission: RE | Disposition: A | Discharge status: Home / Self Care | Payer: Self-pay | Source: Home / Self Care | Attending: Gastroenterology

## 2019-06-02 ENCOUNTER — Ambulatory Visit
Admission: RE | Admit: 2019-06-02 | Discharge: 2019-06-02 | Disposition: A | Discharge status: Home / Self Care | Payer: Medicare PPO | Attending: Gastroenterology | Admitting: Gastroenterology

## 2019-06-02 DIAGNOSIS — K5730 Diverticulosis of large intestine without perforation or abscess without bleeding: Secondary | ICD-10-CM | POA: Diagnosis not present

## 2019-06-02 DIAGNOSIS — Z8249 Family history of ischemic heart disease and other diseases of the circulatory system: Secondary | ICD-10-CM | POA: Insufficient documentation

## 2019-06-02 DIAGNOSIS — Z9852 Vasectomy status: Secondary | ICD-10-CM | POA: Insufficient documentation

## 2019-06-02 DIAGNOSIS — D490 Neoplasm of unspecified behavior of digestive system: Secondary | ICD-10-CM

## 2019-06-02 DIAGNOSIS — Z87891 Personal history of nicotine dependence: Secondary | ICD-10-CM | POA: Diagnosis not present

## 2019-06-02 DIAGNOSIS — E785 Hyperlipidemia, unspecified: Secondary | ICD-10-CM | POA: Insufficient documentation

## 2019-06-02 DIAGNOSIS — Z79899 Other long term (current) drug therapy: Secondary | ICD-10-CM | POA: Insufficient documentation

## 2019-06-02 DIAGNOSIS — D120 Benign neoplasm of cecum: Secondary | ICD-10-CM | POA: Insufficient documentation

## 2019-06-02 DIAGNOSIS — D125 Benign neoplasm of sigmoid colon: Secondary | ICD-10-CM | POA: Insufficient documentation

## 2019-06-02 DIAGNOSIS — K635 Polyp of colon: Secondary | ICD-10-CM

## 2019-06-02 DIAGNOSIS — C19 Malignant neoplasm of rectosigmoid junction: Secondary | ICD-10-CM | POA: Insufficient documentation

## 2019-06-02 DIAGNOSIS — Z801 Family history of malignant neoplasm of trachea, bronchus and lung: Secondary | ICD-10-CM | POA: Insufficient documentation

## 2019-06-02 DIAGNOSIS — R195 Other fecal abnormalities: Secondary | ICD-10-CM

## 2019-06-02 DIAGNOSIS — D123 Benign neoplasm of transverse colon: Secondary | ICD-10-CM | POA: Insufficient documentation

## 2019-06-02 DIAGNOSIS — Z7982 Long term (current) use of aspirin: Secondary | ICD-10-CM | POA: Insufficient documentation

## 2019-06-02 DIAGNOSIS — D122 Benign neoplasm of ascending colon: Secondary | ICD-10-CM | POA: Insufficient documentation

## 2019-06-02 DIAGNOSIS — D124 Benign neoplasm of descending colon: Secondary | ICD-10-CM | POA: Diagnosis not present

## 2019-06-02 HISTORY — PX: COLONOSCOPY WITH PROPOFOL: SHX5780

## 2019-06-02 SURGERY — COLONOSCOPY WITH PROPOFOL
Anesthesia: General

## 2019-06-02 MED ORDER — PROPOFOL 500 MG/50ML IV EMUL
INTRAVENOUS | Status: AC
  Filled 2019-06-02: qty 50

## 2019-06-02 MED ORDER — PHENYLEPHRINE HCL (PRESSORS) 10 MG/ML IV SOLN
INTRAVENOUS | Status: DC | PRN
  Administered 2019-06-02 (×5): 100 ug via INTRAVENOUS

## 2019-06-02 MED ORDER — SODIUM CHLORIDE 0.9 % IV SOLN: INTRAVENOUS | Status: DC

## 2019-06-02 MED ORDER — PROPOFOL 500 MG/50ML IV EMUL
INTRAVENOUS | Status: DC | PRN
  Administered 2019-06-02: 150 ug/kg/min via INTRAVENOUS

## 2019-06-02 MED ORDER — PROPOFOL 10 MG/ML IV BOLUS
INTRAVENOUS | Status: AC
  Filled 2019-06-02: qty 20

## 2019-06-02 MED ORDER — PROPOFOL 10 MG/ML IV BOLUS
INTRAVENOUS | Status: DC | PRN
  Administered 2019-06-02: 60 mg via INTRAVENOUS

## 2019-06-02 MED ORDER — LIDOCAINE HCL (CARDIAC) PF 100 MG/5ML IV SOSY
PREFILLED_SYRINGE | INTRAVENOUS | Status: DC | PRN
  Administered 2019-06-02: 50 mg via INTRAVENOUS

## 2019-06-02 MED ORDER — SPOT INK MARKER SYRINGE KIT
PACK | SUBMUCOSAL | Status: DC | PRN
  Administered 2019-06-02: 10 mL via SUBMUCOSAL

## 2019-06-02 NOTE — H&P (Signed)
Kirk Antigua, MD [address], [place], [place], [place], [address] [address], [place], [place], [place], [address] Phone: [phone]  Fax: [phone]  Primary Care Physician:  Kirk Boys, MD   Pre-Procedure History & Physical: HPI:  Kirk Rodriguez is a 74 y.o. male is here for a colonoscopy.   Past Medical History:  Diagnosis Date  . Allergy   . Cancer ([place])    Larynx--carcinoma in situ  . Hyperlipidemia   . Wears dentures    full upper, partial lower    Past Surgical History:  Procedure Laterality Date  . DIRECT LARYNGOSCOPY Right 04/03/2016   Procedure: DIRECT MICROLARYNGOSCOPY WITH EXCISON RIGHT VOCAL CORD LESION;  Surgeon: Margaretha Sheffield, MD;  Location: [facility];  Service: ENT;  Laterality: Right;  RIGHT   . INGUINAL HERNIA REPAIR Bilateral 12/18/2015   Procedure: LAPAROSCOPIC BILATERAL INGUINAL HERNIA REPAIR;  Surgeon: Hubbard Robinson, MD;  Location: [facility];  Service: General;  Laterality: Bilateral;  . MINOR EXCISION OF ORAL LESION Left 03/06/2016   Procedure: direct microlaryngoscopy with excision left vocal cord lesion;  Surgeon: Margaretha Sheffield, MD;  Location: [facility];  Service: ENT;  Laterality: Left;  left vocal cord lesion  . TRIGGER FINGER RELEASE Left 2016  . VASECTOMY Bilateral 1978    Prior to Admission medications   Medication Sig Start Date End Date Taking? Authorizing Provider  aspirin EC 81 MG tablet Take 81 mg by mouth daily.     [provider]  Cholecalciferol (VITAMIN D3) 2000 units capsule Take 2,000 Units by mouth daily.     [provider]  Multiple Vitamin (MULTI-VITAMINS) TABS Take 1 tablet by mouth daily.     [provider]  neomycin-bacitracin-polymyxin (NEOSPORIN) ointment Apply 1 application topically every 12 (twelve) hours. apply to eye 08/03/16   Betancourt, Aura Fey, NP  simvastatin (ZOCOR) 80 MG tablet Take 80 mg by mouth daily at 6 PM.  11/25/15   [provider]   triamcinolone (NASACORT AQ) 55 MCG/ACT AERO nasal inhaler Place 2 sprays into the nose daily.    [provider]    Allergies as of 05/04/2019  . (No Known Allergies)    Family History  Problem Relation Age of Onset  . Lung cancer Father   . Heart disease Father   . Hypertension Father   . Polycythemia Mother   . Heart disease Mother     Social History   Socioeconomic History  . Marital status: Married    Spouse name: Not on file  . Number of children: Not on file  . Years of education: Not on file  . Highest education level: Not on file  Occupational History  . Not on file  Tobacco Use  . Smoking status: Former Smoker    Packs/day: 1.50    Years: 53.00    Pack years: 79.50    Types: Cigarettes    Quit date: 04/18/2015    Years since quitting: 4.1  . Smokeless tobacco: Never Used  Substance and Sexual Activity  . Alcohol use: Not Currently  . Drug use: No  . Sexual activity: Not on file  Other Topics Concern  . Not on file  Social History Narrative  . Not on file   Social Determinants of Health   Financial Resource Strain:   . Difficulty of Paying Living Expenses:   Food Insecurity:   . Worried About Charity fundraiser in the Last Year:   . [employer] of  Food in the Last Year:   Transportation Needs:   . Film/video editor (Medical):   Marland Kitchen Lack of Transportation (Non-Medical):   Physical Activity:   . Days of Exercise per Week:   . Minutes of Exercise per Session:   Stress:   . Feeling of Stress :   Social Connections:   . Frequency of Communication with Friends and Family:   . Frequency of Social Gatherings with Friends and Family:   . Attends Religious Services:   . Active Member of Clubs or Organizations:   . Attends Archivist Meetings:   Marland Kitchen Marital Status:   Intimate Partner Violence:   . Fear of Current or Ex-Partner:   . Emotionally Abused:   Marland Kitchen Physically Abused:   . Sexually Abused:     Review of Systems: See HPI,  otherwise negative ROS  Physical Exam: BP 116/84   Pulse 76   Temp (!) 97 F (36.1 C) (Tympanic)   Resp 18   Ht 6\' 1"  (1.854 m)   Wt 87.1 kg   SpO2 100%   BMI 25.33 kg/m  General:   Alert,  pleasant and cooperative in NAD Head:  Normocephalic and atraumatic. Neck:  Supple; no masses or thyromegaly. Lungs:  Clear throughout to auscultation, normal respiratory effort.    Heart:  +S1, +S2, Regular rate and rhythm, No edema. Abdomen:  Soft, nontender and nondistended. Normal bowel sounds, without guarding, and without rebound.   Neurologic:  Alert and  oriented x4;  grossly normal neurologically.  Impression/Plan: Kirk Rodriguez is here for a colonoscopy to be performed for positive hemoccult test.  Risks, benefits, limitations, and alternatives regarding  colonoscopy have been reviewed with the patient.  Questions have been answered.  All parties agreeable.   Virgel Manifold, MD  06/02/2019, 8:47 AM

## 2019-06-02 NOTE — Anesthesia Preprocedure Evaluation (Signed)
Anesthesia Evaluation  Patient identified by MRN, date of birth, ID band Patient awake    Reviewed: Allergy & Precautions, H&P , NPO status , Patient's Chart, lab work & pertinent test results, reviewed documented beta blocker date and time   History of Anesthesia Complications Negative for: history of anesthetic complications  Airway Mallampati: II  TM Distance: >3 FB Neck ROM: full    Dental  (+) Edentulous Upper, Partial Lower, Dental Advidsory Given   Pulmonary neg pulmonary ROS, neg shortness of breath, COPD, neg recent URI, former smoker,    Pulmonary exam normal        Cardiovascular Exercise Tolerance: Good negative cardio ROS Normal cardiovascular exam Rhythm:regular Rate:Normal     Neuro/Psych negative neurological ROS  negative psych ROS   GI/Hepatic Neg liver ROS, GERD  ,  Endo/Other  negative endocrine ROS  Renal/GU negative Renal ROS  negative genitourinary   Musculoskeletal   Abdominal   Peds  Hematology negative hematology ROS (+)   Anesthesia Other Findings Past Medical History: No date: Allergy No date: Hyperlipidemia Past Surgical History: 2016: TRIGGER FINGER RELEASE Left 1978: VASECTOMY Bilateral BMI    Body Mass Index:  23.22 kg/m     Reproductive/Obstetrics negative OB ROS                             Anesthesia Physical  Anesthesia Plan  ASA: II  Anesthesia Plan: General   Post-op Pain Management:    Induction: Intravenous  PONV Risk Score and Plan: 2 and TIVA and Propofol infusion  Airway Management Planned: Natural Airway and Nasal Cannula  Additional Equipment:   Intra-op Plan:   Post-operative Plan:   Informed Consent: I have reviewed the patients History and Physical, chart, labs and discussed the procedure including the risks, benefits and alternatives for the proposed anesthesia with the patient or authorized representative who has  indicated his/her understanding and acceptance.     Dental Advisory Given  Plan Discussed with: CRNA  Anesthesia Plan Comments:         Anesthesia Quick Evaluation

## 2019-06-02 NOTE — Op Note (Signed)
[facility] Gastroenterology Patient Name: Kirk Rodriguez Procedure Date: 06/02/2019 8:41 AM MRN: [id number] Account #: [id number] Date of Birth: 02-02-1945 Admit Type: Outpatient Age: 74 Room: [facility] ENDO ROOM 3 Gender: Male Note Status: Finalized Procedure:             Colonoscopy Indications:           Heme positive stool Providers:             Elih Mooney B. Bonna Gains MD, MD Referring MD:          Ane Payment, MD (Referring MD) Medicines:             Monitored Anesthesia Care Complications:         No immediate complications. Procedure:             Pre-Anesthesia Assessment:                        - ASA Grade Assessment: II - A patient with mild                         systemic disease.                        - Prior to the procedure, a History and Physical was                         performed, and patient medications, allergies and                         sensitivities were reviewed. The patient's tolerance                         of previous anesthesia was reviewed.                        - The risks and benefits of the procedure and the                         sedation options and risks were discussed with the                         patient. All questions were answered and informed                         consent was obtained.                        - Patient identification and proposed procedure were                         verified prior to the procedure by the physician, the                         nurse, the anesthesiologist, the anesthetist and the                         technician. The procedure was verified in the                         procedure room.  After obtaining informed consent, the colonoscope was                         passed under direct vision. Throughout the procedure,                         the patient's blood pressure, pulse, and oxygen                         saturations were monitored continuously. The                        Colonoscope was introduced through the anus and                         advanced to the the cecum, identified by appendiceal                         orifice and ileocecal valve. The colonoscopy was                         performed with ease. The patient tolerated the                         procedure well. The quality of the bowel preparation                         was fair. Findings:      The perianal and digital rectal examinations were normal.      Six sessile polyps were found in the transverse colon, ascending colon       and cecum. The polyps were 5 to 8 mm in size. These polyps were removed       with a cold snare. Resection and retrieval were complete.      Two sessile and semi-pedunculated, non-bleeding polyps were found in the       sigmoid colon and descending colon. The polyps were 5 to 7 mm in size.       The polyp was removed with a hot snare and The polyp was removed with a       cold snare. Resection and retrieval were complete.      An ulcerated non-obstructing large mass was found at 15 cm proximal to       the anus. The mass was non-circumferential. No bleeding was present.       This was biopsied with a cold forceps for histology. The biopsies were       done at the edges and not in the central ulcerated area Area was       tattooed with an injection of Spot (carbon black).      A 10 mm polyp was found in the sigmoid colon. The polyp was       pedunculated. The polyp was removed with a hot snare. Resection and       retrieval were complete.      The exam was otherwise without abnormality.      Multiple diverticula were found in the sigmoid colon.      The retroflexed view of the distal rectum and anal verge was normal and       showed no anal or rectal abnormalities. Impression:            -  Preparation of the colon was fair.                        - Six 5 to 8 mm polyps in the transverse colon, in the                         ascending colon and  in the cecum, removed with a cold                         snare. Resected and retrieved.                        - Two 5 to 7 mm, non-bleeding polyps in the sigmoid                         colon and in the descending colon, removed with a cold                         snare and removed with a hot snare. Resected and                         retrieved.                        - Rule out malignancy, tumor at 15 cm proximal to the                         anus. Biopsied. Tattooed.                        - One 10 mm polyp in the sigmoid colon, removed with a                         hot snare. Resected and retrieved.                        - The examination was otherwise normal.                        - Diverticulosis in the sigmoid colon.                        - The distal rectum and anal verge are normal on                         retroflexion view. Recommendation:        - Discharge patient to home (with escort).                        - High fiber diet.                        - Advance diet as tolerated.                        - Continue present medications.                        - Await pathology results.                        -  Repeat colonoscopy in 1 year, with 2 day prep.                        - The findings and recommendations were discussed with                         the patient.                        - The findings and recommendations were discussed with                         the patient's family.                        - Return to primary care physician as previously                         scheduled.                        - Return to my office in 4 weeks. Procedure Code(s):     --- Professional ---                        [phone], Colonoscopy, flexible; with removal of                         tumor(s), polyp(s), or other lesion(s) by snare                         technique                        [id number], Colonoscopy, flexible; with directed submucosal                          injection(s), any substance                        [id number], 59, Colonoscopy, flexible; with biopsy, single                         or multiple Diagnosis Code(s):     --- Professional ---                        K63.5, Polyp of colon                        D49.0, Neoplasm of unspecified behavior of digestive                         system                        R19.5, Other fecal abnormalities                        K57.30, Diverticulosis of large intestine without                         perforation or abscess without bleeding CPT copyright 2019 American Medical Association.  All rights reserved. The codes documented in this report are preliminary and upon coder review may  be revised to meet current compliance requirements.  [name], MD Margretta Sidle B. Bonna Gains MD, MD 06/02/2019 9:58:17 AM This report has been signed electronically. Number of Addenda: 0 Note Initiated On: 06/02/2019 8:41 AM Scope Withdrawal Time: 0 hours 45 minutes 55 seconds  Total Procedure Duration: 0 hours 55 minutes 12 seconds  Estimated Blood Loss:  Estimated blood loss: none.      [facility]

## 2019-06-02 NOTE — Anesthesia Postprocedure Evaluation (Signed)
Anesthesia Post Note  Patient: KYLEE UTECHT  Procedure(s) Performed: COLONOSCOPY WITH PROPOFOL (N/A )  Patient location during evaluation: Endoscopy Anesthesia Type: General Level of consciousness: awake and alert Pain management: pain level controlled Vital Signs Assessment: post-procedure vital signs reviewed and stable Respiratory status: spontaneous breathing, nonlabored ventilation, respiratory function stable and patient connected to nasal cannula oxygen Cardiovascular status: blood pressure returned to baseline and stable Postop Assessment: no apparent nausea or vomiting Anesthetic complications: no     Last Vitals:  Vitals:   06/02/19 1013 06/02/19 1023  BP: (!) 133/94 115/84  Pulse: 63 (!) 55  Resp: 14 15  Temp:    SpO2: 100% 100%    Last Pain:  Vitals:   06/02/19 1023  TempSrc:   PainSc: 0-No pain                 Martha Clan

## 2019-06-02 NOTE — Transfer of Care (Signed)
Immediate Anesthesia Transfer of Care Note  Patient: Kirk Rodriguez  Procedure(s) Performed: COLONOSCOPY WITH PROPOFOL (N/A )  Patient Location: PACU  Anesthesia Type:General  Level of Consciousness: sedated  Airway & Oxygen Therapy: Patient Spontanous Breathing and Patient connected to nasal cannula oxygen  Post-op Assessment: Report given to RN and Post -op Vital signs reviewed and stable  Post vital signs: Reviewed and stable  Last Vitals:  Vitals Value Taken Time  BP 97/77 06/02/19 0953  Temp    Pulse 67 06/02/19 0953  Resp 16 06/02/19 0953  SpO2 96 % 06/02/19 0953  Vitals shown include unvalidated device data.  Last Pain:  Vitals:   06/02/19 0809  TempSrc: Tympanic         Complications: No apparent anesthesia complications

## 2019-06-02 NOTE — Anesthesia Procedure Notes (Signed)
Date/Time: 06/02/2019 8:44 AM Performed by: Johnna Acosta, CRNA Pre-anesthesia Checklist: Patient identified, Emergency Drugs available, Patient being monitored, Suction available and Timeout performed Patient Re-evaluated:Patient Re-evaluated prior to induction Oxygen Delivery Method: Nasal cannula Preoxygenation: Pre-oxygenation with 100% oxygen Induction Type: IV induction

## 2019-06-03 ENCOUNTER — Encounter: Payer: Self-pay | Admitting: *Deleted

## 2019-06-03 LAB — SURGICAL PATHOLOGY

## 2019-06-07 ENCOUNTER — Telehealth: Payer: Self-pay | Admitting: *Deleted

## 2019-06-07 NOTE — Telephone Encounter (Signed)
Patient called reporting that he was treated by Dr Baruch Gouty with radiation in past and that he recently had a colon mass found on colonoscopy and is asking if Dr Baruch Gouty will treat him for that or suggest someone who does. Please return his call [phone]

## 2019-06-07 NOTE — Telephone Encounter (Signed)
Returned call to patient, Dr. Baruch Gouty would like him referred to Medical oncology first and if radiation is needed he is happy to see the patient back.

## 2019-06-08 ENCOUNTER — Telehealth: Payer: Self-pay

## 2019-06-08 DIAGNOSIS — C189 Malignant neoplasm of colon, unspecified: Secondary | ICD-10-CM

## 2019-06-08 NOTE — Telephone Encounter (Signed)
Patient will be referred to Dr. Janese Banks for colon adenocarcinoma. Patient was notified that the cancer centyer will be reaching out to schedule him an appointment with Dr. Randa Evens per Dr. Bonna Gains.

## 2019-06-08 NOTE — Telephone Encounter (Signed)
Error

## 2019-06-08 NOTE — Telephone Encounter (Signed)
-----   Message from Virgel Manifold, MD sent at 06/08/2019 11:08 AM EDT ----- Please refer to Dr. Janese Banks for colon adenocarcinoma ----- Message ----- From: Noreene Filbert, MD Sent: 06/07/2019   3:42 PM EDT To: Virgel Manifold, MD  Would set him up with one of the oncologists first be happy to assist in any appointments that he needs. ----- Message ----- From: Virgel Manifold, MD Sent: 06/07/2019   1:59 PM EDT To: Noreene Filbert, MD  Dr. Baruch Gouty,  This patient has a colon mass that shows adenocarcinoma. Would he follow up with you in that regard or should I set him up with the cancer center at [facility]? Thank you

## 2019-06-13 ENCOUNTER — Inpatient Hospital Stay: Payer: Medicare PPO | Attending: Oncology | Admitting: Oncology

## 2019-06-13 ENCOUNTER — Other Ambulatory Visit: Payer: Self-pay

## 2019-06-13 ENCOUNTER — Inpatient Hospital Stay: Payer: Medicare PPO

## 2019-06-13 ENCOUNTER — Encounter (INDEPENDENT_AMBULATORY_CARE_PROVIDER_SITE_OTHER): Payer: Self-pay

## 2019-06-13 ENCOUNTER — Encounter: Payer: Self-pay | Admitting: Oncology

## 2019-06-13 VITALS — BP 110/79 | HR 66 | Temp 95.9°F | Resp 16 | Wt 195.2 lb

## 2019-06-13 DIAGNOSIS — Z801 Family history of malignant neoplasm of trachea, bronchus and lung: Secondary | ICD-10-CM | POA: Diagnosis not present

## 2019-06-13 DIAGNOSIS — Z79899 Other long term (current) drug therapy: Secondary | ICD-10-CM | POA: Diagnosis not present

## 2019-06-13 DIAGNOSIS — I10 Essential (primary) hypertension: Secondary | ICD-10-CM | POA: Insufficient documentation

## 2019-06-13 DIAGNOSIS — Z87891 Personal history of nicotine dependence: Secondary | ICD-10-CM | POA: Diagnosis not present

## 2019-06-13 DIAGNOSIS — Z7982 Long term (current) use of aspirin: Secondary | ICD-10-CM | POA: Insufficient documentation

## 2019-06-13 DIAGNOSIS — M47816 Spondylosis without myelopathy or radiculopathy, lumbar region: Secondary | ICD-10-CM | POA: Insufficient documentation

## 2019-06-13 DIAGNOSIS — C187 Malignant neoplasm of sigmoid colon: Secondary | ICD-10-CM

## 2019-06-13 DIAGNOSIS — Z86010 Personal history of colonic polyps: Secondary | ICD-10-CM | POA: Diagnosis not present

## 2019-06-13 DIAGNOSIS — J439 Emphysema, unspecified: Secondary | ICD-10-CM | POA: Insufficient documentation

## 2019-06-13 DIAGNOSIS — E785 Hyperlipidemia, unspecified: Secondary | ICD-10-CM | POA: Insufficient documentation

## 2019-06-13 DIAGNOSIS — J449 Chronic obstructive pulmonary disease, unspecified: Secondary | ICD-10-CM | POA: Diagnosis not present

## 2019-06-13 LAB — IRON AND TIBC
Iron: 117 ug/dL (ref 45–182)
Saturation Ratios: 31 % (ref 17.9–39.5)
TIBC: 381 ug/dL (ref 250–450)
UIBC: 264 ug/dL

## 2019-06-13 LAB — CBC WITH DIFFERENTIAL/PLATELET
Abs Immature Granulocytes: 0.03 10*3/uL (ref 0.00–0.07)
Basophils Absolute: 0.1 10*3/uL (ref 0.0–0.1)
Basophils Relative: 1 %
Eosinophils Absolute: 0.2 10*3/uL (ref 0.0–0.5)
Eosinophils Relative: 2 %
HCT: 40.7 % (ref 39.0–52.0)
Hemoglobin: 13.7 g/dL (ref 13.0–17.0)
Immature Granulocytes: 0 %
Lymphocytes Relative: 28 %
Lymphs Abs: 2.8 10*3/uL (ref 0.7–4.0)
MCH: 30 pg (ref 26.0–34.0)
MCHC: 33.7 g/dL (ref 30.0–36.0)
MCV: 89.1 fL (ref 80.0–100.0)
Monocytes Absolute: 0.9 10*3/uL (ref 0.1–1.0)
Monocytes Relative: 9 %
Neutro Abs: 6 10*3/uL (ref 1.7–7.7)
Neutrophils Relative %: 60 %
Platelets: 283 10*3/uL (ref 150–400)
RBC: 4.57 MIL/uL (ref 4.22–5.81)
RDW: 13.8 % (ref 11.5–15.5)
WBC: 10 10*3/uL (ref 4.0–10.5)
nRBC: 0 % (ref 0.0–0.2)

## 2019-06-13 LAB — COMPREHENSIVE METABOLIC PANEL
ALT: 20 U/L (ref 0–44)
AST: 15 U/L (ref 15–41)
Albumin: 4.3 g/dL (ref 3.5–5.0)
Alkaline Phosphatase: 56 U/L (ref 38–126)
Anion gap: 9 (ref 5–15)
BUN: 22 mg/dL (ref 8–23)
CO2: 25 mmol/L (ref 22–32)
Calcium: 9.2 mg/dL (ref 8.9–10.3)
Chloride: 104 mmol/L (ref 98–111)
Creatinine, Ser: 1.16 mg/dL (ref 0.61–1.24)
GFR calc Af Amer: >60 mL/min (ref >60)
GFR calc non Af Amer: >60 mL/min (ref >60)
Glucose, Bld: 105 mg/dL — ABNORMAL HIGH (ref 70–99)
Potassium: 3.9 mmol/L (ref 3.5–5.1)
Sodium: 138 mmol/L (ref 135–145)
Total Bilirubin: 0.7 mg/dL (ref 0.3–1.2)
Total Protein: 7.2 g/dL (ref 6.5–8.1)

## 2019-06-13 LAB — FERRITIN: Ferritin: 43 ng/mL (ref 24–336)

## 2019-06-13 NOTE — Progress Notes (Signed)
Hematology/Oncology Consult note [facility] Telephone:([phone] [phone]  Patient Care Team: Barbaraann Boys, MD as PCP - General (Family Medicine) Clent Jacks, RN as Oncology Nurse Navigator   Name of the patient: Kirk Rodriguez  [id number]  03-Jan-1946    Reason for referral-new diagnosis of colon cancer   Referring physician-Dr. Bonna Gains  Date of visit: 06/13/19   History of presenting illness-patient is a 74 year old male who was seen by Dr. Bonna Gains from GI for positive fit test. He underwent a colonoscopy on 06/02/2019 which showed 6 sessile polyps in the transverse colon ascending colon and cecum.  2 sessile semipedunculated nonbleeding polyps in the sigmoid colon and descending colon.  An ulcerating nonobstructing large mass 15 cm proximal to the anus.  Mass was noncircumferential.  No bleeding was present. Right and left colon polyps pathology was consistent with a tubular adenoma negative for high-grade dysplasia and malignancy.  Colorectal mass positive for invasive colon adenocarcinoma.  Sigmoid colon polyp tubulovillous adenoma.  Negative for high-grade dysplasia.  Patient is currently doing well for his age and is independent of his ADLs and IADLs.  Denies any problems with his bowel movements.  Denies any blood in his stools.  Appetite and weight have been stable  ECOG PS- 1  Pain scale- 0   Review of systems- Review of Systems  Constitutional: Negative for chills, fever, malaise/fatigue and weight loss.  HENT: Negative for congestion, ear discharge and nosebleeds.   Eyes: Negative for blurred vision.  Respiratory: Negative for cough, hemoptysis, sputum production, shortness of breath and wheezing.   Cardiovascular: Negative for chest pain, palpitations, orthopnea and claudication.  Gastrointestinal: Negative for abdominal pain, blood in stool, constipation, diarrhea, heartburn, melena, nausea and vomiting.  Genitourinary:  Negative for dysuria, flank pain, frequency, hematuria and urgency.  Musculoskeletal: Negative for back pain, joint pain and myalgias.  Skin: Negative for rash.  Neurological: Negative for dizziness, tingling, focal weakness, seizures, weakness and headaches.  Endo/Heme/Allergies: Does not bruise/bleed easily.  Psychiatric/Behavioral: Negative for depression and suicidal ideas. The patient does not have insomnia.     No Known Allergies  Patient Active Problem List   Diagnosis Date Noted  . Positive FIT (fecal immunochemical test)   . Polyp of colon   . Colon neoplasm   . Carcinoma in situ of larynx 11/18/2018  . Former cigarette smoker 05/01/2016  . COPD (chronic obstructive pulmonary disease) ([place]) 12/04/2015  . History of chickenpox 12/04/2015  . Hyperlipidemia, unspecified 12/04/2015  . Hypertension 12/04/2015  . Non-recurrent bilateral inguinal hernia without obstruction or gangrene 12/04/2015  . Current smoker 09/30/2013  . Trigger finger 06/09/2013     Past Medical History:  Diagnosis Date  . Allergy   . Cancer ([place])    Larynx--carcinoma in situ  . Hyperlipidemia   . Wears dentures    full upper, partial lower     Past Surgical History:  Procedure Laterality Date  . COLONOSCOPY WITH PROPOFOL N/A 06/02/2019   Procedure: COLONOSCOPY WITH PROPOFOL;  Surgeon: Virgel Manifold, MD;  Location: [facility] ENDOSCOPY;  Service: Endoscopy;  Laterality: N/A;  . DIRECT LARYNGOSCOPY Right 04/03/2016   Procedure: DIRECT MICROLARYNGOSCOPY WITH EXCISON RIGHT VOCAL CORD LESION;  Surgeon: Margaretha Sheffield, MD;  Location: [place];  Service: ENT;  Laterality: Right;  RIGHT   . INGUINAL HERNIA REPAIR Bilateral 12/18/2015   Procedure: LAPAROSCOPIC BILATERAL INGUINAL HERNIA REPAIR;  Surgeon: Hubbard Robinson, MD;  Location: [facility] ORS;  Service: General;  Laterality: Bilateral;  .  MINOR EXCISION OF ORAL LESION Left 03/06/2016   Procedure: direct microlaryngoscopy with excision left  vocal cord lesion;  Surgeon: Margaretha Sheffield, MD;  Location: [place];  Service: ENT;  Laterality: Left;  left vocal cord lesion  . TRIGGER FINGER RELEASE Left 2016  . VASECTOMY Bilateral 1978    Social History   Socioeconomic History  . Marital status: Married    Spouse name: Not on file  . Number of children: Not on file  . Years of education: Not on file  . Highest education level: Not on file  Occupational History  . Not on file  Tobacco Use  . Smoking status: Former Smoker    Packs/day: 1.50    Years: 53.00    Pack years: 79.50    Types: Cigarettes    Quit date: 04/18/2015    Years since quitting: 4.1  . Smokeless tobacco: Never Used  Substance and Sexual Activity  . Alcohol use: Not Currently  . Drug use: No  . Sexual activity: Not on file  Other Topics Concern  . Not on file  Social History Narrative  . Not on file   Social Determinants of Health   Financial Resource Strain:   . Difficulty of Paying Living Expenses:   Food Insecurity:   . Worried About Charity fundraiser in the Last Year:   . Arboriculturist in the Last Year:   Transportation Needs:   . Film/video editor (Medical):   Marland Kitchen Lack of Transportation (Non-Medical):   Physical Activity:   . Days of Exercise per Week:   . Minutes of Exercise per Session:   Stress:   . Feeling of Stress :   Social Connections:   . Frequency of Communication with Friends and Family:   . Frequency of Social Gatherings with Friends and Family:   . Attends Religious Services:   . Active Member of Clubs or Organizations:   . Attends Archivist Meetings:   Marland Kitchen Marital Status:   Intimate Partner Violence:   . Fear of Current or Ex-Partner:   . Emotionally Abused:   Marland Kitchen Physically Abused:   . Sexually Abused:      Family History  Problem Relation Age of Onset  . Lung cancer Father   . Heart disease Father   . Hypertension Father   . Polycythemia Mother   . Heart disease Mother      Current  Outpatient Medications:  .  aspirin EC 81 MG tablet, Take 81 mg by mouth daily. , Disp: , Rfl:  .  Cholecalciferol (VITAMIN D3) 2000 units capsule, Take 2,000 Units by mouth daily. , Disp: , Rfl:  .  Multiple Vitamin (MULTI-VITAMINS) TABS, Take 1 tablet by mouth daily. , Disp: , Rfl:  .  neomycin-bacitracin-polymyxin (NEOSPORIN) ointment, Apply 1 application topically every 12 (twelve) hours. apply to eye, Disp: 15 g, Rfl: 0 .  simvastatin (ZOCOR) 80 MG tablet, Take 80 mg by mouth daily at 6 PM. , Disp: , Rfl:  .  triamcinolone (NASACORT AQ) 55 MCG/ACT AERO nasal inhaler, Place 2 sprays into the nose daily., Disp: , Rfl:    Physical exam:  Vitals:   06/13/19 1105  BP: 110/79  Pulse: 66  Resp: 16  Temp: (!) 95.9 F (35.5 C)  TempSrc: Tympanic  SpO2: 99%  Weight: 195 lb 3.2 oz (88.5 kg)   Physical Exam Constitutional:      General: He is not in acute distress. Cardiovascular:  Rate and Rhythm: Normal rate and regular rhythm.     Heart sounds: Normal heart sounds.  Pulmonary:     Effort: Pulmonary effort is normal.     Breath sounds: Normal breath sounds.  Abdominal:     General: Bowel sounds are normal.     Palpations: Abdomen is soft.  Skin:    General: Skin is warm and dry.  Neurological:     Mental Status: He is alert and oriented to person, place, and time.        CMP Latest Ref Rng & Units 04/29/2016  Creatinine 0.61 - 1.24 mg/dL 1.10   CBC Latest Ref Rng & Units 06/17/2016  WBC 3.8 - 10.6 K/uL 9.9  Hemoglobin 13.0 - 18.0 g/dL 13.7  Hematocrit 40.0 - 52.0 % 39.7(L)  Platelets 150 - 440 K/uL 282     Assessment and plan- Patient is a 74 y.o. male referred for new diagnosis of colon cancer  I discussed with the patient the results of pathology in detail.  I will obtain a baseline CBC, CMP ferritin and iron studies and CEA today.  Patient will also need CT chest abdomen and pelvis with contrast for complete staging work-up.  If there is no evidence of  metastatic disease he will need resection of the primary colon mass.  Patient has good performance status to undergo surgery.  Mass appears to be in the sigmoid colon and does not appear to be a primary rectal mass.  However I will defer this decision whether this is a colon versus rectal cancer to surgery based on their assessment.  If this turns out to be a rectal cancer, patient would need MRI pelvis to ascertain the extent of the disease and to determine if he would benefit from neoadjuvant treatment.  However if this is colon cancer he can undergo upfront surgery.  Final staging will be determined after surgery.  Discussed with patient that stage I colon cancer would not require adjuvant chemotherapy.  Some cases of stage II may benefit from adjuvant therapy and chemotherapy would be warranted for stage III colon cancer.  Patient verbalized understanding.  I will see him after CT scan results are back   Thank you for this kind referral and the opportunity to participate in the care of this  Patient   Visit Diagnosis 1. Malignant neoplasm of sigmoid colon ([place])     Dr. Randa Evens, MD, MPH [facility] [phone] 06/13/2019

## 2019-06-13 NOTE — Progress Notes (Signed)
Patient here for initial oncology appointment, patient states he has had cancer with radiation in the past. Patient expresses complaints of  pain occasionally, otherwise no other issues.

## 2019-06-14 ENCOUNTER — Ambulatory Visit: Payer: Medicare PPO | Admitting: Surgery

## 2019-06-14 ENCOUNTER — Encounter: Payer: Self-pay | Admitting: Surgery

## 2019-06-14 VITALS — BP 151/81 | HR 98 | Temp 97.5°F | Ht 73.0 in | Wt 196.0 lb

## 2019-06-14 DIAGNOSIS — C19 Malignant neoplasm of rectosigmoid junction: Secondary | ICD-10-CM | POA: Diagnosis not present

## 2019-06-14 LAB — CEA: CEA: 2 ng/mL (ref 0.0–4.7)

## 2019-06-14 NOTE — Progress Notes (Signed)
Patient ID: Kirk Rodriguez, male   DOB: 1945-03-04, 74 y.o.   MRN: [id number]  Chief Complaint: Colorectal carcinoma  History of Present Illness Kirk Rodriguez is a 74 y.o. male with a diagnosis of colorectal carcinoma, with mass identified at 15 cm from the anal verge.  CT scan pending for staging.  Patient had a history of rectal bleeding and underwent recent colonoscopy with the findings of multiple other polyps, all benign with the exception of a noncircumferential mass at 15 cm from anal verge.  He has no history of weight loss, is quite active physically performing various activities maintaining his [place].  Past Medical History Past Medical History:  Diagnosis Date  . Allergy   . Cancer ([place])    Larynx--carcinoma in situ  . Hyperlipidemia   . Wears dentures    full upper, partial lower      Past Surgical History:  Procedure Laterality Date  . COLONOSCOPY WITH PROPOFOL N/A 06/02/2019   Procedure: COLONOSCOPY WITH PROPOFOL;  Surgeon: Virgel Manifold, MD;  Location: [facility];  Service: Endoscopy;  Laterality: N/A;  . DIRECT LARYNGOSCOPY Right 04/03/2016   Procedure: DIRECT MICROLARYNGOSCOPY WITH EXCISON RIGHT VOCAL CORD LESION;  Surgeon: Margaretha Sheffield, MD;  Location: [place];  Service: ENT;  Laterality: Right;  RIGHT   . INGUINAL HERNIA REPAIR Bilateral 12/18/2015   Procedure: LAPAROSCOPIC BILATERAL INGUINAL HERNIA REPAIR;  Surgeon: Hubbard Robinson, MD;  Location: [facility];  Service: General;  Laterality: Bilateral;  . MINOR EXCISION OF ORAL LESION Left 03/06/2016   Procedure: direct microlaryngoscopy with excision left vocal cord lesion;  Surgeon: Margaretha Sheffield, MD;  Location: [place];  Service: ENT;  Laterality: Left;  left vocal cord lesion  . TRIGGER FINGER RELEASE Left 2016  . VASECTOMY Bilateral 1978    No Known Allergies  Current Outpatient Medications  Medication Sig Dispense Refill  . acetaminophen (TYLENOL) 500 MG tablet Take 500  mg by mouth every 6 (six) hours as needed.    Marland Kitchen aspirin EC 81 MG tablet Take 81 mg by mouth daily.     . Cholecalciferol (VITAMIN D3) 2000 units capsule Take 2,000 Units by mouth daily.     . Multiple Vitamin (MULTI-VITAMINS) TABS Take 1 tablet by mouth daily.     . naproxen sodium (ALEVE) 220 MG tablet Take 220 mg by mouth.    . simvastatin (ZOCOR) 80 MG tablet Take 80 mg by mouth daily at 6 PM.     . triamcinolone (NASACORT AQ) 55 MCG/ACT AERO nasal inhaler Place 2 sprays into the nose daily.     No current facility-administered medications for this visit.    Family History Family History  Problem Relation Age of Onset  . Lung cancer Father   . Heart disease Father   . Hypertension Father   . Polycythemia Mother   . Heart disease Mother       Social History Social History   Tobacco Use  . Smoking status: Former Smoker    Packs/day: 1.50    Years: 53.00    Pack years: 79.50    Types: Cigarettes    Quit date: 04/18/2015    Years since quitting: 4.1  . Smokeless tobacco: Never Used  Substance Use Topics  . Alcohol use: Not Currently  . Drug use: No        Review of Systems  Constitutional: Negative.   HENT: Negative.   Eyes: Negative.   Respiratory: Negative.   Cardiovascular: Negative.  Gastrointestinal: Positive for blood in stool.  Genitourinary: Negative.   Musculoskeletal: Negative.   Skin: Negative.   Neurological: Negative.   Endo/Heme/Allergies: Negative.       Physical Exam Blood pressure (!) 151/81, pulse 98, temperature (!) 97.5 F (36.4 C), height 6\' 1"  (1.854 m), weight 196 lb (88.9 kg), SpO2 97 %. Last Weight  Most recent update: 06/14/2019  3:10 PM   Weight  88.9 kg (196 lb)            CONSTITUTIONAL: Well developed, and nourished, appropriately responsive and aware without distress.   EYES: Sclera non-icteric.   EARS, NOSE, MOUTH AND THROAT: Mask worn.    Hearing is intact to voice.  NECK: Trachea is midline, and there is no jugular  venous distension.  LYMPH NODES:  Lymph nodes in the neck are not enlarged. RESPIRATORY:  Lungs are clear, and breath sounds are equal bilaterally. Normal respiratory effort without pathologic use of accessory muscles. CARDIOVASCULAR: Heart is regular in rate and rhythm. GI: The abdomen is soft, nontender, and nondistended. There were no palpable masses. I did not appreciate hepatosplenomegaly. There were normal bowel sounds. GU: Digital rectal exam reveals a smooth symmetrical prostate without nodularity, large rectal vault without any palpable rectal abnormality.  No gross blood noted. MUSCULOSKELETAL:  Symmetrical muscle tone appreciated in all four extremities.    SKIN: Skin turgor is normal. No pathologic skin lesions appreciated.  NEUROLOGIC:  Motor and sensation appear grossly normal.  Cranial nerves are grossly without defect. PSYCH:  Alert and oriented to person, place and time. Affect is appropriate for situation.  Data Reviewed I have personally reviewed what is currently available of the patient's imaging, recent labs and medical records.   Labs:  CBC Latest Ref Rng & Units 06/13/2019 06/17/2016 06/10/2016  WBC 4.0 - 10.5 K/uL 10.0 9.9 9.4  Hemoglobin 13.0 - 17.0 g/dL 13.7 13.7 13.2  Hematocrit 39.0 - 52.0 % 40.7 39.7(L) 38.4(L)  Platelets 150 - 400 K/uL 283 282 278   CMP Latest Ref Rng & Units 06/13/2019 04/29/2016  Glucose 70 - 99 mg/dL 105(H) -  BUN 8 - 23 mg/dL 22 -  Creatinine 0.61 - 1.24 mg/dL 1.16 1.10  Sodium 135 - 145 mmol/L 138 -  Potassium 3.5 - 5.1 mmol/L 3.9 -  Chloride 98 - 111 mmol/L 104 -  CO2 22 - 32 mmol/L 25 -  Calcium 8.9 - 10.3 mg/dL 9.2 -  Total Protein 6.5 - 8.1 g/dL 7.2 -  Total Bilirubin 0.3 - 1.2 mg/dL 0.7 -  Alkaline Phos 38 - 126 U/L 56 -  AST 15 - 41 U/L 15 -  ALT 0 - 44 U/L 20 -      Imaging:  Within last 24 hrs: No results found.  Assessment    Colorectal carcinoma at 15 cm from anal verge, staging in process. Patient Active Problem List    Diagnosis Date Noted  . Positive FIT (fecal immunochemical test)   . Polyp of colon   . Colon neoplasm   . Carcinoma in situ of larynx 11/18/2018  . Former cigarette smoker 05/01/2016  . COPD (chronic obstructive pulmonary disease) ([place]) 12/04/2015  . History of chickenpox 12/04/2015  . Hyperlipidemia, unspecified 12/04/2015  . Hypertension 12/04/2015  . Non-recurrent bilateral inguinal hernia without obstruction or gangrene 12/04/2015  . Current smoker 09/30/2013  . Trigger finger 06/09/2013    Plan    Patient is currently scheduled for CT scan for staging, he will also see Dr. Janese Banks  next week, I can see him later the same day and create/formulate a treatment plan at that point. We did briefly discuss robotic low anterior resection.  We also discussed the possibility if this is deemed a rectal lesion that preoperative chemoradiation would be of benefit.  Face-to-face time spent with the patient and accompanying care providers(if present) was 35 minutes, with more than 50% of the time spent counseling, educating, and coordinating care of the patient.      Ronny Bacon M.D., FACS 06/14/2019, 5:09 PM

## 2019-06-14 NOTE — Patient Instructions (Addendum)
We have discussed plans for robotic colon resection. This may change based on your CT results.  We will have you return Tuesday June 15th for follow up and to discuss the next steps.

## 2019-06-17 ENCOUNTER — Ambulatory Visit
Admission: RE | Admit: 2019-06-17 | Discharge: 2019-06-17 | Disposition: A | Discharge status: Home / Self Care | Payer: Medicare PPO | Source: Ambulatory Visit | Attending: Oncology | Admitting: Oncology

## 2019-06-17 ENCOUNTER — Other Ambulatory Visit: Payer: Self-pay

## 2019-06-17 DIAGNOSIS — C187 Malignant neoplasm of sigmoid colon: Secondary | ICD-10-CM | POA: Diagnosis present

## 2019-06-17 MED ORDER — IOHEXOL 300 MG/ML  SOLN
100.0000 mL | Freq: Once | INTRAMUSCULAR | Status: AC | PRN
  Administered 2019-06-17: 100 mL via INTRAVENOUS

## 2019-06-21 ENCOUNTER — Encounter: Payer: Self-pay | Admitting: Oncology

## 2019-06-21 ENCOUNTER — Other Ambulatory Visit: Payer: Self-pay

## 2019-06-21 ENCOUNTER — Ambulatory Visit (INDEPENDENT_AMBULATORY_CARE_PROVIDER_SITE_OTHER): Payer: Medicare PPO | Admitting: Surgery

## 2019-06-21 ENCOUNTER — Ambulatory Visit: Payer: Self-pay | Admitting: Surgery

## 2019-06-21 ENCOUNTER — Telehealth: Payer: Self-pay | Admitting: Surgery

## 2019-06-21 ENCOUNTER — Encounter: Payer: Self-pay | Admitting: Surgery

## 2019-06-21 ENCOUNTER — Other Ambulatory Visit: Payer: Self-pay | Admitting: Radiology

## 2019-06-21 ENCOUNTER — Inpatient Hospital Stay: Payer: Medicare PPO | Admitting: Oncology

## 2019-06-21 VITALS — BP 116/84 | Temp 96.5°F | Wt 195.6 lb

## 2019-06-21 VITALS — BP 125/82 | HR 65 | Temp 97.7°F | Ht 73.0 in | Wt 195.0 lb

## 2019-06-21 DIAGNOSIS — C187 Malignant neoplasm of sigmoid colon: Secondary | ICD-10-CM | POA: Diagnosis not present

## 2019-06-21 DIAGNOSIS — C19 Malignant neoplasm of rectosigmoid junction: Secondary | ICD-10-CM

## 2019-06-21 DIAGNOSIS — D490 Neoplasm of unspecified behavior of digestive system: Secondary | ICD-10-CM | POA: Diagnosis not present

## 2019-06-21 MED ORDER — POLYETHYLENE GLYCOL 3350 17 GM/SCOOP PO POWD
ORAL | 0 refills | Status: DC
Start: 2019-06-21 — End: 2019-07-01

## 2019-06-21 MED ORDER — ERYTHROMYCIN BASE 500 MG PO TABS: ORAL_TABLET | ORAL | 0 refills | Status: DC

## 2019-06-21 MED ORDER — NEOMYCIN SULFATE 500 MG PO TABS
ORAL_TABLET | ORAL | 0 refills | Status: DC
Start: 2019-06-21 — End: 2019-07-01

## 2019-06-21 MED ORDER — BISACODYL 5 MG PO TBEC
DELAYED_RELEASE_TABLET | ORAL | 0 refills | Status: DC
Start: 2019-06-21 — End: 2019-07-01

## 2019-06-21 NOTE — H&P (View-Only) (Signed)
Patient ID: Kirk Rodriguez, male   DOB: [birth year], 74 y.o.   MRN: [id number]  Chief Complaint: Colorectal carcinoma  History of Present Illness Kirk Rodriguez is a 74 y.o. male with a diagnosis of colorectal carcinoma, with mass identified at 15 cm from the anal verge.  Patient had a history of rectal bleeding and underwent recent colonoscopy with the findings of multiple other polyps, all benign with the exception of a noncircumferential mass at 15 cm from anal verge.  He has no history of weight loss, is quite active physically performing various activities maintaining his [place].   Past Medical History Past Medical History:  Diagnosis Date  . Allergy   . Cancer ([place])    Larynx--carcinoma in situ  . Hyperlipidemia   . Wears dentures    full upper, partial lower      Past Surgical History:  Procedure Laterality Date  . COLONOSCOPY WITH PROPOFOL N/A 06/02/2019   Procedure: COLONOSCOPY WITH PROPOFOL;  Surgeon: Virgel Manifold, MD;  Location: [facility];  Service: Endoscopy;  Laterality: N/A;  . DIRECT LARYNGOSCOPY Right 04/03/2016   Procedure: DIRECT MICROLARYNGOSCOPY WITH EXCISON RIGHT VOCAL CORD LESION;  Surgeon: Margaretha Sheffield, MD;  Location: [place];  Service: ENT;  Laterality: Right;  RIGHT   . INGUINAL HERNIA REPAIR Bilateral 12/18/2015   Procedure: LAPAROSCOPIC BILATERAL INGUINAL HERNIA REPAIR;  Surgeon: Hubbard Robinson, MD;  Location: [facility];  Service: General;  Laterality: Bilateral;  . MINOR EXCISION OF ORAL LESION Left 03/06/2016   Procedure: direct microlaryngoscopy with excision left vocal cord lesion;  Surgeon: Margaretha Sheffield, MD;  Location: [place];  Service: ENT;  Laterality: Left;  left vocal cord lesion  . TRIGGER FINGER RELEASE Left 2016  . VASECTOMY Bilateral 1978    No Known Allergies  Current Outpatient Medications  Medication Sig Dispense Refill  . acetaminophen (TYLENOL) 500 MG tablet Take 500 mg by mouth every 6 (six)  hours as needed (pain.).     Marland Kitchen aspirin EC 81 MG tablet Take 81 mg by mouth daily.     . Cholecalciferol (VITAMIN D3) 2000 units capsule Take 2,000 Units by mouth daily.     . simvastatin (ZOCOR) 80 MG tablet Take 80 mg by mouth at bedtime.     . triamcinolone (NASACORT AQ) 55 MCG/ACT AERO nasal inhaler Place 1 spray into the nose daily.     . beta carotene w/minerals (OCUVITE) tablet Take 1 tablet by mouth at bedtime.    . bisacodyl (DULCOLAX) 5 MG EC tablet Take all 4 tablets at 8 am the morning prior to your surgery. 4 tablet 0  . erythromycin base (E-MYCIN) 500 MG tablet Take 2 tablets at 8am, 2 tablets at 2pm, and 2 tablets at 8pm the day prior to surgery. 6 tablet 0  . Liniments (BLUE-EMU SUPER STRENGTH EX) Apply 1 application topically 2 (two) times daily as needed (applied to right hand as needed for soreness/pain.).    Marland Kitchen Misc Natural Products (OSTEO BI-FLEX TRIPLE STRENGTH PO) Take 1 tablet by mouth in the morning and at bedtime.    . Multiple Vitamin (MULTIVITAMIN WITH MINERALS) TABS tablet Take 1 tablet by mouth daily.    . Naproxen Sod-diphenhydrAMINE (ALEVE PM) 220-25 MG TABS Take 0.5 mg by mouth at bedtime as needed (sleep.).    Marland Kitchen neomycin (MYCIFRADIN) 500 MG tablet Take 2 tablet at 8am, take 2 tablets at 2pm, and take 2 tablets at 8pm the day prior to your surgery 6 tablet  0  . polyethylene glycol powder (MIRALAX) 17 GM/SCOOP powder Mix full container in 64 ounces of Gatorade or other clear liquid. No Red 238 g 0   No current facility-administered medications for this visit.    Family History Family History  Problem Relation Age of Onset  . Lung cancer Father   . Heart disease Father   . Hypertension Father   . Polycythemia Mother   . Heart disease Mother       Social History Social History   Tobacco Use  . Smoking status: Former Smoker    Packs/day: 1.50    Years: 53.00    Pack years: 79.50    Types: Cigarettes    Quit date: 04/18/2015    Years since quitting: 4.1   . Smokeless tobacco: Never Used  Vaping Use  . Vaping Use: Never used  Substance Use Topics  . Alcohol use: Not Currently  . Drug use: No        Review of Systems  Constitutional: Negative.   HENT: Negative.   Eyes: Negative.   Respiratory: Negative.   Cardiovascular: Negative.   Gastrointestinal: Negative.   Genitourinary: Negative.   Musculoskeletal: Negative.   Skin: Negative.   Neurological: Negative.   Endo/Heme/Allergies: Negative.       Physical Exam Blood pressure 125/82, pulse 65, temperature 97.7 F (36.5 C), height 6\' 1"  (1.854 m), weight 195 lb (88.5 kg), SpO2 95 %. Last Weight  Most recent update: 06/21/2019 11:20 AM   Weight  88.5 kg (195 lb)            CONSTITUTIONAL: Well developed, and nourished, appropriately responsive and aware without distress.   EYES: Sclera non-icteric.   EARS, NOSE, MOUTH AND THROAT: Mask worn.    Hearing is intact to voice.  NECK: Trachea is midline, and there is no jugular venous distension.  LYMPH NODES:  Lymph nodes in the neck are not enlarged. RESPIRATORY:  Lungs are clear, and breath sounds are equal bilaterally. Normal respiratory effort without pathologic use of accessory muscles. CARDIOVASCULAR: Heart is regular in rate and rhythm. GI: The abdomen is soft, nontender, and nondistended. There were no palpable masses. I did not appreciate hepatosplenomegaly. There were normal bowel sounds. GU: Digital rectal exam reveals a smooth symmetrical prostate without nodularity, large rectal vault without any palpable rectal abnormality.  No gross blood noted. MUSCULOSKELETAL:  Symmetrical muscle tone appreciated in all four extremities.    SKIN: Skin turgor is normal. No pathologic skin lesions appreciated.  NEUROLOGIC:  Motor and sensation appear grossly normal.  Cranial nerves are grossly without defect. PSYCH:  Alert and oriented to person, place and time. Affect is appropriate for situation.  Data Reviewed I have  personally reviewed what is currently available of the patient's imaging, recent labs and medical records.   Labs:  CBC Latest Ref Rng & Units 06/13/2019 06/17/2016 06/10/2016  WBC 4.0 - 10.5 K/uL 10.0 9.9 9.4  Hemoglobin 13.0 - 17.0 g/dL 13.7 13.7 13.2  Hematocrit 39 - 52 % 40.7 39.7(L) 38.4(L)  Platelets 150 - 400 K/uL 283 282 278   CMP Latest Ref Rng & Units 06/13/2019 04/29/2016  Glucose 70 - 99 mg/dL 105(H) -  BUN 8 - 23 mg/dL 22 -  Creatinine 0.61 - 1.24 mg/dL 1.16 1.10  Sodium 135 - 145 mmol/L 138 -  Potassium 3.5 - 5.1 mmol/L 3.9 -  Chloride 98 - 111 mmol/L 104 -  CO2 22 - 32 mmol/L 25 -  Calcium 8.9 - 10.3  mg/dL 9.2 -  Total Protein 6.5 - 8.1 g/dL 7.2 -  Total Bilirubin 0.3 - 1.2 mg/dL 0.7 -  Alkaline Phos 38 - 126 U/L 56 -  AST 15 - 41 U/L 15 -  ALT 0 - 44 U/L 20 -      Imaging: CLINICAL DATA:  Newly diagnosed colon cancer. History of vocal cord cancer, status post radiation in 2018.  EXAM: CT CHEST, ABDOMEN, AND PELVIS WITH CONTRAST  TECHNIQUE: Multidetector CT imaging of the chest, abdomen and pelvis was performed following the standard protocol during bolus administration of intravenous contrast.  CONTRAST:  139mL OMNIPAQUE IOHEXOL 300 MG/ML  SOLN  COMPARISON:  Low-dose lung cancer screening CT chest dated 09/29/2018  FINDINGS: CT CHEST FINDINGS  Cardiovascular: Heart is normal in size.  No pericardial effusion.  No evidence of thoracic aortic aneurysm. Mild atherosclerotic calcifications of the aortic arch.  Mild coronary atherosclerosis of the LAD and right coronary artery.  Mediastinum/Nodes: Small mediastinal and right hilar nodes which do not meet pathologic CT size criteria.  Visualized thyroid is unremarkable.  Lungs/Pleura: Severe centrilobular and paraseptal emphysematous changes, upper lung predominant.  Mild biapical pleural-parenchymal scarring.  Scattered small bilateral pulmonary nodules (for example, series 4/images 29,  64, and 66), unchanged from priors, benign.  No focal consolidation.  No pleural effusion or pneumothorax.  Musculoskeletal: Mild degenerative changes of the mid thoracic spine.  CT ABDOMEN PELVIS FINDINGS  Hepatobiliary: Liver is within normal limits. No suspicious/enhancing hepatic lesions.  Gallbladder is unremarkable. No intrahepatic or extrahepatic ductal dilatation.  Pancreas: Within normal limits.  Spleen: Within normal limits.  Adrenals/Urinary Tract: Adrenal glands are within normal limits.  Kidneys are within normal limits.  No hydronephrosis.  Mildly thick-walled bladder, although underdistended.  Stomach/Bowel: Stomach is within normal limits.  No evidence of bowel obstruction.  Normal appendix (series 2/image 88).  Scattered left colonic diverticulosis, without evidence of diverticulitis.  Mild irregular wall thickening along the right lateral aspect of the upper rectum (series 2/image 13; coronal image 94), equivocal; correlate with recent colonoscopy.  Otherwise, no evidence of colonic mass on CT.  Vascular/Lymphatic: No evidence of abdominal aortic aneurysm.  Atherosclerotic calcifications of the abdominal aorta and branch vessels.  No suspicious abdominopelvic lymphadenopathy.  Reproductive: Prostate is grossly unremarkable.  Other: No abdominopelvic ascites.  Musculoskeletal: Mild degenerative changes of the lumbar spine.  IMPRESSION: Mild irregular wall thickening along the right lateral aspect of the upper rectum, equivocal. Correlate with recent colonoscopy.  No findings suspicious for metastatic disease in the chest, abdomen, or pelvis.  Small bilateral pulmonary nodules are unchanged from priors, benign.  Aortic Atherosclerosis (ICD10-I70.0) and Emphysema (ICD10-J43.9).   Electronically Signed   By: Julian Hy M.D.   On: 06/18/2019 02:09 Assessment    Colorectal carcinoma at 15 cm from  anal verge, negative for mets on CT exam. Patient Active Problem List   Diagnosis Date Noted  . Positive FIT (fecal immunochemical test)   . Polyp of colon   . Colon neoplasm   . Carcinoma in situ of larynx 11/18/2018  . Former cigarette smoker 05/01/2016  . COPD (chronic obstructive pulmonary disease) ([place]) 12/04/2015  . History of chickenpox 12/04/2015  . Hyperlipidemia, unspecified 12/04/2015  . Hypertension 12/04/2015  . Current smoker 09/30/2013  . Trigger finger 06/09/2013    Plan    Robotic low anterior resection, sigmoidectomy.  Risks and benefits of the above procedure been discussed in detail.  These include but are not limited to anesthesia, bleeding, injury  to ureter, anastomotic leak with risks of infection, genitourinary dysfunction, etc. I believe he and his wife understand the risks, have had their questions adequately answered, and desires to proceed.  No guarantees were ever expressed or implied. Face-to-face time spent with the patient and accompanying care providers(if present) was 35 minutes, with more than 50% of the time spent counseling, educating, and coordinating care of the patient.      Ronny Bacon M.D., FACS 06/21/2019, 2:08 PM

## 2019-06-21 NOTE — Progress Notes (Signed)
Patient ID: Kirk Rodriguez, male   DOB: [birth year], 74 y.o.   MRN: [id number]  Chief Complaint: Colorectal carcinoma  History of Present Illness Kirk Rodriguez is a 74 y.o. male with a diagnosis of colorectal carcinoma, with mass identified at 15 cm from the anal verge.  Patient had a history of rectal bleeding and underwent recent colonoscopy with the findings of multiple other polyps, all benign with the exception of a noncircumferential mass at 15 cm from anal verge.  He has no history of weight loss, is quite active physically performing various activities maintaining his [place].   Past Medical History Past Medical History:  Diagnosis Date  . Allergy   . Cancer ([place])    Larynx--carcinoma in situ  . Hyperlipidemia   . Wears dentures    full upper, partial lower      Past Surgical History:  Procedure Laterality Date  . COLONOSCOPY WITH PROPOFOL N/A 06/02/2019   Procedure: COLONOSCOPY WITH PROPOFOL;  Surgeon: Virgel Manifold, MD;  Location: [facility];  Service: Endoscopy;  Laterality: N/A;  . DIRECT LARYNGOSCOPY Right 04/03/2016   Procedure: DIRECT MICROLARYNGOSCOPY WITH EXCISON RIGHT VOCAL CORD LESION;  Surgeon: Margaretha Sheffield, MD;  Location: [facility];  Service: ENT;  Laterality: Right;  RIGHT   . INGUINAL HERNIA REPAIR Bilateral 12/18/2015   Procedure: LAPAROSCOPIC BILATERAL INGUINAL HERNIA REPAIR;  Surgeon: Hubbard Robinson, MD;  Location: [facility];  Service: General;  Laterality: Bilateral;  . MINOR EXCISION OF ORAL LESION Left 03/06/2016   Procedure: direct microlaryngoscopy with excision left vocal cord lesion;  Surgeon: Margaretha Sheffield, MD;  Location: [facility];  Service: ENT;  Laterality: Left;  left vocal cord lesion  . TRIGGER FINGER RELEASE Left 2016  . VASECTOMY Bilateral 1978    No Known Allergies  Current Outpatient Medications  Medication Sig Dispense Refill  . acetaminophen (TYLENOL) 500 MG tablet Take 500 mg by mouth every 6 (six)  hours as needed (pain.).     Marland Kitchen aspirin EC 81 MG tablet Take 81 mg by mouth daily.     . Cholecalciferol (VITAMIN D3) 2000 units capsule Take 2,000 Units by mouth daily.     . simvastatin (ZOCOR) 80 MG tablet Take 80 mg by mouth at bedtime.     . triamcinolone (NASACORT AQ) 55 MCG/ACT AERO nasal inhaler Place 1 spray into the nose daily.     . beta carotene w/minerals (OCUVITE) tablet Take 1 tablet by mouth at bedtime.    . bisacodyl (DULCOLAX) 5 MG EC tablet Take all 4 tablets at 8 am the morning prior to your surgery. 4 tablet 0  . erythromycin base (E-MYCIN) 500 MG tablet Take 2 tablets at 8am, 2 tablets at 2pm, and 2 tablets at 8pm the day prior to surgery. 6 tablet 0  . Liniments (BLUE-EMU SUPER STRENGTH EX) Apply 1 application topically 2 (two) times daily as needed (applied to right hand as needed for soreness/pain.).    Marland Kitchen Misc Natural Products (OSTEO BI-FLEX TRIPLE STRENGTH PO) Take 1 tablet by mouth in the morning and at bedtime.    . Multiple Vitamin (MULTIVITAMIN WITH MINERALS) TABS tablet Take 1 tablet by mouth daily.    . Naproxen Sod-diphenhydrAMINE (ALEVE PM) 220-25 MG TABS Take 0.5 mg by mouth at bedtime as needed (sleep.).    Marland Kitchen neomycin (MYCIFRADIN) 500 MG tablet Take 2 tablet at 8am, take 2 tablets at 2pm, and take 2 tablets at 8pm the day prior to your surgery 6 tablet  0  . polyethylene glycol powder (MIRALAX) 17 GM/SCOOP powder Mix full container in 64 ounces of Gatorade or other clear liquid. No Red 238 g 0   No current facility-administered medications for this visit.    Family History Family History  Problem Relation Age of Onset  . Lung cancer Father   . Heart disease Father   . Hypertension Father   . Polycythemia Mother   . Heart disease Mother       Social History Social History   Tobacco Use  . Smoking status: Former Smoker    Packs/day: 1.50    Years: 53.00    Pack years: 79.50    Types: Cigarettes    Quit date: 04/18/2015    Years since quitting: 4.1   . Smokeless tobacco: Never Used  Vaping Use  . Vaping Use: Never used  Substance Use Topics  . Alcohol use: Not Currently  . Drug use: No        Review of Systems  Constitutional: Negative.   HENT: Negative.   Eyes: Negative.   Respiratory: Negative.   Cardiovascular: Negative.   Gastrointestinal: Negative.   Genitourinary: Negative.   Musculoskeletal: Negative.   Skin: Negative.   Neurological: Negative.   Endo/Heme/Allergies: Negative.       Physical Exam Blood pressure 125/82, pulse 65, temperature 97.7 F (36.5 C), height 6\' 1"  (1.854 m), weight 195 lb (88.5 kg), SpO2 95 %. Last Weight  Most recent update: 06/21/2019 11:20 AM   Weight  88.5 kg (195 lb)            CONSTITUTIONAL: Well developed, and nourished, appropriately responsive and aware without distress.   EYES: Sclera non-icteric.   EARS, NOSE, MOUTH AND THROAT: Mask worn.    Hearing is intact to voice.  NECK: Trachea is midline, and there is no jugular venous distension.  LYMPH NODES:  Lymph nodes in the neck are not enlarged. RESPIRATORY:  Lungs are clear, and breath sounds are equal bilaterally. Normal respiratory effort without pathologic use of accessory muscles. CARDIOVASCULAR: Heart is regular in rate and rhythm. GI: The abdomen is soft, nontender, and nondistended. There were no palpable masses. I did not appreciate hepatosplenomegaly. There were normal bowel sounds. GU: Digital rectal exam reveals a smooth symmetrical prostate without nodularity, large rectal vault without any palpable rectal abnormality.  No gross blood noted. MUSCULOSKELETAL:  Symmetrical muscle tone appreciated in all four extremities.    SKIN: Skin turgor is normal. No pathologic skin lesions appreciated.  NEUROLOGIC:  Motor and sensation appear grossly normal.  Cranial nerves are grossly without defect. PSYCH:  Alert and oriented to person, place and time. Affect is appropriate for situation.  Data Reviewed I have  personally reviewed what is currently available of the patient's imaging, recent labs and medical records.   Labs:  CBC Latest Ref Rng & Units 06/13/2019 06/17/2016 06/10/2016  WBC 4.0 - 10.5 K/uL 10.0 9.9 9.4  Hemoglobin 13.0 - 17.0 g/dL 13.7 13.7 13.2  Hematocrit 39 - 52 % 40.7 39.7(L) 38.4(L)  Platelets 150 - 400 K/uL 283 282 278   CMP Latest Ref Rng & Units 06/13/2019 04/29/2016  Glucose 70 - 99 mg/dL 105(H) -  BUN 8 - 23 mg/dL 22 -  Creatinine 0.61 - 1.24 mg/dL 1.16 1.10  Sodium 135 - 145 mmol/L 138 -  Potassium 3.5 - 5.1 mmol/L 3.9 -  Chloride 98 - 111 mmol/L 104 -  CO2 22 - 32 mmol/L 25 -  Calcium 8.9 - 10.3  mg/dL 9.2 -  Total Protein 6.5 - 8.1 g/dL 7.2 -  Total Bilirubin 0.3 - 1.2 mg/dL 0.7 -  Alkaline Phos 38 - 126 U/L 56 -  AST 15 - 41 U/L 15 -  ALT 0 - 44 U/L 20 -      Imaging: CLINICAL DATA:  Newly diagnosed colon cancer. History of vocal cord cancer, status post radiation in 2018.  EXAM: CT CHEST, ABDOMEN, AND PELVIS WITH CONTRAST  TECHNIQUE: Multidetector CT imaging of the chest, abdomen and pelvis was performed following the standard protocol during bolus administration of intravenous contrast.  CONTRAST:  135mL OMNIPAQUE IOHEXOL 300 MG/ML  SOLN  COMPARISON:  Low-dose lung cancer screening CT chest dated 09/29/2018  FINDINGS: CT CHEST FINDINGS  Cardiovascular: Heart is normal in size.  No pericardial effusion.  No evidence of thoracic aortic aneurysm. Mild atherosclerotic calcifications of the aortic arch.  Mild coronary atherosclerosis of the LAD and right coronary artery.  Mediastinum/Nodes: Small mediastinal and right hilar nodes which do not meet pathologic CT size criteria.  Visualized thyroid is unremarkable.  Lungs/Pleura: Severe centrilobular and paraseptal emphysematous changes, upper lung predominant.  Mild biapical pleural-parenchymal scarring.  Scattered small bilateral pulmonary nodules (for example, series 4/images 29,  64, and 66), unchanged from priors, benign.  No focal consolidation.  No pleural effusion or pneumothorax.  Musculoskeletal: Mild degenerative changes of the mid thoracic spine.  CT ABDOMEN PELVIS FINDINGS  Hepatobiliary: Liver is within normal limits. No suspicious/enhancing hepatic lesions.  Gallbladder is unremarkable. No intrahepatic or extrahepatic ductal dilatation.  Pancreas: Within normal limits.  Spleen: Within normal limits.  Adrenals/Urinary Tract: Adrenal glands are within normal limits.  Kidneys are within normal limits.  No hydronephrosis.  Mildly thick-walled bladder, although underdistended.  Stomach/Bowel: Stomach is within normal limits.  No evidence of bowel obstruction.  Normal appendix (series 2/image 88).  Scattered left colonic diverticulosis, without evidence of diverticulitis.  Mild irregular wall thickening along the right lateral aspect of the upper rectum (series 2/image 13; coronal image 94), equivocal; correlate with recent colonoscopy.  Otherwise, no evidence of colonic mass on CT.  Vascular/Lymphatic: No evidence of abdominal aortic aneurysm.  Atherosclerotic calcifications of the abdominal aorta and branch vessels.  No suspicious abdominopelvic lymphadenopathy.  Reproductive: Prostate is grossly unremarkable.  Other: No abdominopelvic ascites.  Musculoskeletal: Mild degenerative changes of the lumbar spine.  IMPRESSION: Mild irregular wall thickening along the right lateral aspect of the upper rectum, equivocal. Correlate with recent colonoscopy.  No findings suspicious for metastatic disease in the chest, abdomen, or pelvis.  Small bilateral pulmonary nodules are unchanged from priors, benign.  Aortic Atherosclerosis (ICD10-I70.0) and Emphysema (ICD10-J43.9).   Electronically Signed   By: Julian Hy M.D.   On: 06/18/2019 02:09 Assessment    Colorectal carcinoma at 15 cm from  anal verge, negative for mets on CT exam. Patient Active Problem List   Diagnosis Date Noted  . Positive FIT (fecal immunochemical test)   . Polyp of colon   . Colon neoplasm   . Carcinoma in situ of larynx 11/18/2018  . Former cigarette smoker 05/01/2016  . COPD (chronic obstructive pulmonary disease) ([place]) 12/04/2015  . History of chickenpox 12/04/2015  . Hyperlipidemia, unspecified 12/04/2015  . Hypertension 12/04/2015  . Current smoker 09/30/2013  . Trigger finger 06/09/2013    Plan    Robotic low anterior resection, sigmoidectomy.  Risks and benefits of the above procedure been discussed in detail.  These include but are not limited to anesthesia, bleeding, injury  to ureter, anastomotic leak with risks of infection, genitourinary dysfunction, etc. I believe he and his wife understand the risks, have had their questions adequately answered, and desires to proceed.  No guarantees were ever expressed or implied. Face-to-face time spent with the patient and accompanying care providers(if present) was 35 minutes, with more than 50% of the time spent counseling, educating, and coordinating care of the patient.      Ronny Bacon M.D., FACS 06/21/2019, 2:08 PM

## 2019-06-21 NOTE — Patient Instructions (Signed)
We have discussed removing a portion of your damaged colon through 4 small incisions today. We have scheduled this surgery at [facility] with Dr.Rodenberg. Please plan a hospital stay of 3-7 days for surgery and recovery time.  We will have you complete a bowel prep prior to your surgery. Please see information provided. You have also been given a (Blue) Pre-Care Sheet with more information regarding your particular surgery. Our surgery scheduler will be in contact with you to look at surgery dates and go over information as well as Covid 19 testing.   Please call our office with any questions or concerns prior to your scheduled surgery.   Laparoscopic Colectomy Laparoscopic colectomy is surgery to remove part or all of the large intestine (colon). This procedure may be used to treat several conditions, including:  Inflammation and infection of the colon (diverticulitis).  Tumors or masses in the colon.  Inflammatory bowel disease, such as Crohn disease or ulcerative colitis. Colectomy is an option when symptoms cannot be controlled with medicines.  Bleeding from the colon that cannot be controlled by another method.  Blockage or obstruction of the colon.  Tell a health care provider about:  Any allergies you have.  All medicines you are taking, including vitamins, herbs, eye drops, creams, and over-the-counter medicines.  Any problems you or family members have had with anesthetic medicines.  Any blood disorders you have.  Any surgeries you have had.  Any medical conditions you have. What are the risks? Generally, this is a safe procedure. However, problems may occur, including:  Infection.  Bleeding.  Allergic reactions to medicines or dyes.  Damage to other structures or organs.  Leaking from where the colon was sewn together.  Future blockage of the small intestines from scar tissue. Another surgery may be needed to repair this.  Needing to convert to an open procedure.  Complications such as damage to other organs or excessive bleeding may require the surgeon to convert from a laparoscopic procedure to an open procedure. This involves making a larger incision in the abdomen.  What happens before the procedure? Staying hydrated Follow instructions from your health care provider about hydration, which may include:  Up to 2 hours before the procedure - you may continue to drink clear liquids, such as water, clear fruit juice, black coffee, and plain tea.  Eating and drinking restrictions Follow instructions from your health care provider about eating and drinking, which may include:  8 hours before the procedure - stop eating heavy meals, meals with high fiber, or foods such as meat, fried foods, or fatty foods.  6 hours before the procedure - stop eating light meals or foods, such as toast or cereal.  6 hours before the procedure - stop drinking milk or drinks that contain milk.  2 hours before the procedure - stop drinking clear liquids.  Medicines  Ask your health care provider about: ? Changing or stopping your regular medicines. This is especially important if you are taking diabetes medicines or blood thinners. ? Taking medicines such as aspirin and ibuprofen. These medicines can thin your blood. Do not take these medicines before your procedure if your health care provider instructs you not to.  You may be given antibiotic medicine to clean out bacteria from your colon. Follow the directions carefully and take the medicine at the correct time. General instructions  You may be prescribed an oral bowel prep to clean out your colon in preparation for the surgery: ? Follow instructions from your  health care provider about how to do this. ? Do not eat or drink anything else after you have started the bowel prep, unless your health care provider tells you it is safe to do so.  Do not use any products that contain nicotine or tobacco, such as cigarettes  and e-cigarettes. If you need help quitting, ask your health care provider. What happens during the procedure?  To reduce your risk of infection: ? Your health care team will wash or sanitize their hands. ? Your skin will be washed with soap.  An IV tube will be inserted into one of your veins to deliver fluid and medication.  You will be given one of the following: ? A medicine to help you relax (sedative). ? A medicine to make you fall asleep (general anesthetic).  Small monitors will be connected to your body. They will be used to check your heart, blood pressure, and oxygen level.  A breathing tube may be placed into your lungs during the procedure.  A thin, flexible tube (catheter) will be placed into your bladder to drain urine.  A tube may be placed through your nose and into your stomach to drain stomach fluids (nasogastric tube, or NG tube).  Your abdomen will be filled with air so it expands. This gives the surgeon more room to operate and makes your organs easier to see.  Several small cuts (incisions) will be made in your abdomen.  A thin, lighted tube with a tiny camera on the end (laparoscope) will be put through one of the small incisions. The camera on the laparoscope will send a picture to a computer screen in the operating room. This will give the surgeon a good view inside your abdomen.  Hollow tubes will be put through the other small incisions in your abdomen. The tools that are needed for the procedure will be put through these tubes.  Clamps or staples will be put on both ends of the diseased part of the colon.  The part of the intestine between the clamps or staples will be removed.  If possible, the ends of the healthy colon that remain will be stitched (sutured) or stapled together to allow your body to pass waste (stool).  Sometimes, the remaining colon cannot be stitched back together. If this is the case, a colostomy will be needed. If you need a  colostomy: ? An opening to the outside of your body (stoma) will be made through your abdomen. ? The end of your colon will be brought to the opening. It will be stitched to the skin. ? A bag will be attached to the opening. Stool will drain into this removable bag. ? The colostomy may be temporary or permanent.  The incisions from the colectomy will be closed with sutures or staples. The procedure may vary among health care providers and hospitals. What happens after the procedure?  Your blood pressure, heart rate, breathing rate, and blood oxygen level will be monitored until the medicines you were given have worn off.  You will receive fluids through an IV tube until your bowels start to work properly.  Once your bowels are working again, you will be given clear liquids first and then solid food as tolerated.  You will be given medicines to control your pain and nausea, if needed.  Do not drive for 24 hours if you were given a sedative. This information is not intended to replace advice given to you by your health care provider. Make sure  you discuss any questions you have with your health care provider. Document Released: 03/15/2002 Document Revised: 09/24/2015 Document Reviewed: 09/24/2015 Elsevier Interactive Patient Education  2018 [employer].     Laparoscopic Colectomy, Care After This sheet gives you information about how to care for yourself after your procedure. Your health care provider may also give you more specific instructions. If you have problems or questions, contact your health care provider. What can I expect after the procedure? After your procedure, it is common to have the following:  Pain in your abdomen, especially in the incision areas. You will be given medicine to control the pain.  Tiredness. This is a normal part of the recovery process. Your energy level will return to normal over the next several weeks.  Changes in your bowel movements, such as  constipation or needing to go more often. Talk with your health care provider about how to manage this.  Follow these instructions at home: Medicines  Take over-the-counter and prescription medicines only as told by your health care provider.  Do not drive or use heavy machinery while taking prescription pain medicine.  Do not drink alcohol while taking prescription pain medicine.  If you were prescribed an antibiotic medicine, use it as told by your health care provider. Do not stop using the antibiotic even if you start to feel better. Incision care  Follow instructions from your health care provider about how to take care of your incision areas. Make sure you: ? Keep your incisions clean and dry. ? Wash your hands with soap and water before and after applying medicine to the areas, and before and after changing your bandage (dressing). If soap and water are not available, use hand sanitizer. ? Change your dressing as told by your health care provider. ? Leave stitches (sutures), skin glue, or adhesive strips in place. These skin closures may need to stay in place for 2 weeks or longer. If adhesive strip edges start to loosen and curl up, you may trim the loose edges. Do not remove adhesive strips completely unless your health care provider tells you to do that.  Do not wear tight clothing over the incisions. Tight clothing may rub and irritate the incision areas, which may cause the incisions to open.  Do not take baths, swim, or use a hot tub until your health care provider approves. Ask your health care provider if you can take showers. You may only be allowed to take sponge baths for bathing.  Check your incision area every day for signs of infection. Check for: ? More redness, swelling, or pain. ? More fluid or blood. ? Warmth. ? Pus or a bad smell. Activity  Avoid lifting anything that is heavier than 10 lb (4.5 kg) for 2 weeks or until your health care provider says it is  okay.  You may resume normal activities as told by your health care provider. Ask your health care provider what activities are safe for you.  Take rest breaks during the day as needed. Eating and drinking  Follow instructions from your health care provider about what you can eat after surgery.  To prevent or treat constipation while you are taking prescription pain medicine, your health care provider may recommend that you: ? Drink enough fluid to keep your urine clear or pale yellow. ? Take over-the-counter or prescription medicines. ? Eat foods that are high in fiber, such as fresh fruits and vegetables, whole grains, and beans. ? Limit foods that are high in  fat and processed sugars, such as fried and sweet foods. General instructions  Ask your health care provider when you will need an appointment to get your sutures or staples removed.  Keep all follow-up visits as told by your health care provider. This is important. Contact a health care provider if:  You have more redness, swelling, or pain around your incisions.  You have more fluid or blood coming from the incisions.  Your incisions feel warm to the touch.  You have pus or a bad smell coming from your incisions or your dressing.  You have a fever.  You have an incision that breaks open (edges not staying together) after sutures or staples have been removed. Get help right away if:  You develop a rash.  You have chest pain or difficulty breathing.  You have pain or swelling in your legs.  You feel light-headed or you faint.  Your abdomen swells (becomes distended).  You have nausea or vomiting.  You have blood in your stool (feces). This information is not intended to replace advice given to you by your health care provider. Make sure you discuss any questions you have with your health care provider. Document Released: 07/12/2004 Document Revised: 09/24/2015 Document Reviewed: 09/24/2015 Elsevier Interactive  Patient Education  [employer].

## 2019-06-21 NOTE — Telephone Encounter (Signed)
Pt has been advised of Pre-Admission date/time, COVID Testing date and Surgery date.  Surgery Date: 06/29/19 Preadmission Testing Date: 06/27/19 (phone 8a-1p) Covid Testing Date: 06/28/19 - patient advised to go to the [facility] between 8a-1p   Patient has been made aware to call [phone], between 1-3:00pm the day before surgery, to find out what time to arrive for surgery.

## 2019-06-21 NOTE — Progress Notes (Signed)
Pt doing great, has a good appetite and has good BMs. No abd. pain

## 2019-06-23 ENCOUNTER — Encounter: Payer: Self-pay | Admitting: Oncology

## 2019-06-23 NOTE — Progress Notes (Signed)
Hematology/Oncology Consult note [facility]  Telephone:([phone] [phone]  Patient Care Team: Barbaraann Boys, MD as PCP - General (Family Medicine) Clent Jacks, RN as Oncology Nurse Navigator   Name of the patient: Kirk Rodriguez  [id number]  [birth year]   Date of visit: 06/23/19  Diagnosis-new diagnosis of colon cancer  Chief complaint/ Reason for visit-discuss CT scan results and further management  Heme/Onc history: patient is a 74 year old male who was seen by Dr. Bonna Gains from GI for positive fit test. He underwent a colonoscopy on 06/02/2019 which showed 6 sessile polyps in the transverse colon ascending colon and cecum.  2 sessile semipedunculated nonbleeding polyps in the sigmoid colon and descending colon.  An ulcerating nonobstructing large mass 15 cm proximal to the anus.  Mass was noncircumferential.  No bleeding was present. Right and left colon polyps pathology was consistent with a tubular adenoma negative for high-grade dysplasia and malignancy.  Colorectal mass positive for invasive colon adenocarcinoma.  Sigmoid colon polyp tubulovillous adenoma.  Negative for high-grade dysplasia.  CT chest abdomen pelvis did not show any findings of metastatic disease.He was noted to have small scattered bilateral pulmonary nodules which were unchanged from prior exams and possibly benign.  Mild irregular wall thickening along the right lateral aspect of the upper rectum   Interval history-patient is currently doing well and denies any complaints at this time  ECOG PS- 1 Pain scale- 0   Review of systems- Review of Systems  Constitutional: Negative for chills, fever, malaise/fatigue and weight loss.  HENT: Negative for congestion, ear discharge and nosebleeds.   Eyes: Negative for blurred vision.  Respiratory: Negative for cough, hemoptysis, sputum production, shortness of breath and wheezing.   Cardiovascular: Negative for chest  pain, palpitations, orthopnea and claudication.  Gastrointestinal: Negative for abdominal pain, blood in stool, constipation, diarrhea, heartburn, melena, nausea and vomiting.  Genitourinary: Negative for dysuria, flank pain, frequency, hematuria and urgency.  Musculoskeletal: Negative for back pain, joint pain and myalgias.  Skin: Negative for rash.  Neurological: Negative for dizziness, tingling, focal weakness, seizures, weakness and headaches.  Endo/Heme/Allergies: Does not bruise/bleed easily.  Psychiatric/Behavioral: Negative for depression and suicidal ideas. The patient does not have insomnia.       No Known Allergies   Past Medical History:  Diagnosis Date  . Allergy   . Cancer ([place])    Larynx--carcinoma in situ  . Hyperlipidemia   . Wears dentures    full upper, partial lower     Past Surgical History:  Procedure Laterality Date  . COLONOSCOPY WITH PROPOFOL N/A 06/02/2019   Procedure: COLONOSCOPY WITH PROPOFOL;  Surgeon: Virgel Manifold, MD;  Location: [facility];  Service: Endoscopy;  Laterality: N/A;  . DIRECT LARYNGOSCOPY Right 04/03/2016   Procedure: DIRECT MICROLARYNGOSCOPY WITH EXCISON RIGHT VOCAL CORD LESION;  Surgeon: Margaretha Sheffield, MD;  Location: [place];  Service: ENT;  Laterality: Right;  RIGHT   . INGUINAL HERNIA REPAIR Bilateral 12/18/2015   Procedure: LAPAROSCOPIC BILATERAL INGUINAL HERNIA REPAIR;  Surgeon: Hubbard Robinson, MD;  Location: [facility] OR[facility];  Service: General;  Laterality: Bilateral;  . MINOR EXCISION OF ORAL LESION Left 03/06/2016   Procedure: direct microlaryngoscopy with excision left vocal cord lesion;  Surgeon: Margaretha Sheffield, MD;  Location: [place];  Service: ENT;  Laterality: Left;  left vocal cord lesion  . TRIGGER FINGER RELEASE Left 2016  . VASECTOMY Bilateral 1978    Social History   Socioeconomic History  . Marital  status: Married    Spouse name: Not on file  . Number of children: Not on file  . Years  of education: Not on file  . Highest education level: Not on file  Occupational History  . Not on file  Tobacco Use  . Smoking status: Former Smoker    Packs/day: 1.50    Years: 53.00    Pack years: 79.50    Types: Cigarettes    Quit date: 04/18/2015    Years since quitting: 4.1  . Smokeless tobacco: Never Used  Vaping Use  . Vaping Use: Never used  Substance and Sexual Activity  . Alcohol use: Not Currently  . Drug use: No  . Sexual activity: Not on file  Other Topics Concern  . Not on file  Social History Narrative  . Not on file   Social Determinants of Health   Financial Resource Strain:   . Difficulty of Paying Living Expenses:   Food Insecurity:   . Worried About Charity fundraiser in the Last Year:   . Arboriculturist in the Last Year:   Transportation Needs:   . Film/video editor (Medical):   Marland Kitchen Lack of Transportation (Non-Medical):   Physical Activity:   . Days of Exercise per Week:   . Minutes of Exercise per Session:   Stress:   . Feeling of Stress :   Social Connections:   . Frequency of Communication with Friends and Family:   . Frequency of Social Gatherings with Friends and Family:   . Attends Religious Services:   . Active Member of Clubs or Organizations:   . Attends Archivist Meetings:   Marland Kitchen Marital Status:   Intimate Partner Violence:   . Fear of Current or Ex-Partner:   . Emotionally Abused:   Marland Kitchen Physically Abused:   . Sexually Abused:     Family History  Problem Relation Age of Onset  . Lung cancer Father   . Heart disease Father   . Hypertension Father   . Polycythemia Mother   . Heart disease Mother      Current Outpatient Medications:  .  acetaminophen (TYLENOL) 500 MG tablet, Take 500 mg by mouth every 6 (six) hours as needed (pain.). , Disp: , Rfl:  .  aspirin EC 81 MG tablet, Take 81 mg by mouth daily. , Disp: , Rfl:  .  Cholecalciferol (VITAMIN D3) 2000 units capsule, Take 2,000 Units by mouth daily. , Disp: ,  Rfl:  .  simvastatin (ZOCOR) 80 MG tablet, Take 80 mg by mouth at bedtime. , Disp: , Rfl:  .  triamcinolone (NASACORT AQ) 55 MCG/ACT AERO nasal inhaler, Place 1 spray into the nose daily. , Disp: , Rfl:  .  beta carotene w/minerals (OCUVITE) tablet, Take 1 tablet by mouth at bedtime., Disp: , Rfl:  .  bisacodyl (DULCOLAX) 5 MG EC tablet, Take all 4 tablets at 8 am the morning prior to your surgery., Disp: 4 tablet, Rfl: 0 .  erythromycin base (E-MYCIN) 500 MG tablet, Take 2 tablets at 8am, 2 tablets at 2pm, and 2 tablets at 8pm the day prior to surgery., Disp: 6 tablet, Rfl: 0 .  Liniments (BLUE-EMU SUPER STRENGTH EX), Apply 1 application topically 2 (two) times daily as needed (applied to right hand as needed for soreness/pain.)., Disp: , Rfl:  .  Misc Natural Products (OSTEO BI-FLEX TRIPLE STRENGTH PO), Take 1 tablet by mouth in the morning and at bedtime., Disp: , Rfl:  .  Multiple Vitamin (MULTIVITAMIN WITH MINERALS) TABS tablet, Take 1 tablet by mouth daily., Disp: , Rfl:  .  Naproxen Sod-diphenhydrAMINE (ALEVE PM) 220-25 MG TABS, Take 0.5 mg by mouth at bedtime as needed (sleep.)., Disp: , Rfl:  .  neomycin (MYCIFRADIN) 500 MG tablet, Take 2 tablet at 8am, take 2 tablets at 2pm, and take 2 tablets at 8pm the day prior to your surgery, Disp: 6 tablet, Rfl: 0 .  polyethylene glycol powder (MIRALAX) 17 GM/SCOOP powder, Mix full container in 64 ounces of Gatorade or other clear liquid. No Red, Disp: 238 g, Rfl: 0  Physical exam:  Vitals:   06/21/19 0940 06/21/19 0951  BP: 113/87 116/84  Temp: (!) 96.5 F (35.8 C)   TempSrc: Tympanic   Weight: 195 lb 9.6 oz (88.7 kg)    Physical Exam Constitutional:      General: He is not in acute distress. Cardiovascular:     Rate and Rhythm: Normal rate and regular rhythm.     Heart sounds: Normal heart sounds.  Pulmonary:     Effort: Pulmonary effort is normal.     Breath sounds: Normal breath sounds.  Abdominal:     General: Bowel sounds are  normal.     Palpations: Abdomen is soft.  Skin:    General: Skin is warm and dry.  Neurological:     Mental Status: He is alert and oriented to person, place, and time.      CMP Latest Ref Rng & Units 06/13/2019  Glucose 70 - 99 mg/dL 105(H)  BUN 8 - 23 mg/dL 22  Creatinine 0.61 - 1.24 mg/dL 1.16  Sodium 135 - 145 mmol/L 138  Potassium 3.5 - 5.1 mmol/L 3.9  Chloride 98 - 111 mmol/L 104  CO2 22 - 32 mmol/L 25  Calcium 8.9 - 10.3 mg/dL 9.2  Total Protein 6.5 - 8.1 g/dL 7.2  Total Bilirubin 0.3 - 1.2 mg/dL 0.7  Alkaline Phos 38 - 126 U/L 56  AST 15 - 41 U/L 15  ALT 0 - 44 U/L 20   CBC Latest Ref Rng & Units 06/13/2019  WBC 4.0 - 10.5 K/uL 10.0  Hemoglobin 13.0 - 17.0 g/dL 13.7  Hematocrit 39 - 52 % 40.7  Platelets 150 - 400 K/uL 283    No images are attached to the encounter.  CT Chest W Contrast  Result Date: 06/18/2019 CLINICAL DATA:  Newly diagnosed colon cancer. History of vocal cord cancer, status post radiation in 2018. EXAM: CT CHEST, ABDOMEN, AND PELVIS WITH CONTRAST TECHNIQUE: Multidetector CT imaging of the chest, abdomen and pelvis was performed following the standard protocol during bolus administration of intravenous contrast. CONTRAST:  119mL OMNIPAQUE IOHEXOL 300 MG/ML  SOLN COMPARISON:  Low-dose lung cancer screening CT chest dated 09/29/2018 FINDINGS: CT CHEST FINDINGS Cardiovascular: Heart is normal in size.  No pericardial effusion. No evidence of thoracic aortic aneurysm. Mild atherosclerotic calcifications of the aortic arch. Mild coronary atherosclerosis of the LAD and right coronary artery. Mediastinum/Nodes: Small mediastinal and right hilar nodes which do not meet pathologic CT size criteria. Visualized thyroid is unremarkable. Lungs/Pleura: Severe centrilobular and paraseptal emphysematous changes, upper lung predominant. Mild biapical pleural-parenchymal scarring. Scattered small bilateral pulmonary nodules (for example, series 4/images 29, 64, and 66),  unchanged from priors, benign. No focal consolidation. No pleural effusion or pneumothorax. Musculoskeletal: Mild degenerative changes of the mid thoracic spine. CT ABDOMEN PELVIS FINDINGS Hepatobiliary: Liver is within normal limits. No suspicious/enhancing hepatic lesions. Gallbladder is unremarkable. No intrahepatic or extrahepatic  ductal dilatation. Pancreas: Within normal limits. Spleen: Within normal limits. Adrenals/Urinary Tract: Adrenal glands are within normal limits. Kidneys are within normal limits.  No hydronephrosis. Mildly thick-walled bladder, although underdistended. Stomach/Bowel: Stomach is within normal limits. No evidence of bowel obstruction. Normal appendix (series 2/image 88). Scattered left colonic diverticulosis, without evidence of diverticulitis. Mild irregular wall thickening along the right lateral aspect of the upper rectum (series 2/image 13; coronal image 94), equivocal; correlate with recent colonoscopy. Otherwise, no evidence of colonic mass on CT. Vascular/Lymphatic: No evidence of abdominal aortic aneurysm. Atherosclerotic calcifications of the abdominal aorta and branch vessels. No suspicious abdominopelvic lymphadenopathy. Reproductive: Prostate is grossly unremarkable. Other: No abdominopelvic ascites. Musculoskeletal: Mild degenerative changes of the lumbar spine. IMPRESSION: Mild irregular wall thickening along the right lateral aspect of the upper rectum, equivocal. Correlate with recent colonoscopy. No findings suspicious for metastatic disease in the chest, abdomen, or pelvis. Small bilateral pulmonary nodules are unchanged from priors, benign. Aortic Atherosclerosis (ICD10-I70.0) and Emphysema (ICD10-J43.9). Electronically Signed   By: Julian Hy M.D.   On: 06/18/2019 02:09   CT ABDOMEN PELVIS W CONTRAST  Result Date: 06/18/2019 CLINICAL DATA:  Newly diagnosed colon cancer. History of vocal cord cancer, status post radiation in 2018. EXAM: CT CHEST, ABDOMEN,  AND PELVIS WITH CONTRAST TECHNIQUE: Multidetector CT imaging of the chest, abdomen and pelvis was performed following the standard protocol during bolus administration of intravenous contrast. CONTRAST:  152mL OMNIPAQUE IOHEXOL 300 MG/ML  SOLN COMPARISON:  Low-dose lung cancer screening CT chest dated 09/29/2018 FINDINGS: CT CHEST FINDINGS Cardiovascular: Heart is normal in size.  No pericardial effusion. No evidence of thoracic aortic aneurysm. Mild atherosclerotic calcifications of the aortic arch. Mild coronary atherosclerosis of the LAD and right coronary artery. Mediastinum/Nodes: Small mediastinal and right hilar nodes which do not meet pathologic CT size criteria. Visualized thyroid is unremarkable. Lungs/Pleura: Severe centrilobular and paraseptal emphysematous changes, upper lung predominant. Mild biapical pleural-parenchymal scarring. Scattered small bilateral pulmonary nodules (for example, series 4/images 29, 64, and 66), unchanged from priors, benign. No focal consolidation. No pleural effusion or pneumothorax. Musculoskeletal: Mild degenerative changes of the mid thoracic spine. CT ABDOMEN PELVIS FINDINGS Hepatobiliary: Liver is within normal limits. No suspicious/enhancing hepatic lesions. Gallbladder is unremarkable. No intrahepatic or extrahepatic ductal dilatation. Pancreas: Within normal limits. Spleen: Within normal limits. Adrenals/Urinary Tract: Adrenal glands are within normal limits. Kidneys are within normal limits.  No hydronephrosis. Mildly thick-walled bladder, although underdistended. Stomach/Bowel: Stomach is within normal limits. No evidence of bowel obstruction. Normal appendix (series 2/image 88). Scattered left colonic diverticulosis, without evidence of diverticulitis. Mild irregular wall thickening along the right lateral aspect of the upper rectum (series 2/image 13; coronal image 94), equivocal; correlate with recent colonoscopy. Otherwise, no evidence of colonic mass on CT.  Vascular/Lymphatic: No evidence of abdominal aortic aneurysm. Atherosclerotic calcifications of the abdominal aorta and branch vessels. No suspicious abdominopelvic lymphadenopathy. Reproductive: Prostate is grossly unremarkable. Other: No abdominopelvic ascites. Musculoskeletal: Mild degenerative changes of the lumbar spine. IMPRESSION: Mild irregular wall thickening along the right lateral aspect of the upper rectum, equivocal. Correlate with recent colonoscopy. No findings suspicious for metastatic disease in the chest, abdomen, or pelvis. Small bilateral pulmonary nodules are unchanged from priors, benign. Aortic Atherosclerosis (ICD10-I70.0) and Emphysema (ICD10-J43.9). Electronically Signed   By: Julian Hy M.D.   On: 06/18/2019 02:09     Assessment and plan- Patient is a 74 y.o. male with newly diagnosed adenocarcinoma of the sigmoid colon/rectum  CT chest abdomen and pelvis with  contrast does not show any evidence of distant metastatic disease.  Patient is seeing Dr. Christian Mate later today and plan is likely for sigmoid colectomy as this appears to be sigmoid rather than true rectal cancer.  I will await final pathology results at this time and based on pathologic staging decide if patient would benefit from adjuvant chemotherapy.  Baseline CEA ferritin and iron studies are normal.  I will see him back in 1 month no labs   Visit Diagnosis 1. Colon neoplasm      Dr. Randa Evens, MD, MPH [facility] [phone] 06/23/2019 4:19 PM

## 2019-06-27 ENCOUNTER — Encounter
Admission: RE | Admit: 2019-06-27 | Discharge: 2019-06-27 | Disposition: A | Discharge status: Home / Self Care | Payer: Medicare PPO | Source: Ambulatory Visit | Attending: Surgery | Admitting: Surgery

## 2019-06-27 ENCOUNTER — Other Ambulatory Visit: Payer: Self-pay

## 2019-06-27 DIAGNOSIS — Z01818 Encounter for other preprocedural examination: Secondary | ICD-10-CM | POA: Insufficient documentation

## 2019-06-27 HISTORY — DX: Gastro-esophageal reflux disease without esophagitis: K21.9

## 2019-06-27 HISTORY — DX: Chronic obstructive pulmonary disease, unspecified: J44.9

## 2019-06-27 HISTORY — DX: Headache, unspecified: R51.9

## 2019-06-27 HISTORY — DX: Cardiac murmur, unspecified: R01.1

## 2019-06-27 NOTE — Patient Instructions (Signed)
Your procedure is scheduled on: 06-29-19 [facility] Report to Same Day Surgery 2nd floor medical mall [facility] Entrance-take elevator on left to 2nd floor.  Check in with surgery information desk.) To find out your arrival time please call [phone] between 1PM - 3PM on 06-28-19 TUESDAY  Remember: Instructions that are not followed completely may result in serious medical risk, up to and including death, or upon the discretion of your surgeon and anesthesiologist your surgery may need to be rescheduled.    _x___ 1. Do not eat food after midnight the night before your procedure. NO GUM OR CANDY AFTER MIDNIGHT. You may drink clear liquids up to 2 hours before you are scheduled to arrive at the hospital for your procedure.  Do not drink clear liquids within 2 hours of your scheduled arrival to the hospital.  Clear liquids include  --Water or Apple juice without pulp  --Gatorade  --Black Coffee or Clear Tea (No milk, no creamers, do not add anything to the coffee or Tea-ok to add sugar)  _X___Ensure clear carbohydrate drink 3 hours before surgery-FINISH DRINK 2 HOURS PRIOR TO ARRIVAL [place]    __x__ 2. No Alcohol for 24 hours before or after surgery.   __x__3. No Smoking or e-cigarettes for 24 prior to surgery.  Do not use any chewable tobacco products for at least 6 hour prior to surgery   ____  4. Bring all medications with you on the day of surgery if instructed.    __x__ 5. Notify your doctor if there is any change in your medical condition     (cold, fever, infections).    x___6. On the morning of surgery brush your teeth with toothpaste and water.  You may rinse your mouth with mouth wash if you wish.  Do not swallow any toothpaste or mouthwash.   Do not wear jewelry, make-up, hairpins, clips or nail polish.  Do not wear lotions, powders, or perfumes.   Do not shave 48 hours prior to surgery. Men may shave face and neck.  Do not bring valuables to the  hospital.    [facility] is not responsible for any belongings or valuables.               Contacts, dentures or bridgework may not be worn into surgery.  Leave your suitcase in the car. After surgery it may be brought to your room.  For patients admitted to the hospital, discharge time is determined by your treatment team.  _  Patients discharged the day of surgery will not be allowed to drive home.  You will need someone to drive you home and stay with you the night of your procedure.    Please read over the following fact sheets that you were given:   [facility] Preparing for Surgery/INCENTIVE SPIROMETER INSTRUCTIONS-THE ACTUAL INCENTIVE SPIROMETER DEVICE WILL BE  GIVEN TO YOU WHEN YOU GET TO YOUR ROOM AFTER SURGERY  ____ TAKE THE FOLLOWING MEDICATION THE MORNING OF SURGERY WITH A SMALL SIP OF WATER. These include:  1. NONE  2.  3.  4.  5.  6.  ____Fleets enema or Magnesium Citrate as directed.   _x___ Use CHG Soap or sage wipes as directed on instruction sheet   ____ Use inhalers on the day of surgery and bring to hospital day of surgery  ____ Stop Metformin and Janumet 2 days prior to surgery.    ____ Take 1/2 of usual insulin dose the night before  surgery and none on the morning surgery.   _x___ Follow recommendations from Cardiologist, Pulmonologist or PCP regarding stopping Aspirin, Coumadin, Plavix ,Eliquis, Effient, or Pradaxa, and Pletal-STOPPED ASPIRIN ON 06-22-19  X____Stop Anti-inflammatories such as Advil, Aleve, Ibuprofen, Motrin, Naproxen, Naprosyn, Goodies powders or aspirin products NOW-OK to take Tylenol    _x___ Stop supplements until after surgery-STOPPED ALL VITAMINS/SUPPLEMENTS ON 06-22-19   ____ Bring C-Pap to the hospital.

## 2019-06-28 ENCOUNTER — Encounter
Admission: RE | Admit: 2019-06-28 | Discharge: 2019-06-28 | Disposition: A | Discharge status: Home / Self Care | Payer: Medicare PPO | Source: Ambulatory Visit | Attending: Surgery | Admitting: Surgery

## 2019-06-28 ENCOUNTER — Other Ambulatory Visit: Payer: Self-pay | Admitting: Radiology

## 2019-06-28 ENCOUNTER — Other Ambulatory Visit: Payer: Medicare PPO

## 2019-06-28 DIAGNOSIS — Z01818 Encounter for other preprocedural examination: Secondary | ICD-10-CM | POA: Insufficient documentation

## 2019-06-28 DIAGNOSIS — Z20822 Contact with and (suspected) exposure to covid-19: Secondary | ICD-10-CM | POA: Insufficient documentation

## 2019-06-28 LAB — SARS CORONAVIRUS 2 (TAT 6-24 HRS): SARS Coronavirus 2: NEGATIVE

## 2019-06-29 ENCOUNTER — Encounter
Admission: RE | Disposition: A | Discharge status: Home / Self Care | Payer: Self-pay | Source: Home / Self Care | Attending: Surgery

## 2019-06-29 ENCOUNTER — Inpatient Hospital Stay: Payer: Medicare PPO | Admitting: Anesthesiology

## 2019-06-29 ENCOUNTER — Inpatient Hospital Stay
Admission: RE | Admit: 2019-06-29 | Discharge: 2019-07-01 | DRG: 331 | Disposition: A | Discharge status: Home / Self Care | Payer: Medicare PPO | Attending: Surgery | Admitting: Surgery

## 2019-06-29 ENCOUNTER — Encounter: Payer: Self-pay | Admitting: Surgery

## 2019-06-29 ENCOUNTER — Other Ambulatory Visit: Payer: Self-pay

## 2019-06-29 DIAGNOSIS — E785 Hyperlipidemia, unspecified: Secondary | ICD-10-CM | POA: Diagnosis present

## 2019-06-29 DIAGNOSIS — Z87891 Personal history of nicotine dependence: Secondary | ICD-10-CM

## 2019-06-29 DIAGNOSIS — Z79899 Other long term (current) drug therapy: Secondary | ICD-10-CM | POA: Diagnosis not present

## 2019-06-29 DIAGNOSIS — C19 Malignant neoplasm of rectosigmoid junction: Principal | ICD-10-CM | POA: Diagnosis present

## 2019-06-29 DIAGNOSIS — C187 Malignant neoplasm of sigmoid colon: Secondary | ICD-10-CM | POA: Diagnosis present

## 2019-06-29 DIAGNOSIS — Z408 Encounter for other prophylactic surgery: Secondary | ICD-10-CM

## 2019-06-29 DIAGNOSIS — Z801 Family history of malignant neoplasm of trachea, bronchus and lung: Secondary | ICD-10-CM

## 2019-06-29 DIAGNOSIS — I10 Essential (primary) hypertension: Secondary | ICD-10-CM | POA: Diagnosis present

## 2019-06-29 DIAGNOSIS — D72829 Elevated white blood cell count, unspecified: Secondary | ICD-10-CM | POA: Diagnosis present

## 2019-06-29 DIAGNOSIS — Z20822 Contact with and (suspected) exposure to covid-19: Secondary | ICD-10-CM | POA: Diagnosis present

## 2019-06-29 DIAGNOSIS — Z7982 Long term (current) use of aspirin: Secondary | ICD-10-CM

## 2019-06-29 DIAGNOSIS — J449 Chronic obstructive pulmonary disease, unspecified: Secondary | ICD-10-CM | POA: Diagnosis present

## 2019-06-29 DIAGNOSIS — Z86005 Personal history of in-situ neoplasm of middle ear and respiratory system: Secondary | ICD-10-CM | POA: Diagnosis not present

## 2019-06-29 DIAGNOSIS — D490 Neoplasm of unspecified behavior of digestive system: Secondary | ICD-10-CM

## 2019-06-29 HISTORY — PX: XI ROBOTIC ASSISTED LOWER ANTERIOR RESECTION: SHX6558

## 2019-06-29 HISTORY — PX: CYSTOSCOPY: SHX5120

## 2019-06-29 LAB — HEMOGLOBIN A1C
Hgb A1c MFr Bld: 6 % — ABNORMAL HIGH (ref 4.8–5.6)
Mean Plasma Glucose: 126 mg/dL

## 2019-06-29 LAB — TYPE AND SCREEN
ABO/RH(D): A NEG
Antibody Screen: NEGATIVE

## 2019-06-29 LAB — HEMOGLOBIN AND HEMATOCRIT, BLOOD
HCT: 39.2 % (ref 39.0–52.0)
Hemoglobin: 13.1 g/dL (ref 13.0–17.0)

## 2019-06-29 LAB — ABO/RH: ABO/RH(D): A NEG

## 2019-06-29 SURGERY — RESECTION, RECTUM, LOW ANTERIOR, ROBOT-ASSISTED
Anesthesia: General | Site: Ureter

## 2019-06-29 MED ORDER — ONDANSETRON HCL 4 MG/2ML IJ SOLN
INTRAMUSCULAR | Status: AC
  Filled 2019-06-29: qty 2

## 2019-06-29 MED ORDER — BUPIVACAINE-EPINEPHRINE (PF) 0.25% -1:200000 IJ SOLN
INTRAMUSCULAR | Status: DC | PRN
  Administered 2019-06-29: 30 mL

## 2019-06-29 MED ORDER — SODIUM CHLORIDE 0.9 % IV SOLN
2.0000 g | INTRAVENOUS | Status: AC
  Administered 2019-06-29 (×2): 2 g via INTRAVENOUS

## 2019-06-29 MED ORDER — GLYCOPYRROLATE 0.2 MG/ML IJ SOLN
INTRAMUSCULAR | Status: AC
  Filled 2019-06-29: qty 1

## 2019-06-29 MED ORDER — ACETAMINOPHEN 500 MG PO TABS: 1000.0000 mg | ORAL_TABLET | ORAL | Status: AC

## 2019-06-29 MED ORDER — ALVIMOPAN 12 MG PO CAPS
12.0000 mg | ORAL_CAPSULE | Freq: Two times a day (BID) | ORAL | Status: DC
  Filled 2019-06-29: qty 1

## 2019-06-29 MED ORDER — FENTANYL CITRATE (PF) 100 MCG/2ML IJ SOLN
INTRAMUSCULAR | Status: DC | PRN
  Administered 2019-06-29 (×4): 50 ug via INTRAVENOUS

## 2019-06-29 MED ORDER — KETOROLAC TROMETHAMINE 15 MG/ML IJ SOLN
15.0000 mg | Freq: Four times a day (QID) | INTRAMUSCULAR | Status: DC | PRN
  Administered 2019-06-29: 15 mg via INTRAVENOUS

## 2019-06-29 MED ORDER — FENTANYL CITRATE (PF) 100 MCG/2ML IJ SOLN
INTRAMUSCULAR | Status: AC
  Filled 2019-06-29: qty 2

## 2019-06-29 MED ORDER — PROPOFOL 10 MG/ML IV BOLUS
INTRAVENOUS | Status: AC
  Filled 2019-06-29: qty 20

## 2019-06-29 MED ORDER — OXYCODONE-ACETAMINOPHEN 5-325 MG PO TABS: 1.0000 | ORAL_TABLET | ORAL | Status: DC | PRN

## 2019-06-29 MED ORDER — PROMETHAZINE HCL 25 MG/ML IJ SOLN: 6.2500 mg | INTRAMUSCULAR | Status: DC | PRN

## 2019-06-29 MED ORDER — FAMOTIDINE 20 MG PO TABS: 20.0000 mg | ORAL_TABLET | Freq: Once | ORAL | Status: AC

## 2019-06-29 MED ORDER — EPHEDRINE SULFATE 50 MG/ML IJ SOLN
INTRAMUSCULAR | Status: DC | PRN
  Administered 2019-06-29 (×2): 10 mg via INTRAVENOUS

## 2019-06-29 MED ORDER — SODIUM CHLORIDE 0.9 % IV SOLN
INTRAVENOUS | Status: AC
  Filled 2019-06-29: qty 2

## 2019-06-29 MED ORDER — LACTATED RINGERS IV SOLN: INTRAVENOUS | Status: DC | PRN

## 2019-06-29 MED ORDER — FENTANYL CITRATE (PF) 100 MCG/2ML IJ SOLN
INTRAMUSCULAR | Status: AC
  Administered 2019-06-29: 50 ug via INTRAVENOUS
  Filled 2019-06-29: qty 2

## 2019-06-29 MED ORDER — PHENYLEPHRINE HCL (PRESSORS) 10 MG/ML IV SOLN
INTRAVENOUS | Status: AC
  Filled 2019-06-29: qty 1

## 2019-06-29 MED ORDER — PHENYLEPHRINE HCL (PRESSORS) 10 MG/ML IV SOLN
INTRAVENOUS | Status: DC | PRN
  Administered 2019-06-29: 100 ug via INTRAVENOUS
  Administered 2019-06-29 (×2): 200 ug via INTRAVENOUS

## 2019-06-29 MED ORDER — EPHEDRINE 5 MG/ML INJ
INTRAVENOUS | Status: AC
  Filled 2019-06-29: qty 10

## 2019-06-29 MED ORDER — ONDANSETRON HCL 4 MG/2ML IJ SOLN: 4.0000 mg | Freq: Four times a day (QID) | INTRAMUSCULAR | Status: DC | PRN

## 2019-06-29 MED ORDER — DIPHENHYDRAMINE-APAP (SLEEP) 25-500 MG PO TABS: 0.5000 | ORAL_TABLET | Freq: Every day | ORAL | Status: DC

## 2019-06-29 MED ORDER — CHLORHEXIDINE GLUCONATE CLOTH 2 % EX PADS: 6.0000 | MEDICATED_PAD | Freq: Once | CUTANEOUS | Status: DC

## 2019-06-29 MED ORDER — SUGAMMADEX SODIUM 200 MG/2ML IV SOLN
INTRAVENOUS | Status: DC | PRN
  Administered 2019-06-29: 160 mg via INTRAVENOUS

## 2019-06-29 MED ORDER — ACETAMINOPHEN 500 MG PO TABS
250.0000 mg | ORAL_TABLET | Freq: Every day | ORAL | Status: DC
  Administered 2019-06-29 – 2019-06-30 (×2): 250 mg via ORAL
  Filled 2019-06-29 (×2): qty 1

## 2019-06-29 MED ORDER — BUPIVACAINE LIPOSOME 1.3 % IJ SUSP: 20.0000 mL | Freq: Once | INTRAMUSCULAR | Status: DC

## 2019-06-29 MED ORDER — VITAMIN D3 25 MCG (1000 UNIT) PO TABS
2000.0000 [IU] | ORAL_TABLET | Freq: Every day | ORAL | Status: DC
  Administered 2019-06-30 – 2019-07-01 (×2): 2000 [IU] via ORAL
  Filled 2019-06-29 (×4): qty 2

## 2019-06-29 MED ORDER — CELECOXIB 200 MG PO CAPS: 200.0000 mg | ORAL_CAPSULE | ORAL | Status: AC

## 2019-06-29 MED ORDER — ROCURONIUM BROMIDE 100 MG/10ML IV SOLN
INTRAVENOUS | Status: DC | PRN
  Administered 2019-06-29 (×2): 10 mg via INTRAVENOUS
  Administered 2019-06-29: 5 mg via INTRAVENOUS
  Administered 2019-06-29: 20 mg via INTRAVENOUS
  Administered 2019-06-29: 50 mg via INTRAVENOUS
  Administered 2019-06-29: 20 mg via INTRAVENOUS
  Administered 2019-06-29 (×3): 10 mg via INTRAVENOUS

## 2019-06-29 MED ORDER — ALVIMOPAN 12 MG PO CAPS
ORAL_CAPSULE | ORAL | Status: AC
  Administered 2019-06-29: 12 mg via ORAL
  Filled 2019-06-29: qty 1

## 2019-06-29 MED ORDER — BUPIVACAINE LIPOSOME 1.3 % IJ SUSP
INTRAMUSCULAR | Status: AC
  Filled 2019-06-29: qty 20

## 2019-06-29 MED ORDER — BUPIVACAINE LIPOSOME 1.3 % IJ SUSP
INTRAMUSCULAR | Status: DC | PRN
  Administered 2019-06-29: 20 mL

## 2019-06-29 MED ORDER — SODIUM CHLORIDE 0.9 % IV SOLN
2.0000 g | Freq: Two times a day (BID) | INTRAVENOUS | Status: AC
  Administered 2019-06-29: 2 g via INTRAVENOUS
  Filled 2019-06-29: qty 2

## 2019-06-29 MED ORDER — DEXAMETHASONE SODIUM PHOSPHATE 10 MG/ML IJ SOLN
INTRAMUSCULAR | Status: DC | PRN
  Administered 2019-06-29: 10 mg via INTRAVENOUS

## 2019-06-29 MED ORDER — MEPERIDINE HCL 50 MG/ML IJ SOLN: 6.2500 mg | INTRAMUSCULAR | Status: DC | PRN

## 2019-06-29 MED ORDER — ROCURONIUM BROMIDE 10 MG/ML (PF) SYRINGE
PREFILLED_SYRINGE | INTRAVENOUS | Status: AC
  Filled 2019-06-29: qty 10

## 2019-06-29 MED ORDER — ATORVASTATIN CALCIUM 20 MG PO TABS
40.0000 mg | ORAL_TABLET | Freq: Every day | ORAL | Status: DC
  Administered 2019-06-30 – 2019-07-01 (×2): 40 mg via ORAL
  Filled 2019-06-29 (×2): qty 2

## 2019-06-29 MED ORDER — ALVIMOPAN 12 MG PO CAPS: 12.0000 mg | ORAL_CAPSULE | ORAL | Status: AC

## 2019-06-29 MED ORDER — DIPHENHYDRAMINE HCL 12.5 MG/5ML PO ELIX
12.5000 mg | ORAL_SOLUTION | Freq: Every day | ORAL | Status: DC
  Administered 2019-06-29 – 2019-06-30 (×2): 12.5 mg via ORAL
  Filled 2019-06-29 (×2): qty 5

## 2019-06-29 MED ORDER — CELECOXIB 200 MG PO CAPS
ORAL_CAPSULE | ORAL | Status: AC
  Administered 2019-06-29: 200 mg via ORAL
  Filled 2019-06-29: qty 1

## 2019-06-29 MED ORDER — LACTATED RINGERS IV SOLN: INTRAVENOUS | Status: DC

## 2019-06-29 MED ORDER — LIDOCAINE HCL (CARDIAC) PF 100 MG/5ML IV SOSY
PREFILLED_SYRINGE | INTRAVENOUS | Status: DC | PRN
  Administered 2019-06-29: 100 mg via INTRAVENOUS

## 2019-06-29 MED ORDER — ONDANSETRON HCL 4 MG/2ML IJ SOLN
INTRAMUSCULAR | Status: DC | PRN
  Administered 2019-06-29: 4 mg via INTRAVENOUS

## 2019-06-29 MED ORDER — FAMOTIDINE 20 MG PO TABS
ORAL_TABLET | ORAL | Status: AC
  Administered 2019-06-29: 20 mg via ORAL
  Filled 2019-06-29: qty 1

## 2019-06-29 MED ORDER — CHLORHEXIDINE GLUCONATE 0.12 % MT SOLN
OROMUCOSAL | Status: AC
  Administered 2019-06-29: 15 mL via OROMUCOSAL
  Filled 2019-06-29: qty 15

## 2019-06-29 MED ORDER — ACETAMINOPHEN 500 MG PO TABS
ORAL_TABLET | ORAL | Status: AC
  Administered 2019-06-29: 1000 mg via ORAL
  Filled 2019-06-29: qty 2

## 2019-06-29 MED ORDER — METOPROLOL TARTRATE 5 MG/5ML IV SOLN: 5.0000 mg | Freq: Four times a day (QID) | INTRAVENOUS | Status: DC | PRN

## 2019-06-29 MED ORDER — GABAPENTIN 300 MG PO CAPS: 300.0000 mg | ORAL_CAPSULE | ORAL | Status: AC

## 2019-06-29 MED ORDER — KETOROLAC TROMETHAMINE 15 MG/ML IJ SOLN
INTRAMUSCULAR | Status: AC
  Filled 2019-06-29: qty 1

## 2019-06-29 MED ORDER — PROPOFOL 10 MG/ML IV BOLUS
INTRAVENOUS | Status: DC | PRN
  Administered 2019-06-29: 160 mg via INTRAVENOUS

## 2019-06-29 MED ORDER — BUPIVACAINE-EPINEPHRINE (PF) 0.25% -1:200000 IJ SOLN
INTRAMUSCULAR | Status: AC
  Filled 2019-06-29: qty 30

## 2019-06-29 MED ORDER — ENOXAPARIN SODIUM 40 MG/0.4ML ~~LOC~~ SOLN: 40.0000 mg | Freq: Once | SUBCUTANEOUS | Status: AC

## 2019-06-29 MED ORDER — SODIUM CHLORIDE 0.9 % IV SOLN
INTRAVENOUS | Status: DC | PRN
  Administered 2019-06-29: 60 ug/min via INTRAVENOUS

## 2019-06-29 MED ORDER — FENTANYL CITRATE (PF) 100 MCG/2ML IJ SOLN
25.0000 ug | INTRAMUSCULAR | Status: DC | PRN
  Administered 2019-06-29: 50 ug via INTRAVENOUS

## 2019-06-29 MED ORDER — CHLORHEXIDINE GLUCONATE 0.12 % MT SOLN: 15.0000 mL | Freq: Once | OROMUCOSAL | Status: AC

## 2019-06-29 MED ORDER — GABAPENTIN 300 MG PO CAPS
ORAL_CAPSULE | ORAL | Status: AC
  Administered 2019-06-29: 300 mg via ORAL
  Filled 2019-06-29: qty 1

## 2019-06-29 MED ORDER — SPY AGENT GREEN - (INDOCYANINE FOR INJECTION)
INTRAMUSCULAR | Status: DC | PRN
  Administered 2019-06-29: 1 mL via TOPICAL
  Administered 2019-06-29: 10 mL via TOPICAL
  Administered 2019-06-29: 1 mL via TOPICAL

## 2019-06-29 MED ORDER — OXYCODONE HCL 5 MG/5ML PO SOLN: 5.0000 mg | Freq: Once | ORAL | Status: DC | PRN

## 2019-06-29 MED ORDER — INDOCYANINE GREEN 25 MG IV SOLR
INTRAVENOUS | Status: DC | PRN
  Administered 2019-06-29: 50 mg

## 2019-06-29 MED ORDER — ORAL CARE MOUTH RINSE: 15.0000 mL | Freq: Once | OROMUCOSAL | Status: AC

## 2019-06-29 MED ORDER — TRIAMCINOLONE ACETONIDE 55 MCG/ACT NA AERO
1.0000 | INHALATION_SPRAY | Freq: Every day | NASAL | Status: DC
  Filled 2019-06-29: qty 10.8

## 2019-06-29 MED ORDER — MORPHINE SULFATE (PF) 2 MG/ML IV SOLN: 2.0000 mg | INTRAVENOUS | Status: DC | PRN

## 2019-06-29 MED ORDER — OXYCODONE HCL 5 MG PO TABS: 5.0000 mg | ORAL_TABLET | Freq: Once | ORAL | Status: DC | PRN

## 2019-06-29 MED ORDER — ENOXAPARIN SODIUM 40 MG/0.4ML ~~LOC~~ SOLN
SUBCUTANEOUS | Status: AC
  Administered 2019-06-29: 40 mg via SUBCUTANEOUS
  Filled 2019-06-29: qty 0.4

## 2019-06-29 MED ORDER — ONDANSETRON 4 MG PO TBDP
4.0000 mg | ORAL_TABLET | Freq: Four times a day (QID) | ORAL | Status: DC | PRN
  Filled 2019-06-29: qty 1

## 2019-06-29 SURGICAL SUPPLY — 118 items
ADAPTER GOLDBERG URETERAL (ADAPTER) IMPLANT
ANCHOR TIS RET SYS 1550ML (BAG) IMPLANT
BAG BILE T-TUBES STRL (MISCELLANEOUS) IMPLANT
BAG DRAIN CYSTO-URO LG1000N (MISCELLANEOUS) ×4 IMPLANT
BAG LAPAROSCOPIC 12 15 PORT 16 (BASKET) ×3 IMPLANT
BAG RETRIEVAL 12/15 (BASKET) ×4
BAG URINE DRAIN 2000ML AR STRL (UROLOGICAL SUPPLIES) IMPLANT
BAG URO DRAIN 4000ML (MISCELLANEOUS) IMPLANT
BLADE SURG SZ10 CARB STEEL (BLADE) ×4 IMPLANT
BRUSH SCRUB EZ 1% IODOPHOR (MISCELLANEOUS) ×4 IMPLANT
CANISTER SUCT 1200ML W/VALVE (MISCELLANEOUS) ×4 IMPLANT
CANNULA REDUC XI 12-8 STAPL (CANNULA) ×1
CANNULA REDUCER 12-8 DVNC XI (CANNULA) ×3 IMPLANT
CATH FOLEY 2WAY  5CC 16FR (CATHETERS)
CATH FOLEY SIL 2WAY 14FR5CC (CATHETERS) IMPLANT
CATH ROBINSON RED A/P 20FR (CATHETERS) ×4 IMPLANT
CATH URETL 5X70 OPEN END (CATHETERS) ×4 IMPLANT
CATH URTH 16FR FL 2W BLN LF (CATHETERS) IMPLANT
CHLORAPREP W/TINT 26 (MISCELLANEOUS) ×4 IMPLANT
COVER TIP SHEARS 8 DVNC (MISCELLANEOUS) ×3 IMPLANT
COVER TIP SHEARS 8MM DA VINCI (MISCELLANEOUS) ×1
COVER WAND RF STERILE (DRAPES) ×4 IMPLANT
DECANTER SPIKE VIAL GLASS SM (MISCELLANEOUS) IMPLANT
DEFOGGER SCOPE WARMER CLEARIFY (MISCELLANEOUS) ×8 IMPLANT
DERMABOND ADVANCED (GAUZE/BANDAGES/DRESSINGS) ×1
DERMABOND ADVANCED .7 DNX12 (GAUZE/BANDAGES/DRESSINGS) ×3 IMPLANT
DRAIN CHANNEL JP 15F RND 16 (MISCELLANEOUS) ×4 IMPLANT
DRAPE ARM DVNC X/XI (DISPOSABLE) ×12 IMPLANT
DRAPE COLUMN DVNC XI (DISPOSABLE) ×3 IMPLANT
DRAPE DA VINCI XI ARM (DISPOSABLE) ×4
DRAPE DA VINCI XI COLUMN (DISPOSABLE) ×1
DRAPE LEGGINS SURG 28X43 STRL (DRAPES) ×4 IMPLANT
DRAPE UNDER BUTTOCK W/FLU (DRAPES) ×4 IMPLANT
ELECT REM PT RETURN 9FT ADLT (ELECTROSURGICAL) ×4
ELECTRODE REM PT RTRN 9FT ADLT (ELECTROSURGICAL) ×3 IMPLANT
GELPOINT ADV PLATFORM (ENDOMECHANICALS)
GLOVE BIO SURGEON STRL SZ 6.5 (GLOVE) ×4 IMPLANT
GLOVE ORTHO TXT STRL SZ7.5 (GLOVE) ×16 IMPLANT
GOWN STRL REUS W/ TWL LRG LVL3 (GOWN DISPOSABLE) ×21 IMPLANT
GOWN STRL REUS W/TWL LRG LVL3 (GOWN DISPOSABLE) ×7
GRASPER SUT TROCAR 14GX15 (MISCELLANEOUS) ×4 IMPLANT
GUIDEWIRE STR DUAL SENSOR (WIRE) ×4 IMPLANT
HANDLE YANKAUER SUCT BULB TIP (MISCELLANEOUS) ×4 IMPLANT
IRRIGATION STRYKERFLOW (MISCELLANEOUS) ×3 IMPLANT
IRRIGATOR STRYKERFLOW (MISCELLANEOUS) ×4
IV NS IRRIG 3000ML ARTHROMATIC (IV SOLUTION) ×4 IMPLANT
KIT IMAGING PINPOINTPAQ (MISCELLANEOUS) ×16 IMPLANT
KIT PINK PAD W/HEAD ARE REST (MISCELLANEOUS) ×4
KIT PINK PAD W/HEAD ARM REST (MISCELLANEOUS) ×3 IMPLANT
KIT TURNOVER CYSTO (KITS) ×4 IMPLANT
KIT TURNOVER KIT A (KITS) IMPLANT
NEEDLE HYPO 22GX1.5 SAFETY (NEEDLE) ×4 IMPLANT
NEEDLE INSUFFLATION 14GA 120MM (NEEDLE) ×4 IMPLANT
NS IRRIG 500ML POUR BTL (IV SOLUTION) ×4 IMPLANT
PACK COLON CLEAN CLOSURE (MISCELLANEOUS) IMPLANT
PACK CYSTO AR (MISCELLANEOUS) ×4 IMPLANT
PACK LAP CHOLECYSTECTOMY (MISCELLANEOUS) ×4 IMPLANT
PAD PREP 24X41 OB/GYN DISP (PERSONAL CARE ITEMS) ×4 IMPLANT
PENCIL ELECTRO HAND CTR (MISCELLANEOUS) ×4 IMPLANT
PLATFORM STD W/COL CELL SVR (ENDOMECHANICALS) IMPLANT
RELOAD STAPLER 3.5X45 BLU DVNC (STAPLE) ×6 IMPLANT
RELOAD STAPLER 3.5X60 BLU DVNC (STAPLE) IMPLANT
RETRACTOR WOUND ALXS 18CM MED (MISCELLANEOUS) IMPLANT
RTRCTR WOUND ALEXIS O 18CM MED (MISCELLANEOUS)
SCISSORS METZENBAUM CVD 33 (INSTRUMENTS) IMPLANT
SEAL CANN UNIV 5-8 DVNC XI (MISCELLANEOUS) ×9 IMPLANT
SEAL XI 5MM-8MM UNIVERSAL (MISCELLANEOUS) ×3
SEALER VESSEL DA VINCI XI (MISCELLANEOUS) ×1
SEALER VESSEL EXT DVNC XI (MISCELLANEOUS) ×3 IMPLANT
SET CYSTO W/LG BORE CLAMP LF (SET/KITS/TRAYS/PACK) ×4 IMPLANT
SET TRI-LUMEN FLTR TB AIRSEAL (TUBING) ×4 IMPLANT
SOL .9 NS 3000ML IRR  AL (IV SOLUTION) ×2
SOL .9 NS 3000ML IRR UROMATIC (IV SOLUTION) ×6 IMPLANT
SOL PREP PVP 2OZ (MISCELLANEOUS) ×4
SOLUTION ELECTROLUBE (MISCELLANEOUS) ×4 IMPLANT
SOLUTION PREP PVP 2OZ (MISCELLANEOUS) ×3 IMPLANT
STAPLER 45 DA VINCI SURE FORM (STAPLE) ×1
STAPLER 45 SUREFORM DVNC (STAPLE) ×3 IMPLANT
STAPLER 60 DA VINCI SURE FORM (STAPLE)
STAPLER 60 SUREFORM DVNC (STAPLE) IMPLANT
STAPLER CANNULA SEAL DVNC XI (STAPLE) ×3 IMPLANT
STAPLER CANNULA SEAL XI (STAPLE) ×1
STAPLER CIRCULAR MANUAL XL 29 (STAPLE) ×4 IMPLANT
STAPLER RELOAD 3.5X45 BLU DVNC (STAPLE) ×6
STAPLER RELOAD 3.5X45 BLUE (STAPLE) ×2
STAPLER RELOAD 3.5X60 BLU DVNC (STAPLE)
STAPLER RELOAD 3.5X60 BLUE (STAPLE)
STAPLER SKIN PROX 35W (STAPLE) IMPLANT
STENT URET 6FRX24 CONTOUR (STENTS) IMPLANT
STENT URET 6FRX26 CONTOUR (STENTS) IMPLANT
SURGILUBE 2OZ TUBE FLIPTOP (MISCELLANEOUS) ×4 IMPLANT
SUT DVC VLOC 90 3-0 CV23 VLT (SUTURE)
SUT ETHILON 3-0 FS-10 30 BLK (SUTURE) ×4
SUT MNCRL 4-0 (SUTURE) ×2
SUT MNCRL 4-0 27XMFL (SUTURE) ×6
SUT PDS AB 0 CT1 27 (SUTURE) ×4 IMPLANT
SUT SILK 0 (SUTURE)
SUT SILK 0 30XBRD TIE 6 (SUTURE) IMPLANT
SUT V-LOC 90 ABS 3-0 VLT  V-20 (SUTURE) ×1
SUT V-LOC 90 ABS 3-0 VLT V-20 (SUTURE) ×3 IMPLANT
SUT VIC AB 0 CT1 36 (SUTURE) ×8 IMPLANT
SUT VIC AB 3-0 SH 27 (SUTURE) ×1
SUT VIC AB 3-0 SH 27X BRD (SUTURE) ×3 IMPLANT
SUTURE DVC VLC 90 3-0 CV23 VLT (SUTURE) IMPLANT
SUTURE EHLN 3-0 FS-10 30 BLK (SUTURE) ×3 IMPLANT
SUTURE MNCRL 4-0 27XMF (SUTURE) ×6 IMPLANT
SYR TOOMEY IRRIG 70ML (MISCELLANEOUS) ×4
SYRINGE IRR TOOMEY STRL 70CC (SYRINGE) ×4 IMPLANT
SYRINGE TOOMEY IRRIG 70ML (MISCELLANEOUS) ×3 IMPLANT
SYS TROCAR 1.5-3 SLV ABD GEL (ENDOMECHANICALS) ×4
SYSTEM TROCR 1.5-3 SLV ABD GEL (ENDOMECHANICALS) ×3 IMPLANT
TRAY FOLEY MTR SLVR 16FR STAT (SET/KITS/TRAYS/PACK) ×4 IMPLANT
TROCAR PORT AIRSEAL 8X100 (TROCAR) ×4 IMPLANT
TROCAR Z-THREAD FIOS 11X100 BL (TROCAR) ×4 IMPLANT
TROCAR Z-THREAD OPTICAL 5X100M (TROCAR) ×4 IMPLANT
TUBING ART PRESS 48 MALE/FEM (TUBING) IMPLANT
TUBING EVAC SMOKE HEATED PNEUM (TUBING) ×4 IMPLANT
WATER STERILE IRR 1000ML POUR (IV SOLUTION) ×4 IMPLANT

## 2019-06-29 NOTE — Transfer of Care (Signed)
Immediate Anesthesia Transfer of Care Note  Patient: Kirk Rodriguez  Procedure(s) Performed: XI ROBOTIC ASSISTED LOWER ANTERIOR RESECTION (N/A Abdomen) INDOCYANINE GREEN FLUORESCENCE IMAGING (ICG) (Bilateral Ureter) CYSTOSCOPY (N/A Bladder)  Patient Location: PACU  Anesthesia Type:General  Level of Consciousness: drowsy and patient cooperative  Airway & Oxygen Therapy: Patient Spontanous Breathing and Patient connected to face mask oxygen  Post-op Assessment: Report given to RN and Post -op Vital signs reviewed and stable  Post vital signs: Reviewed and stable  Last Vitals:  Vitals Value Taken Time  BP 147/92 06/29/19 1445  Temp 35.9 C 06/29/19 1445  Pulse 84 06/29/19 1447  Resp 10 06/29/19 1447  SpO2 100 % 06/29/19 1447  Vitals shown include unvalidated device data.  Last Pain:  Vitals:   06/29/19 0617  TempSrc: Temporal  PainSc: 0-No pain         Complications: No complications documented.

## 2019-06-29 NOTE — Interval H&P Note (Signed)
I was asked by Dr. Lutricia Feil to assist with injection of ureteral ICG into the left ureter for the purpose of identification.  Discussed risk and benefits of this procedure with the patient and consent this morning.  All questions answered.  Regular rate and rhythm Clear to auscultation bilaterally

## 2019-06-29 NOTE — Op Note (Signed)
Date of procedure: 06/29/19  Preoperative diagnosis:  1. Sigmoid colon cancer  Postoperative diagnosis:  1. Same as above  Procedure: 1. Cystoscopy 2. Ureteral injection of ICG for the purpose of ureteral identification  Surgeon: Hollice Espy, MD  Anesthesia: General  Complications: None  Intraoperative findings: Unremarkable bladder.  EBL: Minimal  Specimens: None  Drains: 16 French Foley catheter  Indication: Kirk Rodriguez is a 74 y.o. patient with sigmoid colon cancer scheduled to undergo robotic LAR with Dr Christian Mate.  I was asked to participate in surgery to inject ICG for the purpose of ureteral identification.  After reviewing the management options for treatment, he elected to proceed with the above surgical procedure(s). We have discussed the potential benefits and risks of the procedure, side effects of the proposed treatment, the likelihood of the patient achieving the goals of the procedure, and any potential problems that might occur during the procedure or recuperation. Informed consent has been obtained.  Description of procedure:  The patient was taken to the operating room and general anesthesia was induced.  The patient was placed in the dorsal lithotomy position, prepped and draped in the usual sterile fashion, and preoperative antibiotics were administered. A preoperative time-out was performed.   A 21 French scope was advanced per urethra into the bladder.  The prostate and bladder were fairly unremarkable.  Attention was turned to the left ureteral orifice which was cannulated just within the UO using a 5 [place] open-ended ureteral catheter.  10 mL of 25 mg of ICG was injected into the left ureter slowly which went quite easily.  When the cannula was removed, there was a flecks of green material from the left UO.  Attention was then turned to the right UO.  The same procedure was performed injecting 10 mL of the ICG.  At the end of the procedure, the bladder  was drained and the scope was removed.  A 16 French Foley catheter was inserted and the balloon was filled with 10 cc of sterile water.  The patient was then cleaned and dried and reprepped and draped by Dr. Christian Mate.   Hollice Espy, M.D.

## 2019-06-29 NOTE — Anesthesia Procedure Notes (Signed)
Procedure Name: Intubation Date/Time: 06/29/2019 7:41 AM Performed by: Nolon Lennert, RN Pre-anesthesia Checklist: Patient identified, Emergency Drugs available, Suction available and Patient being monitored Patient Re-evaluated:Patient Re-evaluated prior to induction Oxygen Delivery Method: Circle system utilized Preoxygenation: Pre-oxygenation with 100% oxygen Induction Type: IV induction Ventilation: Mask ventilation without difficulty and Oral airway inserted - appropriate to patient size Laryngoscope Size: Mac and 4 Grade View: Grade I Tube type: Oral Tube size: 7.5 mm Number of attempts: 1 Airway Equipment and Method: Stylet and Oral airway Placement Confirmation: ETT inserted through vocal cords under direct vision,  positive ETCO2 and breath sounds checked- equal and bilateral Secured at: 23 cm Tube secured with: Tape Dental Injury: Teeth and Oropharynx as per pre-operative assessment

## 2019-06-29 NOTE — Op Note (Signed)
Robotic assisted laparoscopic low anterior resection, with mobilization of splenic flexure.  Pre-operative Diagnosis: Rectosigmoid colon carcinoma  Post-operative Diagnosis: same.    Surgeon: Ronny Bacon, M.D., FACS  Assistant: Loel Dubonnet, PA-C  Anesthesia: General  Findings: Extensively tattooed area of peritonealized rectum, and mesorectum.  Lesion apparently within peritonealized proximal rectum.  Margins on post table evaluation, distal appear less than 3 cm.  Estimated Blood Loss: 250 mL         Specimens: Rectosigmoid colon, distal rectum.          Complications: none              Procedure Details  The patient was seen again in the Holding Room. The benefits, complications, treatment options, and expected outcomes were discussed with the patient. The risks of bleeding, infection, recurrence of symptoms, failure to resolve symptoms, unanticipated injury, prosthetic placement, prosthetic infection, any of which could require further surgery were reviewed with the patient. The likelihood of improving the patient's symptoms with return to their baseline status is fair.  The patient and/or family concurred with the proposed plan, giving informed consent.  The patient was taken to Operating Room, identified and the procedure verified.    Prior to the induction of general anesthesia, antibiotic prophylaxis was administered. VTE prophylaxis was in place.  General anesthesia was then administered and tolerated well. After the induction, the patient was positioned in the lithotomy position, Dr. Erlene Quan completed the cystoscopy with instillation of ICG and both ureters, then the abdomen perineum and buttocks were prepped with Chloraprep and Betadine as appropriate, and draped in the sterile fashion.  A Time Out was held and the above information confirmed.  After local infiltration of quarter percent Marcaine with epinephrine, stab incision was made left upper quadrant.  Just below  the costal margin approximately midclavicular line the Veress needle is passed with sensation of the layers to penetrate the abdominal wall and into the peritoneum.  Saline drop test is confirmed peritoneal placement.  Insufflation is initiated with carbon dioxide to pressures of 15 mmHg.  I then marked out my incisions for robotic surgery along a diagonal extending from the right lower quadrant to the left costal margin, adequately spaced. I placed an optical trocar in the right lower quadrant, entering under direct visualization.  There is no apparent abdominal wall studding, peritoneal fluid or significant adhesions.  I then proceeded with placement of three 8.5 mm trochars to the left and toward the left costal margin under direct visualization.  I then swapped out my optical trocar for a 12 mm robotic trocar. We then placed the patient in steep 30 degrees Trendelenburg with 10 degree of right side down.  There is no need to mobilize any small bowel out of the pelvis, so I proceeded with docking the robot. With a tips up fenestrated grasper in one, force bipolar in 2 and monopolar scissors in 4.  I initiated the retroperitoneal dissection at the perceived junction of the mesorectum and retroperitoneum, then proceeded with blunt dissection laterally and cephalad to skeletonized the IMA. I divided the IMA at its takeoff from the aorta utilizing vessel sealer, sealing it four times prior to its division.  Then proceeded with dissection between the mesocolon and the retroperitoneum from medial to lateral as far as I could proceed.  Both right and left ureter were glowing green utilizing firefly throughout the procedure which aided significantly in the dissection.  I dissected this far as I could laterally I then proceeded with  division of the lateral peritoneal attachments from the sigmoid all the way up to the splenic and through the splenic flexure. This created excellent mobilization of the entire left  colon.  Then proceeded with the TME dissection specific for the the proximal rectal tumor.  This was done primarily with blunt dissection and countertraction maintaining hemostasis with the vessel sealer and the bipolar. I then chose a point approximately mid rectum for division where I approached the rectum utilizing the vessel sealer to divide the rectal vasculature, maintaining an adequate envelope of the mesorectum circumferentially. I then proceeded with dividing the rectum utilizing a 45 mm blue cartridge of the robotic stapler. This left an excellent staple line and complete transverse division of the mid rectum.  I then selected a point proximal where it would reach well into the pelvis, without tension.  I then divided the mesocolon with the vessel sealer to that point, utilize firefly to ensure a point of division would be well vascularized which it did.  Then divided the proximal rectum with another staple cartridge of the 45 mm blue load. I then placed the specimen in the the right upper quadrant.  I then scrubbed back in two create a Pfannenstiel incision on the left of the midline the lower abdomen.  The Alexis retractor/GelPort was placed to maintain pneumoperitoneum. I then excised the staple line by cutting it simply with the vessel sealer device, placed a pursestring suture of three OV lock, and having passed the anvil and via the gel point, I then secured it within the distal colon.  Pared the serosa for EEA anastomosis.  We utilized a twenty-nine EEA stapler through the rectum and created the end anastomosis with only one staple line intersection, by cheating to the patient's left.  I ensured there were no twists or kinks in the colon prior to creating this anastomosis.  The anastomosis was then tested for air leak, two donuts were found intact on the EEA. We then undocked the robot.  Extracted the specimen out of the left lower quadrant Pfannenstiel incision.  We then proceeded with  irrigation and aspiration of the abdominal cavity, spent a significant amount of time at least over an hour reassuring that we had adequate hemostasis, only to discover that there was a muscular bleeder from the Pfannenstiel site. We then desufflated the abdomen and removed all of laparoscopic trochars having already placed a nineteen Blake drain in the pelvis to assure that we would not collect a hematoma there. We then closed the Pfannenstiel incision fascia with 0 PDS running.  Then closed the 12 mm trocar with a single suture of 0 Vicryl, secured the drain with drain stitch and then closed all the skin with subcuticulars of 4-0 Monocryl, sealing the skin with Dermabond. Overall the patient appeared to tolerate procedure well. Needle sponge instrument count reported correct.  He subsequently taken out of lithotomy position and transferred recovery room in stable condition.   Ronny Bacon M.D., [facility] 06/29/2019 2:33 PM

## 2019-06-29 NOTE — Anesthesia Preprocedure Evaluation (Signed)
Anesthesia Evaluation  Patient identified by MRN, date of birth, ID band Patient awake    Reviewed: Allergy & Precautions, NPO status , Patient's Chart, lab work & pertinent test results  History of Anesthesia Complications Negative for: history of anesthetic complications  Airway Mallampati: II  TM Distance: >3 FB Neck ROM: Full    Dental  (+) Upper Dentures, Partial Lower   Pulmonary neg sleep apnea, COPD, former smoker,    breath sounds clear to auscultation- rhonchi (-) wheezing      Cardiovascular hypertension, (-) CAD, (-) Past MI, (-) Cardiac Stents and (-) CABG  Rhythm:Regular Rate:Normal - Systolic murmurs and - Diastolic murmurs    Neuro/Psych  Headaches, neg Seizures negative psych ROS   GI/Hepatic Neg liver ROS, GERD  ,  Endo/Other  negative endocrine ROSneg diabetes  Renal/GU negative Renal ROS     Musculoskeletal negative musculoskeletal ROS (+)   Abdominal (+) - obese,   Peds  Hematology negative hematology ROS (+)   Anesthesia Other Findings Past Medical History: No date: Allergy No date: Cancer [facility])     Comment:  Larynx--carcinoma in situ No date: COPD (chronic obstructive pulmonary disease) (HCC)     Comment:  no inhalers No date: GERD (gastroesophageal reflux disease)     Comment:  no meds No date: Headache     Comment:  h/o migraines No date: Heart murmur     Comment:  as a child No date: Hyperlipidemia No date: Wears dentures     Comment:  full upper, partial lower   Reproductive/Obstetrics                             Anesthesia Physical Anesthesia Plan  ASA: II  Anesthesia Plan: General   Post-op Pain Management:    Induction: Intravenous  PONV Risk Score and Plan: 1 and Ondansetron and Dexamethasone  Airway Management Planned: Oral ETT  Additional Equipment:   Intra-op Plan:   Post-operative Plan: Extubation in OR  Informed Consent: I have  reviewed the patients History and Physical, chart, labs and discussed the procedure including the risks, benefits and alternatives for the proposed anesthesia with the patient or authorized representative who has indicated his/her understanding and acceptance.     Dental advisory given  Plan Discussed with: CRNA and Anesthesiologist  Anesthesia Plan Comments:         Anesthesia Quick Evaluation

## 2019-06-29 NOTE — Interval H&P Note (Signed)
History and Physical Interval Note:  06/29/2019 7:26 AM  Kirk Rodriguez  has presented today for surgery, with the diagnosis of Sigmoid colon cancer.  The various methods of treatment have been discussed with the patient and family. After consideration of risks, benefits and other options for treatment, the patient has consented to  Procedure(s): XI ROBOTIC ASSISTED LOWER ANTERIOR RESECTION (N/A) INDOCYANINE GREEN FLUORESCENCE IMAGING (ICG) (Left) as a surgical intervention.  The patient's history has been reviewed, patient examined, no change in status, stable for surgery.  I have reviewed the patient's chart and labs.  Questions were answered to the patient's satisfaction.     Ronny Bacon, M.D., [facility]  06/29/2019 ; 7:26 AM

## 2019-06-29 NOTE — Anesthesia Postprocedure Evaluation (Signed)
Anesthesia Post Note  Patient: Kirk Rodriguez  Procedure(s) Performed: XI ROBOTIC ASSISTED LOWER ANTERIOR RESECTION (N/A Abdomen) INDOCYANINE GREEN FLUORESCENCE IMAGING (ICG) (Bilateral Ureter) CYSTOSCOPY (N/A Bladder)  Patient location during evaluation: PACU Anesthesia Type: General Level of consciousness: awake and alert and oriented Pain management: pain level controlled Vital Signs Assessment: post-procedure vital signs reviewed and stable Respiratory status: spontaneous breathing Cardiovascular status: blood pressure returned to baseline Anesthetic complications: no   No complications documented.   Last Vitals:  Vitals:   06/29/19 1500 06/29/19 1513  BP: (!) 112/40 136/82  Pulse: 84 77  Resp: 12 (!) 7  Temp:    SpO2: 99% 95%    Last Pain:  Vitals:   06/29/19 1513  TempSrc:   PainSc: Asleep                 Sharvil Hoey

## 2019-06-30 ENCOUNTER — Encounter: Payer: Self-pay | Admitting: Surgery

## 2019-06-30 LAB — BASIC METABOLIC PANEL
Anion gap: 9 (ref 5–15)
BUN: 18 mg/dL (ref 8–23)
CO2: 22 mmol/L (ref 22–32)
Calcium: 8.2 mg/dL — ABNORMAL LOW (ref 8.9–10.3)
Chloride: 105 mmol/L (ref 98–111)
Creatinine, Ser: 1.23 mg/dL (ref 0.61–1.24)
GFR calc Af Amer: >60 mL/min (ref >60)
GFR calc non Af Amer: 58 mL/min — ABNORMAL LOW (ref >60)
Glucose, Bld: 115 mg/dL — ABNORMAL HIGH (ref 70–99)
Potassium: 4.7 mmol/L (ref 3.5–5.1)
Sodium: 136 mmol/L (ref 135–145)

## 2019-06-30 LAB — CBC
HCT: 29.6 % — ABNORMAL LOW (ref 39.0–52.0)
Hemoglobin: 10.2 g/dL — ABNORMAL LOW (ref 13.0–17.0)
MCH: 30.5 pg (ref 26.0–34.0)
MCHC: 34.5 g/dL (ref 30.0–36.0)
MCV: 88.6 fL (ref 80.0–100.0)
Platelets: 204 10*3/uL (ref 150–400)
RBC: 3.34 MIL/uL — ABNORMAL LOW (ref 4.22–5.81)
RDW: 13.8 % (ref 11.5–15.5)
WBC: 14.3 10*3/uL — ABNORMAL HIGH (ref 4.0–10.5)
nRBC: 0 % (ref 0.0–0.2)

## 2019-06-30 MED ORDER — CHLORHEXIDINE GLUCONATE CLOTH 2 % EX PADS
6.0000 | MEDICATED_PAD | Freq: Every day | CUTANEOUS | Status: DC
  Administered 2019-06-30: 6 via TOPICAL

## 2019-06-30 NOTE — Progress Notes (Signed)
[facility] Day(s): 1.   Post op day(s): 1 Day Post-Op.   Interval History:  Patient seen and examined no acute events or new complaints overnight. Patient reports he is doing very well No complaints of abdominal pain, nausea, emesis, fever, chills He is up moving around the room this morning already Mild leukocytosis to 14.3K; likely reactive, no fevers Hgb did drop to 10.7, suspect dilutional  Renal function is normal, sCr - 1.23, good UO - 2.0L Surgical drain with 420 ccs, serosanguinous On CLD, + flatus multiple times Mobilizing very well  Vital signs in last 24 hours: [min-max] current  Temp:  [96.6 F (35.9 C)-98.6 F (37 C)] 98.5 F (36.9 C) (06/24 0424) Pulse Rate:  [66-86] 66 (06/24 0424) Resp:  [7-18] 16 (06/24 0424) BP: (102-147)/(40-92) 103/67 (06/24 0424) SpO2:  [95 %-100 %] 96 % (06/24 0424)     Height: 6\' 1"  (185.4 cm) Weight: 88.5 kg BMI (Calculated): 25.73   Intake/Output last 2 shifts:  06/23 0701 - 06/24 0700 In: 6400 [P.O.:1100; I.V.:5100; IV Piggyback:200] Out: 6270 [Urine:2000; Drains:420; Blood:50]   Physical Exam:  Constitutional: alert, cooperative and no distress  Respiratory: breathing non-labored at rest  Cardiovascular: regular rate and sinus rhythm  Gastrointestinal: Soft, non-tender, and non-distended, no rebound/guarding, JP in RLQ with serosanguinous output Genitourinary: Foley in place, no hematuria Integumentary: Laparoscopic incisions are CDI with dermabond, some ecchymosis, no erythema or drainage  Labs:  CBC Latest Ref Rng & Units 06/30/2019 06/29/2019 06/13/2019  WBC 4.0 - 10.5 K/uL 14.3(H) - 10.0  Hemoglobin 13.0 - 17.0 g/dL 10.2(L) 13.1 13.7  Hematocrit 39 - 52 % 29.6(L) 39.2 40.7  Platelets 150 - 400 K/uL 204 - 283   CMP Latest Ref Rng & Units 06/30/2019 06/13/2019 04/29/2016  Glucose 70 - 99 mg/dL 115(H) 105(H) -  BUN 8 - 23 mg/dL 18 22 -  Creatinine 0.61 - 1.24 mg/dL 1.23 1.16  1.10  Sodium 135 - 145 mmol/L 136 138 -  Potassium 3.5 - 5.1 mmol/L 4.7 3.9 -  Chloride 98 - 111 mmol/L 105 104 -  CO2 22 - 32 mmol/L 22 25 -  Calcium 8.9 - 10.3 mg/dL 8.2(L) 9.2 -  Total Protein 6.5 - 8.1 g/dL - 7.2 -  Total Bilirubin 0.3 - 1.2 mg/dL - 0.7 -  Alkaline Phos 38 - 126 U/L - 56 -  AST 15 - 41 U/L - 15 -  ALT 0 - 44 U/L - 20 -     Imaging studies: No new pertinent imaging studies   Assessment/Plan: 74 y.o. male overall doing very well with return of bowel fucntion 1 Day Post-Op s/p robotic assisted laparoscopic LAR for rectosigmoid carcinoma.   - Advance to full liquids this morning; if doing well we can consider soft diet for dinner   - Wean IVF  - Pain control prn; antiemetics prn  - Monitor abdominal examination; on-going bowel function  - Discontinue foley  - Discontinue Entereg   - Continue JP drain; monitor and record output   - Morning labs: CBC, BMP  - Medical management of comorbid conditions; home medications   - DVT Prophylaxis; hold today, restart tomorrow if no concerns for bleeding    All of the above findings and recommendations were discussed with the patient, and the medical team, and all of patient's questions were answered to his expressed satisfaction.  -- Edison Simon, PA-C [facility] 06/30/2019, 8:45 AM [phone] M-F: 7am - 4pm

## 2019-07-01 LAB — CBC
HCT: 33.2 % — ABNORMAL LOW (ref 39.0–52.0)
Hemoglobin: 11.6 g/dL — ABNORMAL LOW (ref 13.0–17.0)
MCH: 30.4 pg (ref 26.0–34.0)
MCHC: 34.9 g/dL (ref 30.0–36.0)
MCV: 87.1 fL (ref 80.0–100.0)
Platelets: 227 10*3/uL (ref 150–400)
RBC: 3.81 MIL/uL — ABNORMAL LOW (ref 4.22–5.81)
RDW: 14.1 % (ref 11.5–15.5)
WBC: 13.7 10*3/uL — ABNORMAL HIGH (ref 4.0–10.5)
nRBC: 0 % (ref 0.0–0.2)

## 2019-07-01 LAB — BASIC METABOLIC PANEL
Anion gap: 9 (ref 5–15)
BUN: 25 mg/dL — ABNORMAL HIGH (ref 8–23)
CO2: 25 mmol/L (ref 22–32)
Calcium: 8.8 mg/dL — ABNORMAL LOW (ref 8.9–10.3)
Chloride: 107 mmol/L (ref 98–111)
Creatinine, Ser: 1.2 mg/dL (ref 0.61–1.24)
GFR calc Af Amer: >60 mL/min (ref >60)
GFR calc non Af Amer: 60 mL/min — ABNORMAL LOW (ref >60)
Glucose, Bld: 113 mg/dL — ABNORMAL HIGH (ref 70–99)
Potassium: 4.1 mmol/L (ref 3.5–5.1)
Sodium: 141 mmol/L (ref 135–145)

## 2019-07-01 NOTE — Discharge Summary (Signed)
[facility] SURGICAL DISCHARGE SUMMARY  Patient ID: Kirk Rodriguez MRN: [id number] DOB/AGE: [birth year] 74 y.o.  Admit date: 06/29/2019 Discharge date: 07/01/2019  Discharge Diagnoses Patient Active Problem List   Diagnosis Date Noted  . Adenocarcinoma of sigmoid colon (Kirk Rodriguez) 06/29/2019  . Polyp of colon   . Colon neoplasm     Consultants None  Procedures 06/29/2019:  Robotic assisted laparoscopic low anterior resection, with mobilization of splenic flexure.  06/29/2019:  Cystoscopy, Ureteral injection of ICG for the purpose of ureteral identification   HPI: Kirk Rodriguez is a 74 y.o. male with a history of colorectal adenocarcinoma 15 cm from anal verge who presents to [facility] on 06/23 for scheduled robotic assisted laparoscopic sigmoid colectomy with Dr Christian Mate.   Hospital Course: Informed consent was obtained and documented, and patient underwent uneventful  robotic assisted laparoscopic sigmoid colectomy (Dr Christian Mate, 06/29/2019).  Post-operatively, patient did very well and advancement of patient's diet and ambulation were well-tolerated. The remainder of patient's hospital course was essentially unremarkable, and discharge planning was initiated accordingly with patient safely able to be discharged home with appropriate discharge instructions, pain control, and outpatient follow-up after all of his questions were answered to his expressed satisfaction.  I offered narcotic pain medications however he declined. He notes that in the past tylenol +/- ibuprofen in the past have been satisfactory. He has required very minimal pain control here.    Discharge Condition: Good   Physical Examination:  Constitutional: alert, cooperative and no distress  Respiratory: breathing non-labored at rest  Cardiovascular: regular rate and sinus rhythm  Gastrointestinal: Soft, non-tender, and non-distended, no rebound/guarding, JP in RLQ with serosanguinous  output Integumentary: Laparoscopic incisions are CDI with dermabond, some ecchymosis primarily around Pfannenstiel incision, no erythema or drainage   Allergies as of 07/01/2019   No Known Allergies     Medication List    STOP taking these medications   bisacodyl 5 MG EC tablet Commonly known as: DULCOLAX   erythromycin base 500 MG tablet Commonly known as: E-MYCIN   neomycin 500 MG tablet Commonly known as: MYCIFRADIN   polyethylene glycol powder 17 GM/SCOOP powder Commonly known as: MiraLax     TAKE these medications   acetaminophen 500 MG tablet Commonly known as: TYLENOL Take 500 mg by mouth every 6 (six) hours as needed (pain.).   Aleve PM 220-25 MG Tabs Generic drug: Naproxen Sod-diphenhydrAMINE Take 0.5 mg by mouth at bedtime as needed (sleep.).   aspirin EC 81 MG tablet Take 81 mg by mouth daily.   beta carotene w/minerals tablet Take 1 tablet by mouth at bedtime.   BLUE-EMU SUPER STRENGTH EX Apply 1 application topically 2 (two) times daily as needed (applied to right hand as needed for soreness/pain.).   diphenhydramine-acetaminophen 25-500 MG Tabs tablet Commonly known as: TYLENOL PM Take 0.5 tablets by mouth at bedtime.   multivitamin with minerals Tabs tablet Take 1 tablet by mouth daily.   Nasacort AQ 55 MCG/ACT Aero nasal inhaler Generic drug: triamcinolone Place 1 spray into the nose daily.   OSTEO BI-FLEX TRIPLE STRENGTH PO Take 1 tablet by mouth in the morning and at bedtime.   simvastatin 80 MG tablet Commonly known as: ZOCOR Take 80 mg by mouth at bedtime.   Vitamin D3 50 MCG (2000 UT) capsule Take 2,000 Units by mouth daily.         Follow-up Information    Ronny Bacon, MD. Schedule an appointment as soon as possible for a visit on 07/05/2019.  Specialty: General Surgery Why: s/p robotic colectomy, has drain, can see Thedore Mins PA if needed Contact information: [address] [place]  [phone]                Time spent on discharge management including discussion of hospital course, clinical condition, outpatient instructions, prescriptions, and follow up with the patient and members of the medical team: >30 minutes  -- Edison Simon , PA-C [facility]  07/01/2019, 8:39 AM [phone] M-F: 7am - 4pm

## 2019-07-01 NOTE — Progress Notes (Signed)
Patient being transferred to 1A rm 146. Report will be given to nurse at bedside. Kirk Rodriguez

## 2019-07-01 NOTE — Care Management Important Message (Signed)
Important Message  Patient Details  Name: Kirk Rodriguez MRN: [id number] Date of Birth: 06-Dec-1945   Medicare Important Message Given:  N/A - LOS <3 / Initial given by admissions     [name] 07/01/2019, 8:02 AM

## 2019-07-05 ENCOUNTER — Encounter: Payer: Medicare PPO | Admitting: Physician Assistant

## 2019-07-13 ENCOUNTER — Encounter: Payer: Self-pay | Admitting: Physician Assistant

## 2019-07-13 ENCOUNTER — Other Ambulatory Visit: Payer: Self-pay

## 2019-07-13 ENCOUNTER — Ambulatory Visit (INDEPENDENT_AMBULATORY_CARE_PROVIDER_SITE_OTHER): Payer: Self-pay | Admitting: Physician Assistant

## 2019-07-13 VITALS — BP 127/80 | HR 71 | Temp 97.7°F | Resp 12 | Ht 73.0 in | Wt 189.0 lb

## 2019-07-13 DIAGNOSIS — C187 Malignant neoplasm of sigmoid colon: Secondary | ICD-10-CM

## 2019-07-13 DIAGNOSIS — C19 Malignant neoplasm of rectosigmoid junction: Secondary | ICD-10-CM

## 2019-07-13 DIAGNOSIS — Z09 Encounter for follow-up examination after completed treatment for conditions other than malignant neoplasm: Secondary | ICD-10-CM

## 2019-07-13 LAB — SURGICAL PATHOLOGY

## 2019-07-13 NOTE — Patient Instructions (Addendum)
Please call our office if you have questions or concerns. You may resume playing golf 4 weeks from today.   GENERAL POST-OPERATIVE PATIENT INSTRUCTIONS   WOUND CARE INSTRUCTIONS:  Keep a dry clean dressing on the wound if there is drainage. The initial bandage may be removed after 24 hours.  Once the wound has quit draining you may leave it open to air.  If clothing rubs against the wound or causes irritation and the wound is not draining you may cover it with a dry dressing during the daytime.  Try to keep the wound dry and avoid ointments on the wound unless directed to do so.  If the wound becomes bright red and painful or starts to drain infected material that is not clear, please contact your physician immediately.  If the wound is mildly pink and has a thick firm ridge underneath it, this is normal, and is referred to as a healing ridge.  This will resolve over the next 4-6 weeks.  BATHING: You may shower if you have been informed of this by your surgeon. However, Please do not submerge in a tub, hot tub, or pool until incisions are completely sealed or have been told by your surgeon that you may do so.  DIET:  You may eat any foods that you can tolerate.  It is a good idea to eat a high fiber diet and take in plenty of fluids to prevent constipation.  If you do become constipated you may want to take a mild laxative or take ducolax tablets on a daily basis until your bowel habits are regular.  Constipation can be very uncomfortable, along with straining, after recent surgery.  ACTIVITY:  You are encouraged to cough and deep breath or use your incentive spirometer if you were given one, every 15-30 minutes when awake.  This will help prevent respiratory complications and low grade fevers post-operatively if you had a general anesthetic.  You may want to hug a pillow when coughing and sneezing to add additional support to the surgical area, if you had abdominal or chest surgery, which will decrease  pain during these times.  You are encouraged to walk and engage in light activity for the next two weeks.  You should not lift more than 20 pounds, until 08/10/19 as it could put you at increased risk for complications.  Twenty pounds is roughly equivalent to a plastic bag of groceries. At that time- Listen to your body when lifting, if you have pain when lifting, stop and then try again in a few days. Soreness after doing exercises or activities of daily living is normal as you get back in to your normal routine.  MEDICATIONS:  Try to take narcotic medications and anti-inflammatory medications, such as tylenol, ibuprofen, naprosyn, etc., with food.  This will minimize stomach upset from the medication.  Should you develop nausea and vomiting from the pain medication, or develop a rash, please discontinue the medication and contact your physician.  You should not drive, make important decisions, or operate machinery when taking narcotic pain medication.  SUNBLOCK Use sun block to incision area over the next year if this area will be exposed to sun. This helps decrease scarring and will allow you avoid a permanent darkened area over your incision.  QUESTIONS:  Please feel free to call our office if you have any questions, and we will be glad to assist you. [phone]

## 2019-07-13 NOTE — Progress Notes (Addendum)
[facility] POST-OP OFFICE VISIT  07/13/2019  HPI: Kirk Rodriguez is a 74 y.o. male 14 days s/p robotic assisted laparoscopic LAR for rectosigmoid carcinoma with Dr Christian Mate.   Doing very well No issues with pain No fever, chills, nausea, or emesis He did endorse some drainage from his Pfannenstiel incision, serosanguinous No issues with PO intake, + bowel function Mobilizing well   Reviewed Surgical Pathology:  A. COLON, PROXIMAL RECTOSIGMOID; LOW ANTERIOR RESECTION:  - INVASIVE COLORECTAL ADENOCARCINOMA, MODERATELY DIFFERENTIATED.  - SIXTEEN LYMPH NODES, NEGATIVE FOR METASTATIC CARCINOMA (0/16).  - SEE CANCER SUMMARY BELOW.   B. MESORECTUM, ADDITIONAL; EXCISION:  - ADIPOSE TISSUE, NEGATIVE FOR MALIGNANCY.   C. "DONUT RINGS"; EXCISION:  - SEGMENTS OF UNREMARKABLE COLON.  - NEGATIVE FOR MALIGNANCY.    Vital signs: BP 127/80   Pulse 71   Temp 97.7 F (36.5 C) (Oral)   Resp 12   Ht 6\' 1"  (1.854 m)   Wt 189 lb (85.7 kg)   SpO2 98%   BMI 24.94 kg/m    Physical Exam: Constitutional: Well appearing male, NAD Abdomen: Soft, non-tender, non-distended, no rebound/guarding Skin: Laparoscopic incisions are healing well, some ecchymosis, no erythema. His Pfannenstiel incision does have some drainage to the medial portion, I was able to burn this with silver nitrate to control drainage   Assessment/Plan: This is a 74 y.o. male 14 days s/p robotic assisted laparoscopic LAR for rectosigmoid carcinoma   - Reviewed pathology; follow up with Oncology as scheduled  - Pain control prn  - Reviewed wound care  - Reviewed lifting restriction  - rtc in 2 weeks for recheck   -- Edison Simon, PA-C [facility] 07/13/2019, 11:25 AM [phone] M-F: 7am - 4pm

## 2019-07-21 ENCOUNTER — Other Ambulatory Visit: Payer: Self-pay

## 2019-07-21 ENCOUNTER — Inpatient Hospital Stay: Payer: Medicare PPO | Attending: Oncology | Admitting: Oncology

## 2019-07-21 ENCOUNTER — Encounter: Payer: Self-pay | Admitting: Oncology

## 2019-07-21 VITALS — BP 127/85 | HR 69 | Temp 98.5°F | Resp 16 | Wt 191.0 lb

## 2019-07-21 DIAGNOSIS — Z9049 Acquired absence of other specified parts of digestive tract: Secondary | ICD-10-CM | POA: Diagnosis not present

## 2019-07-21 DIAGNOSIS — C19 Malignant neoplasm of rectosigmoid junction: Secondary | ICD-10-CM | POA: Diagnosis present

## 2019-07-21 DIAGNOSIS — Z87891 Personal history of nicotine dependence: Secondary | ICD-10-CM | POA: Insufficient documentation

## 2019-07-21 DIAGNOSIS — R918 Other nonspecific abnormal finding of lung field: Secondary | ICD-10-CM | POA: Insufficient documentation

## 2019-07-21 DIAGNOSIS — C187 Malignant neoplasm of sigmoid colon: Secondary | ICD-10-CM | POA: Diagnosis not present

## 2019-07-21 DIAGNOSIS — Z7189 Other specified counseling: Secondary | ICD-10-CM

## 2019-07-21 NOTE — Progress Notes (Signed)
Hematology/Oncology Consult note [facility]  Telephone:([phone] [phone]  Patient Care Team: Barbaraann Boys, MD as PCP - General (Family Medicine) Clent Jacks, RN as Oncology Nurse Navigator   Name of the patient: Kirk Rodriguez  [id number]  1945-06-02   Date of visit: 07/21/19  Diagnosis-stage I adenocarcinoma of sigmoid/rectum s/p low anterior resection  Chief complaint/ Reason for visit-discuss final pathology results and further management  Heme/Onc history: patient is a 74 year old male who was seen by Dr. Bonna Gains from GI for positive fit test.He underwent a colonoscopy on 06/02/2019 which showed 6 sessile polyps in the transverse colon ascending colon and cecum. 2 sessile semipedunculated nonbleeding polyps in the sigmoid colon and descending colon. An ulcerating nonobstructing large mass 15 cm proximal to the anus. Mass was noncircumferential. No bleeding was present. Right and left colon polyps pathology was consistent with a tubular adenoma negative for high-grade dysplasia and malignancy. Colorectal mass positive for invasive colon adenocarcinoma. Sigmoid colon polyp tubulovillous adenoma. Negative for high-grade dysplasia.  CT chest abdomen pelvis did not show any findings of metastatic disease.He was noted to have small scattered bilateral pulmonary nodules which were unchanged from prior exams and possibly benign.   Patient underwent low anterior resection With Dr. Christian Mate on 06/29/2019.  Final pathology showed invasive colorectal adenocarcinoma moderately differentiated with negative margins.  2.3 x 1.7 x 0.7 cm.  No lymphovascular invasion.  16 lymph nodes negative for malignancy.  PT2PN0  Interval history-patient is doing well post surgery.  Bowel movements are regular.  Denies any abdominal pain  ECOG PS- 1 Pain scale- 0   Review of systems- Review of Systems  Constitutional: Negative for chills, fever,  malaise/fatigue and weight loss.  HENT: Negative for congestion, ear discharge and nosebleeds.   Eyes: Negative for blurred vision.  Respiratory: Negative for cough, hemoptysis, sputum production, shortness of breath and wheezing.   Cardiovascular: Negative for chest pain, palpitations, orthopnea and claudication.  Gastrointestinal: Negative for abdominal pain, blood in stool, constipation, diarrhea, heartburn, melena, nausea and vomiting.  Genitourinary: Negative for dysuria, flank pain, frequency, hematuria and urgency.  Musculoskeletal: Negative for back pain, joint pain and myalgias.  Skin: Negative for rash.  Neurological: Negative for dizziness, tingling, focal weakness, seizures, weakness and headaches.  Endo/Heme/Allergies: Does not bruise/bleed easily.  Psychiatric/Behavioral: Negative for depression and suicidal ideas. The patient does not have insomnia.       No Known Allergies   Past Medical History:  Diagnosis Date  . Allergy   . Cancer ([place])    Larynx--carcinoma in situ  . Colon cancer ([place])   . COPD (chronic obstructive pulmonary disease) (HCC)    no inhalers  . GERD (gastroesophageal reflux disease)    no meds  . Headache    h/o migraines  . Heart murmur    as a child  . Hyperlipidemia   . Wears dentures    full upper, partial lower     Past Surgical History:  Procedure Laterality Date  . COLONOSCOPY WITH PROPOFOL N/A 06/02/2019   Procedure: COLONOSCOPY WITH PROPOFOL;  Surgeon: Virgel Manifold, MD;  Location: [facility];  Service: Endoscopy;  Laterality: N/A;  . CYSTOSCOPY N/A 06/29/2019   Procedure: CYSTOSCOPY;  Surgeon: Hollice Espy, MD;  Location: [facility];  Service: Urology;  Laterality: N/A;  . DIRECT LARYNGOSCOPY Right 04/03/2016   Procedure: DIRECT MICROLARYNGOSCOPY WITH EXCISON RIGHT VOCAL CORD LESION;  Surgeon: Margaretha Sheffield, MD;  Location: [place];  Service: ENT;  Laterality: Right;  RIGHT   . INGUINAL HERNIA REPAIR Bilateral  12/18/2015   Procedure: LAPAROSCOPIC BILATERAL INGUINAL HERNIA REPAIR;  Surgeon: Hubbard Robinson, MD;  Location: [facility];  Service: General;  Laterality: Bilateral;  . MINOR EXCISION OF ORAL LESION Left 03/06/2016   Procedure: direct microlaryngoscopy with excision left vocal cord lesion;  Surgeon: Margaretha Sheffield, MD;  Location: [place];  Service: ENT;  Laterality: Left;  left vocal cord lesion  . TRIGGER FINGER RELEASE Left 2016  . VASECTOMY Bilateral 1978  . XI ROBOTIC ASSISTED LOWER ANTERIOR RESECTION N/A 06/29/2019   Procedure: XI ROBOTIC ASSISTED LOWER ANTERIOR RESECTION;  Surgeon: Ronny Bacon, MD;  Location: [facility];  Service: General;  Laterality: N/A;    Social History   Socioeconomic History  . Marital status: Married    Spouse name: Not on file  . Number of children: Not on file  . Years of education: Not on file  . Highest education level: Not on file  Occupational History  . Not on file  Tobacco Use  . Smoking status: Former Smoker    Packs/day: 1.50    Years: 53.00    Pack years: 79.50    Types: Cigarettes    Quit date: 04/18/2015    Years since quitting: 4.2  . Smokeless tobacco: Former Network engineer  . Vaping Use: Never used  Substance and Sexual Activity  . Alcohol use: Not Currently  . Drug use: No  . Sexual activity: Not Currently  Other Topics Concern  . Not on file  Social History Narrative  . Not on file   Social Determinants of Health   Financial Resource Strain:   . Difficulty of Paying Living Expenses:   Food Insecurity:   . Worried About Charity fundraiser in the Last Year:   . Arboriculturist in the Last Year:   Transportation Needs:   . Film/video editor (Medical):   Marland Kitchen Lack of Transportation (Non-Medical):   Physical Activity:   . Days of Exercise per Week:   . Minutes of Exercise per Session:   Stress:   . Feeling of Stress :   Social Connections:   . Frequency of Communication with Friends and Family:   .  Frequency of Social Gatherings with Friends and Family:   . Attends Religious Services:   . Active Member of Clubs or Organizations:   . Attends Archivist Meetings:   Marland Kitchen Marital Status:   Intimate Partner Violence:   . Fear of Current or Ex-Partner:   . Emotionally Abused:   Marland Kitchen Physically Abused:   . Sexually Abused:     Family History  Problem Relation Age of Onset  . Lung cancer Father   . Heart disease Father   . Hypertension Father   . Polycythemia Mother   . Heart disease Mother      Current Outpatient Medications:  .  acetaminophen (TYLENOL) 500 MG tablet, Take 500 mg by mouth every 6 (six) hours as needed (pain.). , Disp: , Rfl:  .  aspirin EC 81 MG tablet, Take 81 mg by mouth daily. , Disp: , Rfl:  .  beta carotene w/minerals (OCUVITE) tablet, Take 1 tablet by mouth at bedtime., Disp: , Rfl:  .  Cholecalciferol (VITAMIN D3) 2000 units capsule, Take 2,000 Units by mouth daily. , Disp: , Rfl:  .  diphenhydramine-acetaminophen (TYLENOL PM) 25-500 MG TABS tablet, Take 0.5 tablets by mouth at bedtime., Disp: , Rfl:  .  Liniments (BLUE-EMU SUPER STRENGTH EX), Apply 1 application topically 2 (two) times daily as needed (applied to right hand as needed for soreness/pain.)., Disp: , Rfl:  .  Misc Natural Products (OSTEO BI-FLEX TRIPLE STRENGTH PO), Take 1 tablet by mouth in the morning and at bedtime., Disp: , Rfl:  .  Multiple Vitamin (MULTIVITAMIN WITH MINERALS) TABS tablet, Take 1 tablet by mouth daily., Disp: , Rfl:  .  simvastatin (ZOCOR) 80 MG tablet, Take 80 mg by mouth at bedtime. , Disp: , Rfl:  .  triamcinolone (NASACORT AQ) 55 MCG/ACT AERO nasal inhaler, Place 1 spray into the nose daily. , Disp: , Rfl:   Physical exam:  Vitals:   07/21/19 1026  BP: 127/85  Pulse: 69  Resp: 16  Temp: 98.5 F (36.9 C)  TempSrc: Oral  Weight: 191 lb (86.6 kg)   Physical Exam Constitutional:      General: He is not in acute distress. Pulmonary:     Effort: Pulmonary  effort is normal.  Abdominal:     General: Bowel sounds are normal.     Palpations: Abdomen is soft.     Comments: With recent scar from laparoscopy surgery healing well.  Skin:    General: Skin is warm and dry.  Neurological:     Mental Status: He is alert and oriented to person, place, and time.      CMP Latest Ref Rng & Units 07/01/2019  Glucose 70 - 99 mg/dL 113(H)  BUN 8 - 23 mg/dL 25(H)  Creatinine 0.61 - 1.24 mg/dL 1.20  Sodium 135 - 145 mmol/L 141  Potassium 3.5 - 5.1 mmol/L 4.1  Chloride 98 - 111 mmol/L 107  CO2 22 - 32 mmol/L 25  Calcium 8.9 - 10.3 mg/dL 8.8(L)  Total Protein 6.5 - 8.1 g/dL -  Total Bilirubin 0.3 - 1.2 mg/dL -  Alkaline Phos 38 - 126 U/L -  AST 15 - 41 U/L -  ALT 0 - 44 U/L -   CBC Latest Ref Rng & Units 07/01/2019  WBC 4.0 - 10.5 K/uL 13.7(H)  Hemoglobin 13.0 - 17.0 g/dL 11.6(L)  Hematocrit 39 - 52 % 33.2(L)  Platelets 150 - 400 K/uL 227      Assessment and plan- Patient is a 74 y.o. male with stage I moderately differentiated adenocarcinoma of the sigmoid colon/rectum stage I T2 N0 M0 s/p resection here to discuss final pathology results and further management  Patient underwent a low anterior resection of the colon/rectal mass and overall this was stage I.  Even if this was a rectal mass for a T2 N0 M0 he does not require any adjuvant radiation treatment or chemotherapy.  For stage I disease per NCCN guidelines surveillance scans are not recommended and he would need a repeat colonoscopy in 1 year.  I have discussed all this with the patient and he comprehends my plan well.  I will see him back in 6 months with CBC with differential CMP and CEA.  Treatment was given with a curative intent   Visit Diagnosis 1. Goals of care, counseling/discussion   2. Malignant neoplasm of sigmoid colon ([place])      Dr. Randa Evens, MD, MPH [facility] [phone] 07/21/2019 1:46 PM

## 2019-07-21 NOTE — Progress Notes (Signed)
Bowels liquid with some form , then reg. And goes back and forth. Then him urinating standing is good but sitting hard to urinate. No pain

## 2019-07-28 ENCOUNTER — Ambulatory Visit (INDEPENDENT_AMBULATORY_CARE_PROVIDER_SITE_OTHER): Payer: Self-pay | Admitting: Surgery

## 2019-07-28 ENCOUNTER — Encounter: Payer: Self-pay | Admitting: Surgery

## 2019-07-28 ENCOUNTER — Other Ambulatory Visit: Payer: Self-pay

## 2019-07-28 VITALS — BP 116/75 | HR 93 | Temp 98.3°F | Ht 73.0 in | Wt 190.0 lb

## 2019-07-28 DIAGNOSIS — C187 Malignant neoplasm of sigmoid colon: Secondary | ICD-10-CM

## 2019-07-28 DIAGNOSIS — R350 Frequency of micturition: Secondary | ICD-10-CM

## 2019-07-28 NOTE — Progress Notes (Signed)
[facility] POST-OP OFFICE VISIT  07/28/2019  HPI: Kirk Rodriguez is a 74 y.o. male is about 4 weeks s/p robotic rectosigmoid resection for colon cancer. He has a list of concerns, some revolving around being able to void while standing but not well sitting on the commode to enable to have an erection but not ejaculating. He reports his bowel movements are more regular with a sporadic dose of Imodium. Reports small caliber of stools that are not difficult to move. Denies seeing any blood in the stools. He has no known fevers chills or discomfort.  Vital signs: BP 116/75   Pulse 93   Temp 98.3 F (36.8 C) (Oral)   Ht 6\' 1"  (1.854 m)   Wt 190 lb (86.2 kg)   SpO2 97%   BMI 25.07 kg/m    Physical Exam: Constitutional: Appears quite well. Abdomen: Soft and nontender. All the incisions are healing nicely. The off-center Pfannenstiel to the left has a slight degree of thickening to it, without induration or erythema. He has a tiny gap at the suprapubic area medially which is still healing. There is no evidence of infection, this area had previously been quite ecchymotic.   Assessment/Plan: This is a 74 y.o. male 4 weeks s/p robotic low anterior resection for rectosigmoid carcinoma.  Patient Active Problem List   Diagnosis Date Noted  . Goals of care, counseling/discussion 07/21/2019  . Adenocarcinoma of sigmoid colon ([place]) 06/29/2019  . Positive FIT (fecal immunochemical test)   . Polyp of colon   . Colon neoplasm   . Carcinoma in situ of larynx 11/18/2018  . Former cigarette smoker 05/01/2016  . COPD (chronic obstructive pulmonary disease) ([place]) 12/04/2015  . History of chickenpox 12/04/2015  . Hyperlipidemia, unspecified 12/04/2015  . Hypertension 12/04/2015  . Current smoker 09/30/2013  . Trigger finger 06/09/2013    -We discussed referring him to Dr. Erlene Quan of urology, and he would like to proceed with this. He has follow-up with his oncologist, I would like  to see him again in about 6 months, or as needed.   Ronny Bacon M.D., FACS 07/28/2019, 9:29 AM

## 2019-07-28 NOTE — Patient Instructions (Addendum)
Dr.Rodenberg recommends patient to increase Fiber intake to help regulate bowels. Patient may try over the counter Gummy Fiber and Probiotics. Dr.Rodenberg recommends follow up with Urologist. Referral sent to [facility]. Facility will contact you to get scheduled for an appointment.  GENERAL POST-OPERATIVE PATIENT INSTRUCTIONS   WOUND CARE INSTRUCTIONS:  Keep a dry clean dressing on the wound if there is drainage. The initial bandage may be removed after 24 hours.  Once the wound has quit draining you may leave it open to air.  If clothing rubs against the wound or causes irritation and the wound is not draining you may cover it with a dry dressing during the daytime.  Try to keep the wound dry and avoid ointments on the wound unless directed to do so.  If the wound becomes bright red and painful or starts to drain infected material that is not clear, please contact your physician immediately.  If the wound is mildly pink and has a thick firm ridge underneath it, this is normal, and is referred to as a healing ridge.  This will resolve over the next 4-6 weeks.  BATHING: You may shower if you have been informed of this by your surgeon. However, Please do not submerge in a tub, hot tub, or pool until incisions are completely sealed or have been told by your surgeon that you may do so.  DIET:  You may eat any foods that you can tolerate.  It is a good idea to eat a high fiber diet and take in plenty of fluids to prevent constipation.  If you do become constipated you may want to take a mild laxative or take ducolax tablets on a daily basis until your bowel habits are regular.  Constipation can be very uncomfortable, along with straining, after recent surgery.  ACTIVITY:  You are encouraged to cough and deep breath or use your incentive spirometer if you were given one, every 15-30 minutes when awake.  This will help prevent respiratory complications and low grade fevers post-operatively if you  had a general anesthetic.  You may want to hug a pillow when coughing and sneezing to add additional support to the surgical area, if you had abdominal or chest surgery, which will decrease pain during these times.  You are encouraged to walk and engage in light activity for the next two weeks.  You should not lift more than 20 pounds, until 4 to 6 weeks after surgery as it could put you at increased risk for complications.  Twenty pounds is roughly equivalent to a plastic bag of groceries. At that time- Listen to your body when lifting, if you have pain when lifting, stop and then try again in a few days. Soreness after doing exercises or activities of daily living is normal as you get back in to your normal routine.  MEDICATIONS:  Try to take narcotic medications and anti-inflammatory medications, such as tylenol, ibuprofen, naprosyn, etc., with food.  This will minimize stomach upset from the medication.  Should you develop nausea and vomiting from the pain medication, or develop a rash, please discontinue the medication and contact your physician.  You should not drive, make important decisions, or operate machinery when taking narcotic pain medication.  SUNBLOCK Use sun block to incision area over the next year if this area will be exposed to sun. This helps decrease scarring and will allow you avoid a permanent darkened area over your incision.  QUESTIONS:  Please feel free to call our office if  you have any questions, and we will be glad to assist you.

## 2019-08-08 ENCOUNTER — Telehealth: Payer: Self-pay | Admitting: *Deleted

## 2019-08-08 NOTE — Telephone Encounter (Signed)
Faxed paperwork to [employer] to [phone]

## 2019-08-16 NOTE — Progress Notes (Signed)
08/17/2019 10:48 AM   Kirk Rodriguez Sep 02, 1945 [id number]  Referring provider: Barbaraann Boys, MD [address] [place],  [place] [address] Chief Complaint  Patient presents with  . Urinary Frequency    HPI: Kirk Rodriguez is a 74 y.o. male with stage I adenocarcinoma of sigmoid/rectum s/p low anterior resection presents today to be evaluated and managed for urinary frequency and erectile dysfunction.   Patient underwent low anterior resection With Dr. Christian Mate on 06/29/2019.  Final pathology showed invasive colorectal adenocarcinoma moderately differentiated with negative margins.  2.3 x 1.7 x 0.7 cm.  No lymphovascular invasion.  16 lymph nodes negative for malignancy.  PT2PN0. Patient had ICG injected into the ureters.   He last saw Dr. Christian Mate on 07/28/19 and noted being able to void while standing but not well sitting on the commode. He also reported being able to have an erection but not ejaculating.  PVR today is 83 mL.   He reports trouble voiding while sitting on commode. He has better flow while standing during urination. Voiding issues are not bothersome but he feels like it is "annoying". He had a DRE before surgery and his prostate was normal.  He recently started taking saw palmetto was not seeing improvement.  He has left testicle tenderness. Tenderness used to be sporadic since his vasectomy but worsened after surgery on 6/23.   He is able to masturbate and climax but reports no ejaculation fluid. He denies starting new medications or pain medication. He states "I don't do medication". Last night he took tylenol for sinus or migraine headaches.   After surgery he went back to daily activities and did not rest like he should have. His wife asked him to slow down and take it easy.    IPSS    Row Name 08/17/19 1000         International Prostate Symptom Score   How often have you had the sensation of not emptying your bladder? Less than 1 in 5     How often have  you had to urinate less than every two hours? Less than 1 in 5 times     How often have you found you stopped and started again several times when you urinated? Less than 1 in 5 times     How often have you found it difficult to postpone urination? Less than 1 in 5 times     How often have you had a weak urinary stream? Not at All     How often have you had to strain to start urination? Less than 1 in 5 times     How many times did you typically get up at night to urinate? 3 Times     Total IPSS Score 8       Quality of Life due to urinary symptoms   If you were to spend the rest of your life with your urinary condition just the way it is now how would you feel about that? Pleased            Score:  1-7 Mild 8-19 Moderate 20-35 Severe   PMH: Past Medical History:  Diagnosis Date  . Allergy   . Cancer ([place])    Larynx--carcinoma in situ  . Colon cancer ([place])   . COPD (chronic obstructive pulmonary disease) (HCC)    no inhalers  . GERD (gastroesophageal reflux disease)    no meds  . Headache    h/o migraines  . Heart murmur  as a child  . Hyperlipidemia   . Wears dentures    full upper, partial lower    Surgical History: Past Surgical History:  Procedure Laterality Date  . COLONOSCOPY WITH PROPOFOL N/A 06/02/2019   Procedure: COLONOSCOPY WITH PROPOFOL;  Surgeon: Virgel Manifold, MD;  Location: [facility];  Service: Endoscopy;  Laterality: N/A;  . CYSTOSCOPY N/A 06/29/2019   Procedure: CYSTOSCOPY;  Surgeon: Hollice Espy, MD;  Location: [facility];  Service: Urology;  Laterality: N/A;  . DIRECT LARYNGOSCOPY Right 04/03/2016   Procedure: DIRECT MICROLARYNGOSCOPY WITH EXCISON RIGHT VOCAL CORD LESION;  Surgeon: Margaretha Sheffield, MD;  Location: [place];  Service: ENT;  Laterality: Right;  RIGHT   . INGUINAL HERNIA REPAIR Bilateral 12/18/2015   Procedure: LAPAROSCOPIC BILATERAL INGUINAL HERNIA REPAIR;  Surgeon: Hubbard Robinson, MD;  Location: [facility];   Service: General;  Laterality: Bilateral;  . MINOR EXCISION OF ORAL LESION Left 03/06/2016   Procedure: direct microlaryngoscopy with excision left vocal cord lesion;  Surgeon: Margaretha Sheffield, MD;  Location: [place];  Service: ENT;  Laterality: Left;  left vocal cord lesion  . TRIGGER FINGER RELEASE Left 2016  . VASECTOMY Bilateral 1978  . XI ROBOTIC ASSISTED LOWER ANTERIOR RESECTION N/A 06/29/2019   Procedure: XI ROBOTIC ASSISTED LOWER ANTERIOR RESECTION;  Surgeon: Ronny Bacon, MD;  Location: [facility];  Service: General;  Laterality: N/A;    Home Medications:  Allergies as of 08/17/2019   No Known Allergies     Medication List       Accurate as of August 17, 2019 11:59 PM. If you have any questions, ask your nurse or doctor.        acetaminophen 500 MG tablet Commonly known as: TYLENOL Take 500 mg by mouth every 6 (six) hours as needed (pain.).   aspirin EC 81 MG tablet Take 81 mg by mouth daily.   beta carotene w/minerals tablet Take 1 tablet by mouth at bedtime.   BLUE-EMU SUPER STRENGTH EX Apply 1 application topically 2 (two) times daily as needed (applied to right hand as needed for soreness/pain.).   diphenhydramine-acetaminophen 25-500 MG Tabs tablet Commonly known as: TYLENOL PM Take 0.5 tablets by mouth at bedtime.   multivitamin with minerals Tabs tablet Take 1 tablet by mouth daily.   Nasacort AQ 55 MCG/ACT Aero nasal inhaler Generic drug: triamcinolone Place 1 spray into the nose daily.   OSTEO BI-FLEX TRIPLE STRENGTH PO Take 1 tablet by mouth in the morning and at bedtime.   saw palmetto 160 MG capsule Take 160 mg by mouth 2 (two) times daily.   simvastatin 80 MG tablet Commonly known as: ZOCOR Take 80 mg by mouth at bedtime.   Vitamin D3 50 MCG (2000 UT) capsule Take 2,000 Units by mouth daily.       Allergies: No Known Allergies  Family History: Family History  Problem Relation Age of Onset  . Lung cancer Father   . Heart  disease Father   . Hypertension Father   . Polycythemia Mother   . Heart disease Mother     Social History:  reports that he quit smoking about 4 years ago. His smoking use included cigarettes. He has a 79.50 pack-year smoking history. He has quit using smokeless tobacco. He reports previous alcohol use. He reports that he does not use drugs.   Physical Exam: BP 125/81   Pulse 82   Ht 6\' 1"  (1.854 m)   Wt 191 lb (86.6 kg)   BMI  25.20 kg/m   Constitutional:  Alert and oriented, No acute distress. HEENT: Door AT, moist mucus membranes.  Trachea midline, no masses. Cardiovascular: No clubbing, cyanosis, or edema. Respiratory: Normal respiratory effort, no increased work of breathing. GI: Abdomen is soft, nontender, nondistended, no abdominal masses GU: No CVA tenderness. Left epididymis full and slight enlarged/inflamed. Lymph: No cervical or inguinal lymphadenopathy. Skin: No rashes, bruises or suspicious lesions. Neurologic: Grossly intact, no focal deficits, moving all 4 extremities. Psychiatric: Normal mood and affect.  Laboratory Data:  Lab Results  Component Value Date   CREATININE 1.20 07/01/2019    Lab Results  Component Value Date   HGBA1C 6.0 (H) 06/28/2019    Urinalysis Negative  Pertinent Imaging: Results for orders placed or performed in visit on 08/17/19  PSA  Result Value Ref Range   Prostate Specific Ag, Serum 0.9 0.0 - 4.0 ng/mL  Bladder Scan (Post Void Residual) in office  Result Value Ref Range   Scan Result 83       Assessment & Plan:    1. BPH with weak urinary stream Patient is on palmetto.  He does not like to take medication. Patient has difficulty starting stream this could be due to obstruction. IPSS score: 8, moderate.  PRV is 83 mL mildly elevated. Patient declined DRE today since Dr. Christian Mate recently performed one and noted his prostate was smooth and round. PSA today.  May be interested in UroLift procedure in the future,  understands he would need a cystoscopy/TRUS to evaluate whether or not he is a candidate.  2. Retrograde ejaculation  SHIM score: 24, No ED Patient was concerned about having no ejaculation fluid.  Possibly surgically related and patient as reassured.   3. Left testicular pain UA is negative with no epididymis bacteria. Pain is improving after surgery and inflammation is possibly related to post op  Continue NSAIDs     Cysto/ trus in 6 weeks  [facility] [address] [place], [place] [address] [phone]  I, Selena Batten, am acting as a scribe for Dr. Hollice Espy.  I have reviewed the above documentation for accuracy and completeness, and I agree with the above.   Hollice Espy, MD

## 2019-08-17 ENCOUNTER — Ambulatory Visit: Payer: Medicare PPO | Admitting: Urology

## 2019-08-17 ENCOUNTER — Other Ambulatory Visit: Payer: Self-pay

## 2019-08-17 ENCOUNTER — Encounter: Payer: Self-pay | Admitting: Urology

## 2019-08-17 VITALS — BP 125/81 | HR 82 | Ht 73.0 in | Wt 191.0 lb

## 2019-08-17 DIAGNOSIS — R350 Frequency of micturition: Secondary | ICD-10-CM | POA: Diagnosis not present

## 2019-08-17 LAB — BLADDER SCAN AMB NON-IMAGING: Scan Result: 83

## 2019-08-17 NOTE — Patient Instructions (Addendum)
Cystoscopy Cystoscopy is a procedure that is used to help diagnose and sometimes treat conditions that affect the lower urinary tract. The lower urinary tract includes the bladder and the urethra. The urethra is the tube that drains urine from the bladder. Cystoscopy is done using a thin, tube-shaped instrument with a light and camera at the end (cystoscope). The cystoscope may be hard or flexible, depending on the goal of the procedure. The cystoscope is inserted through the urethra, into the bladder. Cystoscopy may be recommended if you have:  Urinary tract infections that keep coming back.  Blood in the urine (hematuria).  An inability to control when you urinate (urinary incontinence) or an overactive bladder.  Unusual cells found in a urine sample.  A blockage in the urethra, such as a urinary stone.  Painful urination.  An abnormality in the bladder found during an intravenous pyelogram (IVP) or CT scan. Cystoscopy may also be done to remove a sample of tissue to be examined under a microscope (biopsy). What are the risks? Generally, this is a safe procedure. However, problems may occur, including:  Infection.  Bleeding.  What happens during the procedure?  1. You will be given one or more of the following: ? A medicine to numb the area (local anesthetic). 2. The area around the opening of your urethra will be cleaned. 3. The cystoscope will be passed through your urethra into your bladder. 4. Germ-free (sterile) fluid will flow through the cystoscope to fill your bladder. The fluid will stretch your bladder so that your health care provider can clearly examine your bladder walls. 5. Your doctor will look at the urethra and bladder. 6. The cystoscope will be removed The procedure may vary among health care providers  What can I expect after the procedure? After the procedure, it is common to have: 1. Some soreness or pain in your abdomen and urethra. 2. Urinary symptoms.  These include: ? Mild pain or burning when you urinate. Pain should stop within a few minutes after you urinate. This may last for up to 1 week. ? A small amount of blood in your urine for several days. ? Feeling like you need to urinate but producing only a small amount of urine. Follow these instructions at home: General instructions  Return to your normal activities as told by your health care provider.   Do not drive for 24 hours if you were given a sedative during your procedure.  Watch for any blood in your urine. If the amount of blood in your urine increases, call your health care provider.  If a tissue sample was removed for testing (biopsy) during your procedure, it is up to you to get your test results. Ask your health care provider, or the department that is doing the test, when your results will be ready.  Drink enough fluid to keep your urine pale yellow.  Keep all follow-up visits as told by your health care provider. This is important. Contact a health care provider if you:  Have pain that gets worse or does not get better with medicine, especially pain when you urinate.  Have trouble urinating.  Have more blood in your urine. Get help right away if you:  Have blood clots in your urine.  Have abdominal pain.  Have a fever or chills.  Are unable to urinate. Summary  Cystoscopy is a procedure that is used to help diagnose and sometimes treat conditions that affect the lower urinary tract.  Cystoscopy is done using   a thin, tube-shaped instrument with a light and camera at the end.  After the procedure, it is common to have some soreness or pain in your abdomen and urethra.  Watch for any blood in your urine. If the amount of blood in your urine increases, call your health care provider.  If you were prescribed an antibiotic medicine, take it as told by your health care provider. Do not stop taking the antibiotic even if you start to feel better. This  information is not intended to replace advice given to you by your health care provider. Make sure you discuss any questions you have with your health care provider. Document Revised: 12/15/2017 Document Reviewed: 12/15/2017 Elsevier Patient Education  [employer].    Transrectal Ultrasound Transrectal ultrasound is a procedure that uses sound waves to create images of your prostate gland and nearby tissues. The sound waves are sent through the wall of your rectum into your prostate gland, which is located in front of your rectum. The images show the size and shape of your prostate gland and nearby structures. You may have this test if you have:  Trouble urinating.  Infertility.  An abnormal prostate screening exam. Tell a health care provider about:  Any allergies you have.  All medicines you are taking, including vitamins, herbs, eye drops, creams, and over-the-counter medicines.  Any blood disorders you have.  Any medical conditions you have.  Any surgeries you have had. What are the risks? Generally, this is a safe procedure. However, problems may occur, including:  Discomfort during the procedure. This is rare.  Blood in your urine or sperm after the procedure. This is rare.   What happens during the procedure?  You will be asked to lie down on your left side on an examination table.  You will bend your knees toward your chest.  A lubricated probe will be gently inserted into your rectum. This may cause a feeling of fullness.  The probe will send signals to a computer that will create images.  The technician will slightly rotate the probe throughout the procedure. While rotating the probe, he or she will view and capture images of the prostate gland and the surrounding structures from different angles.  The probe will be removed. The procedure may vary among health care providers and hospitals. What happens after the procedure?  It is up to you to get the  results of your procedure. Ask your health care provider, or the department that is doing the procedure, when your results will be ready. Summary  Transrectal ultrasound is a procedure that uses sound waves to create images of your prostate gland and nearby tissues.  The images show the size and shape of your prostate gland and nearby structures.  Before the procedure, ask your health care provider about changing or stopping your regular medicines. This is especially important if you are taking diabetes medicines or blood thinners. This information is not intended to replace advice given to you by your health care provider. Make sure you discuss any questions you have with your health care provider. Document Revised: 12/05/2016 Document Reviewed: 11/16/2015 Elsevier Patient Education  2020 [employer].

## 2019-08-18 ENCOUNTER — Other Ambulatory Visit: Payer: Self-pay

## 2019-08-18 ENCOUNTER — Telehealth: Payer: Self-pay | Admitting: *Deleted

## 2019-08-18 ENCOUNTER — Ambulatory Visit
Admission: RE | Admit: 2019-08-18 | Discharge: 2019-08-18 | Disposition: A | Discharge status: Home / Self Care | Payer: Medicare PPO | Source: Ambulatory Visit | Attending: Radiation Oncology | Admitting: Radiation Oncology

## 2019-08-18 ENCOUNTER — Encounter: Payer: Self-pay | Admitting: Radiation Oncology

## 2019-08-18 VITALS — BP 128/77 | HR 68 | Temp 96.3°F | Resp 16 | Wt 189.9 lb

## 2019-08-18 DIAGNOSIS — D020 Carcinoma in situ of larynx: Secondary | ICD-10-CM

## 2019-08-18 DIAGNOSIS — Z8521 Personal history of malignant neoplasm of larynx: Secondary | ICD-10-CM | POA: Insufficient documentation

## 2019-08-18 DIAGNOSIS — Z923 Personal history of irradiation: Secondary | ICD-10-CM | POA: Diagnosis not present

## 2019-08-18 DIAGNOSIS — Z85038 Personal history of other malignant neoplasm of large intestine: Secondary | ICD-10-CM | POA: Diagnosis not present

## 2019-08-18 LAB — MICROSCOPIC EXAMINATION
Bacteria, UA: NONE SEEN
RBC, Urine: NONE SEEN /hpf (ref 0–2)

## 2019-08-18 LAB — URINALYSIS, COMPLETE
Bilirubin, UA: NEGATIVE
Glucose, UA: NEGATIVE
Ketones, UA: NEGATIVE
Leukocytes,UA: NEGATIVE
Nitrite, UA: NEGATIVE
Protein,UA: NEGATIVE
RBC, UA: NEGATIVE
Specific Gravity, UA: 1.02 (ref 1.005–1.030)
Urobilinogen, Ur: 0.2 mg/dL (ref 0.2–1.0)
pH, UA: 5.5 (ref 5.0–7.5)

## 2019-08-18 LAB — PSA: Prostate Specific Ag, Serum: 0.9 ng/mL (ref 0.0–4.0)

## 2019-08-18 NOTE — Telephone Encounter (Addendum)
Left patient VM, asked to return call with any questions.  ----- Message from Hollice Espy, MD sent at 08/18/2019  7:55 AM EDT ----- PSA is great, 0.9   Hollice Espy, MD

## 2019-08-18 NOTE — Progress Notes (Signed)
Radiation Oncology Follow up Note  Name: Kirk Rodriguez   Date:   08/18/2019 MRN:  [id number] DOB: Jul 25, 1945    This 74 y.o. male presents to the clinic today for 3-year follow-up status post external beam radiation therapy prescribed cell carcinoma of the larynx.  REFERRING PROVIDER: Barbaraann Boys, MD  HPI: Patient is a 74 year old male now seen at 3 years having completed external beam radiation therapy for squamous cell carcinoma of the larynx.  This was stage once.  He is seen today in routine follow-up doing well specifically denies any dysphagia head and neck pain or discomfort sore throat.  His total quality is good.  He continues close follow-up with Dr. Farrel Conners who is been performing upper endoscopy..  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  His bowel function is good.  He had CT scans chest abdomen pelvis showing mild irregular wall thickening along the right lateral aspect of the upper rectum no findings for metastatic disease in chest abdomen or pelvis small bilateral pulmonary nodules are unchanged.  COMPLICATIONS OF TREATMENT: none  FOLLOW UP COMPLIANCE: keeps appointments   PHYSICAL EXAM:  BP 128/77 (BP Location: Left Arm, Patient Position: Sitting)   Pulse 68   Temp (!) 96.3 F (35.7 C) (Tympanic)   Resp 16   Wt 189 lb 14.4 oz (86.1 kg)   BMI 25.05 kg/m  No evidence of cervical or supraclavicular adenopathy is appreciated.  Well-developed well-nourished patient in NAD. HEENT reveals PERLA, EOMI, discs not visualized.  Oral cavity is clear. No oral mucosal lesions are identified. Neck is clear without evidence of cervical or supraclavicular adenopathy. Lungs are clear to A&P. Cardiac examination is essentially unremarkable with regular rate and rhythm without murmur rub or thrill. Abdomen is benign with no organomegaly or masses noted. Motor sensory and DTR levels  are equal and symmetric in the upper and lower extremities. Cranial nerves II through XII are grossly intact. Proprioception is intact. No peripheral adenopathy or edema is identified. No motor or sensory levels are noted. Crude visual fields are within normal range.  RADIOLOGY RESULTS: CT scans chest abdomen pelvis reviewed compatible with above-stated findings  PLAN: Present time patient is now over 3 years out with no evidence of disease of head and neck cancer.  And pleased with his overall progress.  Since Dr. Erma Heritage able to form he performed easily upper endoscopy will turn follow-up care over to him.  We will be happy to reevaluate the patient anytime the future.  I would like to take this opportunity to thank you for allowing me to participate in the care of your patient.Noreene Filbert, MD

## 2019-09-13 ENCOUNTER — Other Ambulatory Visit: Payer: Self-pay | Admitting: Urology

## 2019-09-17 ENCOUNTER — Telehealth: Payer: Self-pay | Admitting: *Deleted

## 2019-11-22 DIAGNOSIS — Z8521 Personal history of malignant neoplasm of larynx: Secondary | ICD-10-CM | POA: Diagnosis not present

## 2019-11-22 DIAGNOSIS — R490 Dysphonia: Secondary | ICD-10-CM | POA: Diagnosis not present

## 2020-01-12 DIAGNOSIS — J410 Simple chronic bronchitis: Secondary | ICD-10-CM | POA: Diagnosis not present

## 2020-01-12 DIAGNOSIS — Z85038 Personal history of other malignant neoplasm of large intestine: Secondary | ICD-10-CM | POA: Diagnosis not present

## 2020-01-12 DIAGNOSIS — Z87891 Personal history of nicotine dependence: Secondary | ICD-10-CM | POA: Diagnosis not present

## 2020-01-12 DIAGNOSIS — R7303 Prediabetes: Secondary | ICD-10-CM | POA: Diagnosis not present

## 2020-01-12 DIAGNOSIS — R7301 Impaired fasting glucose: Secondary | ICD-10-CM | POA: Diagnosis not present

## 2020-01-12 DIAGNOSIS — I7090 Unspecified atherosclerosis: Secondary | ICD-10-CM | POA: Diagnosis not present

## 2020-01-12 DIAGNOSIS — D020 Carcinoma in situ of larynx: Secondary | ICD-10-CM | POA: Diagnosis not present

## 2020-01-12 DIAGNOSIS — Z0000 Encounter for general adult medical examination without abnormal findings: Secondary | ICD-10-CM | POA: Diagnosis not present

## 2020-01-12 DIAGNOSIS — E782 Mixed hyperlipidemia: Secondary | ICD-10-CM | POA: Diagnosis not present

## 2020-01-20 ENCOUNTER — Other Ambulatory Visit: Payer: Self-pay

## 2020-01-20 ENCOUNTER — Inpatient Hospital Stay: Payer: Medicare PPO | Admitting: Oncology

## 2020-01-20 ENCOUNTER — Inpatient Hospital Stay: Payer: Medicare PPO | Attending: Oncology

## 2020-01-20 ENCOUNTER — Encounter: Payer: Self-pay | Admitting: Oncology

## 2020-01-20 VITALS — BP 109/84 | HR 77 | Temp 97.5°F | Wt 188.8 lb

## 2020-01-20 DIAGNOSIS — C187 Malignant neoplasm of sigmoid colon: Secondary | ICD-10-CM

## 2020-01-20 DIAGNOSIS — Z87891 Personal history of nicotine dependence: Secondary | ICD-10-CM | POA: Insufficient documentation

## 2020-01-20 DIAGNOSIS — Z79899 Other long term (current) drug therapy: Secondary | ICD-10-CM | POA: Diagnosis not present

## 2020-01-20 DIAGNOSIS — J449 Chronic obstructive pulmonary disease, unspecified: Secondary | ICD-10-CM | POA: Insufficient documentation

## 2020-01-20 DIAGNOSIS — E785 Hyperlipidemia, unspecified: Secondary | ICD-10-CM | POA: Diagnosis not present

## 2020-01-20 DIAGNOSIS — Z08 Encounter for follow-up examination after completed treatment for malignant neoplasm: Secondary | ICD-10-CM | POA: Diagnosis not present

## 2020-01-20 DIAGNOSIS — Z85038 Personal history of other malignant neoplasm of large intestine: Secondary | ICD-10-CM | POA: Diagnosis not present

## 2020-01-20 DIAGNOSIS — Z7189 Other specified counseling: Secondary | ICD-10-CM

## 2020-01-20 DIAGNOSIS — K219 Gastro-esophageal reflux disease without esophagitis: Secondary | ICD-10-CM | POA: Insufficient documentation

## 2020-01-20 DIAGNOSIS — Z86010 Personal history of colonic polyps: Secondary | ICD-10-CM | POA: Insufficient documentation

## 2020-01-20 DIAGNOSIS — R011 Cardiac murmur, unspecified: Secondary | ICD-10-CM | POA: Diagnosis not present

## 2020-01-20 DIAGNOSIS — Z801 Family history of malignant neoplasm of trachea, bronchus and lung: Secondary | ICD-10-CM | POA: Diagnosis not present

## 2020-01-20 LAB — COMPREHENSIVE METABOLIC PANEL
ALT: 21 U/L (ref 0–44)
AST: 17 U/L (ref 15–41)
Albumin: 4.4 g/dL (ref 3.5–5.0)
Alkaline Phosphatase: 51 U/L (ref 38–126)
Anion gap: 8 (ref 5–15)
BUN: 15 mg/dL (ref 8–23)
CO2: 26 mmol/L (ref 22–32)
Calcium: 9.4 mg/dL (ref 8.9–10.3)
Chloride: 103 mmol/L (ref 98–111)
Creatinine, Ser: 1.26 mg/dL — ABNORMAL HIGH (ref 0.61–1.24)
GFR, Estimated: 60 mL/min — ABNORMAL LOW (ref >60)
Glucose, Bld: 116 mg/dL — ABNORMAL HIGH (ref 70–99)
Potassium: 4.1 mmol/L (ref 3.5–5.1)
Sodium: 137 mmol/L (ref 135–145)
Total Bilirubin: 0.7 mg/dL (ref 0.3–1.2)
Total Protein: 7 g/dL (ref 6.5–8.1)

## 2020-01-20 LAB — CBC WITH DIFFERENTIAL/PLATELET
Abs Immature Granulocytes: 0.03 10*3/uL (ref 0.00–0.07)
Basophils Absolute: 0.1 10*3/uL (ref 0.0–0.1)
Basophils Relative: 1 %
Eosinophils Absolute: 0.2 10*3/uL (ref 0.0–0.5)
Eosinophils Relative: 2 %
HCT: 41 % (ref 39.0–52.0)
Hemoglobin: 14 g/dL (ref 13.0–17.0)
Immature Granulocytes: 0 %
Lymphocytes Relative: 24 %
Lymphs Abs: 2.2 10*3/uL (ref 0.7–4.0)
MCH: 30 pg (ref 26.0–34.0)
MCHC: 34.1 g/dL (ref 30.0–36.0)
MCV: 87.8 fL (ref 80.0–100.0)
Monocytes Absolute: 0.8 10*3/uL (ref 0.1–1.0)
Monocytes Relative: 9 %
Neutro Abs: 5.8 10*3/uL (ref 1.7–7.7)
Neutrophils Relative %: 64 %
Platelets: 256 10*3/uL (ref 150–400)
RBC: 4.67 MIL/uL (ref 4.22–5.81)
RDW: 14 % (ref 11.5–15.5)
WBC: 9 10*3/uL (ref 4.0–10.5)
nRBC: 0 % (ref 0.0–0.2)

## 2020-01-20 NOTE — Progress Notes (Signed)
Survivorship Care Plan visit completed.  Treatment summary reviewed and given to patient.  ASCO answers booklet reviewed and given to patient.  CARE program and Cancer Transitions discussed with patient along with other resources cancer center offers to patients and caregivers.  Patient verbalized understanding.    

## 2020-01-20 NOTE — Progress Notes (Signed)
Pt has diarrhea without notice and can't get to the toilet. He tried modium 1/2 tab and he ended up contstipated each time. Now he uses peptobismol and takes 1/2 pil every day and he is good. He wants to talk to you about prep -he wants pills like his wife got

## 2020-01-20 NOTE — Progress Notes (Signed)
Hematology/Oncology Consult note [facility]  [phone] [phone]  Patient Care Team: Barbaraann Boys, MD as PCP - General (Family Medicine) Clent Jacks, RN as Oncology Nurse Navigator Sindy Guadeloupe, MD as Consulting Physician (Oncology) Ronny Bacon, MD as Consulting Physician (General Surgery)   Name of the patient: Kirk Rodriguez  [id number]  10-31-1945   Date of visit: 01/20/20  Diagnosis- stage I adenocarcinoma of sigmoid/rectum s/p low anterior resection  Chief complaint/ Reason for visit-routine follow-up of colon cancer  Heme/Onc history:  patient is a 75 year old male who was seen by Dr. Bonna Gains from GI for positive fit test.He underwent a colonoscopy on 06/02/2019 which showed 6 sessile polyps in the transverse colon ascending colon and cecum. 2 sessile semipedunculated nonbleeding polyps in the sigmoid colon and descending colon. An ulcerating nonobstructing large mass 15 cm proximal to the anus. Mass was noncircumferential. No bleeding was present. Right and left colon polyps pathology was consistent with a tubular adenoma negative for high-grade dysplasia and malignancy. Colorectal mass positive for invasive colon adenocarcinoma. Sigmoid colon polyp tubulovillous adenoma. Negative for high-grade dysplasia.  CT chest abdomen pelvis did not show any findings of metastatic disease.He was noted to have small scattered bilateral pulmonary nodules which were unchanged from prior exams and possibly benign.   Patient underwent low anterior resection With Dr. Christian Mate on 06/29/2019.  Final pathology showed invasive colorectal adenocarcinoma moderately differentiated with negative margins.  2.3 x 1.7 x 0.7 cm.  No lymphovascular invasion.  16 lymph nodes negative for malignancy.  PT2PN0  Interval history-patient reports doing well and denies any complaints at this time.  He is quite active for his age.  Appetite and  weight have remained stable.  Denies any blood loss in his stool or urine.  ECOG PS- 0 Pain scale- 0  Review of systems- Review of Systems  Constitutional: Negative for chills, fever, malaise/fatigue and weight loss.  HENT: Negative for congestion, ear discharge and nosebleeds.   Eyes: Negative for blurred vision.  Respiratory: Negative for cough, hemoptysis, sputum production, shortness of breath and wheezing.   Cardiovascular: Negative for chest pain, palpitations, orthopnea and claudication.  Gastrointestinal: Negative for abdominal pain, blood in stool, constipation, diarrhea, heartburn, melena, nausea and vomiting.  Genitourinary: Negative for dysuria, flank pain, frequency, hematuria and urgency.  Musculoskeletal: Negative for back pain, joint pain and myalgias.  Skin: Negative for rash.  Neurological: Negative for dizziness, tingling, focal weakness, seizures, weakness and headaches.  Endo/Heme/Allergies: Does not bruise/bleed easily.  Psychiatric/Behavioral: Negative for depression and suicidal ideas. The patient does not have insomnia.       No Known Allergies   Past Medical History:  Diagnosis Date  . Allergy   . Cancer ([place])    Larynx--carcinoma in situ  . Colon cancer ([place])   . COPD (chronic obstructive pulmonary disease) ([facility])    no inhalers  . GERD (gastroesophageal reflux disease)    no meds  . Headache    h/o migraines  . Heart murmur    as a child  . Hyperlipidemia   . Wears dentures    full upper, partial lower     Past Surgical History:  Procedure Laterality Date  . COLONOSCOPY WITH PROPOFOL N/A 06/02/2019   Procedure: COLONOSCOPY WITH PROPOFOL;  Surgeon: Virgel Manifold, MD;  Location: [facility]COPY;  Service: Endoscopy;  Laterality: N/A;  . CYSTOSCOPY N/A 06/29/2019   Procedure: CYSTOSCOPY;  Surgeon: Hollice Espy, MD;  Location: [facility] ORS;  Service:  Urology;  Laterality: N/A;  . DIRECT LARYNGOSCOPY Right 04/03/2016   Procedure: DIRECT  MICROLARYNGOSCOPY WITH EXCISON RIGHT VOCAL CORD LESION;  Surgeon: Margaretha Sheffield, MD;  Location: [place];  Service: ENT;  Laterality: Right;  RIGHT   . INGUINAL HERNIA REPAIR Bilateral 12/18/2015   Procedure: LAPAROSCOPIC BILATERAL INGUINAL HERNIA REPAIR;  Surgeon: Hubbard Robinson, MD;  Location: [facility] ORS;  Service: General;  Laterality: Bilateral;  . MINOR EXCISION OF ORAL LESION Left 03/06/2016   Procedure: direct microlaryngoscopy with excision left vocal cord lesion;  Surgeon: Margaretha Sheffield, MD;  Location: [place];  Service: ENT;  Laterality: Left;  left vocal cord lesion  . TRIGGER FINGER RELEASE Left 2016  . VASECTOMY Bilateral 1978  . XI ROBOTIC ASSISTED LOWER ANTERIOR RESECTION N/A 06/29/2019   Procedure: XI ROBOTIC ASSISTED LOWER ANTERIOR RESECTION;  Surgeon: Ronny Bacon, MD;  Location: [facility] ORS;  Service: General;  Laterality: N/A;    Social History   Socioeconomic History  . Marital status: Married    Spouse name: Not on file  . Number of children: Not on file  . Years of education: Not on file  . Highest education level: Not on file  Occupational History  . Not on file  Tobacco Use  . Smoking status: Former Smoker    Packs/day: 1.50    Years: 53.00    Pack years: 79.50    Types: Cigarettes    Quit date: 04/18/2015    Years since quitting: 4.7  . Smokeless tobacco: Former Network engineer  . Vaping Use: Never used  Substance and Sexual Activity  . Alcohol use: Not Currently  . Drug use: No  . Sexual activity: Not Currently  Other Topics Concern  . Not on file  Social History Narrative  . Not on file   Social Determinants of Health   Financial Resource Strain: Not on file  Food Insecurity: Not on file  Transportation Needs: Not on file  Physical Activity: Not on file  Stress: Not on file  Social Connections: Not on file  Intimate Partner Violence: Not on file    Family History  Problem Relation Age of Onset  . Lung cancer Father    . Heart disease Father   . Hypertension Father   . Polycythemia Mother   . Heart disease Mother      Current Outpatient Medications:  .  acetaminophen (TYLENOL) 500 MG tablet, Take 500 mg by mouth every 6 (six) hours as needed (pain.). , Disp: , Rfl:  .  ALEVE PM 220-25 MG TABS, Take 1 tablet by mouth daily., Disp: , Rfl:  .  beta carotene w/minerals (OCUVITE) tablet, Take 1 tablet by mouth at bedtime., Disp: , Rfl:  .  bismuth subsalicylate (PEPTO BISMOL) 262 MG chewable tablet, Chew 524 mg by mouth daily. Taking 1/2 tablet every morning, Disp: , Rfl:  .  Cholecalciferol (VITAMIN D3) 2000 units capsule, Take 2,000 Units by mouth daily. , Disp: , Rfl:  .  Liniments (BLUE-EMU SUPER STRENGTH EX), Apply 1 application topically 2 (two) times daily as needed (applied to right hand as needed for soreness/pain.)., Disp: , Rfl:  .  Misc Natural Products (OSTEO BI-FLEX TRIPLE STRENGTH PO), Take 1 tablet by mouth in the morning and at bedtime., Disp: , Rfl:  .  Multiple Vitamin (MULTIVITAMIN WITH MINERALS) TABS tablet, Take 1 tablet by mouth daily., Disp: , Rfl:  .  saw palmetto 160 MG capsule, Take 160 mg by mouth 2 (two) times  daily., Disp: , Rfl:  .  simvastatin (ZOCOR) 80 MG tablet, Take 80 mg by mouth at bedtime. , Disp: , Rfl:  .  triamcinolone (NASACORT) 55 MCG/ACT AERO nasal inhaler, Place 1 spray into the nose daily. , Disp: , Rfl:   Physical exam:  Vitals:   01/20/20 1014  BP: 109/84  Pulse: 77  Temp: (!) 97.5 F (36.4 C)  TempSrc: Tympanic  SpO2: 98%  Weight: 188 lb 12.8 oz (85.6 kg)   Physical Exam Eyes:     Extraocular Movements: EOM normal.  Cardiovascular:     Rate and Rhythm: Normal rate and regular rhythm.     Heart sounds: Normal heart sounds.  Pulmonary:     Effort: Pulmonary effort is normal.     Breath sounds: Normal breath sounds.  Abdominal:     General: Bowel sounds are normal.     Palpations: Abdomen is soft.  Skin:    General: Skin is warm and dry.   Neurological:     Mental Status: He is alert and oriented to person, place, and time.      CMP Latest Ref Rng & Units 07/01/2019  Glucose 70 - 99 mg/dL 113(H)  BUN 8 - 23 mg/dL 25(H)  Creatinine 0.61 - 1.24 mg/dL 1.20  Sodium 135 - 145 mmol/L 141  Potassium 3.5 - 5.1 mmol/L 4.1  Chloride 98 - 111 mmol/L 107  CO2 22 - 32 mmol/L 25  Calcium 8.9 - 10.3 mg/dL 8.8(L)  Total Protein 6.5 - 8.1 g/dL -  Total Bilirubin 0.3 - 1.2 mg/dL -  Alkaline Phos 38 - 126 U/L -  AST 15 - 41 U/L -  ALT 0 - 44 U/L -   CBC Latest Ref Rng & Units 01/20/2020  WBC 4.0 - 10.5 K/uL 9.0  Hemoglobin 13.0 - 17.0 g/dL 14.0  Hematocrit 39.0 - 52.0 % 41.0  Platelets 150 - 400 K/uL 256     Assessment and plan- Patient is a 75 y.o. male with stage I moderately differentiated adenocarcinoma of the sigmoid colon/rectum stage I T2 N0 M0 s/p resection.  He is here for routine follow-up  Labs from today show normal hemoglobin. CEA is currently pending.  He will be due for a repeat colonoscopy in May 2022 which will be coordinated by Dr. Bonna Gains.  I will get in touch with her about this.  Patient prefers to take pills rather than swallow liquid prep for his colonoscopy.  I have asked him to get in touch with Dr. Bonna Gains about this.  Patient does not require oncology follow-up at this time for his stage I colon cancer and can continue to follow-up with his primary care doctor.  He can be referred to [place] in the future if questions or concerns arise.  He does not require any surveillance imaging or blood tests for colon cancer     Visit Diagnosis 1. Encounter for follow-up surveillance of colon cancer      Dr. Randa Evens, MD, MPH [facility] [phone] 01/20/2020 10:39 AM

## 2020-01-21 LAB — CEA: CEA: 1.8 ng/mL (ref 0.0–4.7)

## 2020-02-09 ENCOUNTER — Telehealth: Payer: Self-pay

## 2020-02-09 NOTE — Telephone Encounter (Signed)
Called patient and had to leave him a detailed message letting him know that he is due for a colonoscopy.

## 2020-02-09 NOTE — Telephone Encounter (Signed)
-----   Message from Virgel Manifold, MD sent at 01/26/2020  2:49 PM EST ----- Are we scheudling for May? He needs colonoscopy in May for history of colon cancer ----- Message ----- From: Sindy Guadeloupe, MD Sent: 01/20/2020   1:09 PM EST To: Virgel Manifold, MD

## 2020-02-10 ENCOUNTER — Other Ambulatory Visit: Payer: Self-pay

## 2020-02-10 DIAGNOSIS — Z85038 Personal history of other malignant neoplasm of large intestine: Secondary | ICD-10-CM

## 2020-02-10 MED ORDER — SUTAB 1479-225-188 MG PO TABS: ORAL_TABLET | ORAL | 0 refills | Status: DC

## 2020-02-10 NOTE — Addendum Note (Signed)
Addended by: Wayna Chalet on: 02/10/2020 09:57 AM   Modules accepted: Orders

## 2020-03-22 ENCOUNTER — Encounter: Payer: Self-pay | Admitting: Emergency Medicine

## 2020-03-22 ENCOUNTER — Other Ambulatory Visit: Payer: Self-pay

## 2020-03-22 ENCOUNTER — Ambulatory Visit
Admission: EM | Admit: 2020-03-22 | Discharge: 2020-03-22 | Disposition: A | Discharge status: Home / Self Care | Payer: Medicare PPO | Attending: Family Medicine | Admitting: Family Medicine

## 2020-03-22 DIAGNOSIS — D72829 Elevated white blood cell count, unspecified: Secondary | ICD-10-CM | POA: Diagnosis not present

## 2020-03-22 DIAGNOSIS — Z20822 Contact with and (suspected) exposure to covid-19: Secondary | ICD-10-CM | POA: Diagnosis not present

## 2020-03-22 DIAGNOSIS — R509 Fever, unspecified: Secondary | ICD-10-CM | POA: Insufficient documentation

## 2020-03-22 LAB — URINALYSIS, COMPLETE (UACMP) WITH MICROSCOPIC
Bacteria, UA: NONE SEEN
Glucose, UA: NEGATIVE mg/dL
Hgb urine dipstick: NEGATIVE
Ketones, ur: 40 mg/dL — AB
Leukocytes,Ua: NEGATIVE
Nitrite: NEGATIVE
Protein, ur: 30 mg/dL — AB
Specific Gravity, Urine: 1.02 (ref 1.005–1.030)
Squamous Epithelial / HPF: NONE SEEN (ref 0–5)
pH: 5.5 (ref 5.0–8.0)

## 2020-03-22 LAB — COMPREHENSIVE METABOLIC PANEL
ALT: 17 U/L (ref 0–44)
AST: 14 U/L — ABNORMAL LOW (ref 15–41)
Albumin: 4 g/dL (ref 3.5–5.0)
Alkaline Phosphatase: 56 U/L (ref 38–126)
Anion gap: 14 (ref 5–15)
BUN: 21 mg/dL (ref 8–23)
CO2: 23 mmol/L (ref 22–32)
Calcium: 9.5 mg/dL (ref 8.9–10.3)
Chloride: 98 mmol/L (ref 98–111)
Creatinine, Ser: 1.25 mg/dL — ABNORMAL HIGH (ref 0.61–1.24)
GFR, Estimated: >60 mL/min (ref >60)
Glucose, Bld: 136 mg/dL — ABNORMAL HIGH (ref 70–99)
Potassium: 4.2 mmol/L (ref 3.5–5.1)
Sodium: 135 mmol/L (ref 135–145)
Total Bilirubin: 0.8 mg/dL (ref 0.3–1.2)
Total Protein: 8.1 g/dL (ref 6.5–8.1)

## 2020-03-22 LAB — CBC WITH DIFFERENTIAL/PLATELET
Abs Immature Granulocytes: 0.07 10*3/uL (ref 0.00–0.07)
Basophils Absolute: 0.1 10*3/uL (ref 0.0–0.1)
Basophils Relative: 0 %
Eosinophils Absolute: 0.1 10*3/uL (ref 0.0–0.5)
Eosinophils Relative: 0 %
HCT: 40.8 % (ref 39.0–52.0)
Hemoglobin: 13.6 g/dL (ref 13.0–17.0)
Immature Granulocytes: 0 %
Lymphocytes Relative: 5 %
Lymphs Abs: 0.9 10*3/uL (ref 0.7–4.0)
MCH: 29.5 pg (ref 26.0–34.0)
MCHC: 33.3 g/dL (ref 30.0–36.0)
MCV: 88.5 fL (ref 80.0–100.0)
Monocytes Absolute: 2.1 10*3/uL — ABNORMAL HIGH (ref 0.1–1.0)
Monocytes Relative: 11 %
Neutro Abs: 16 10*3/uL — ABNORMAL HIGH (ref 1.7–7.7)
Neutrophils Relative %: 84 %
Platelets: 306 10*3/uL (ref 150–400)
RBC: 4.61 MIL/uL (ref 4.22–5.81)
RDW: 14 % (ref 11.5–15.5)
WBC: 19.2 10*3/uL — ABNORMAL HIGH (ref 4.0–10.5)
nRBC: 0 % (ref 0.0–0.2)

## 2020-03-22 LAB — RESP PANEL BY RT-PCR (FLU A&B, COVID) ARPGX2
Influenza A by PCR: NEGATIVE
Influenza B by PCR: NEGATIVE
SARS Coronavirus 2 by RT PCR: NEGATIVE

## 2020-03-22 NOTE — Discharge Instructions (Signed)
Rest.  Lots of fluids.  If you worsen, please let me know.  Call your PCP and/or your cancer doctor for follow up.  Take care  Dr. Lacinda Axon

## 2020-03-22 NOTE — ED Triage Notes (Signed)
Patient c/o fever and chills that started a few days ago. He also reports feeling light headed and fatigued x 2 days.

## 2020-03-22 NOTE — ED Provider Notes (Signed)
[facility]    CSN: [id number] Arrival date & time: 03/22/20  0848      History   Chief Complaint Chief Complaint  Patient presents with  . Chills  . Fatigue  . Dizziness   HPI  75 year old male presents with the above complaints.  Started 2-3 days ago.  He reports subjective fever and night sweats. Associated lightheadedness/dizziness and fatigue. No documented fever at home. No significant cough. He does note difficulty urinating.  No significant cough.  No sick contacts.  No relieving factors.  He is primarily concerned about his dizziness/lightheadedness.  No relieving factors.  No other reported symptoms.  Past Medical History:  Diagnosis Date  . Allergy   . Cancer ([place])    Larynx--carcinoma in situ  . Colon cancer ([place])   . COPD (chronic obstructive pulmonary disease) (HCC)    no inhalers  . GERD (gastroesophageal reflux disease)    no meds  . Headache    h/o migraines  . Heart murmur    as a child  . Hyperlipidemia   . Wears dentures    full upper, partial lower    Patient Active Problem List   Diagnosis Date Noted  . Urinary frequency 07/28/2019  . Goals of care, counseling/discussion 07/21/2019  . Adenocarcinoma of sigmoid colon ([place]) 06/29/2019  . Positive FIT (fecal immunochemical test)   . Polyp of colon   . Colon neoplasm   . Carcinoma in situ of larynx 11/18/2018  . Former cigarette smoker 05/01/2016  . COPD (chronic obstructive pulmonary disease) ([place]) 12/04/2015  . History of chickenpox 12/04/2015  . Hyperlipidemia, unspecified 12/04/2015  . Hypertension 12/04/2015  . Current smoker 09/30/2013  . Trigger finger 06/09/2013    Past Surgical History:  Procedure Laterality Date  . COLONOSCOPY WITH PROPOFOL N/A 06/02/2019   Procedure: COLONOSCOPY WITH PROPOFOL;  Surgeon: Virgel Manifold, MD;  Location: [facility];  Service: Endoscopy;  Laterality: N/A;  . CYSTOSCOPY N/A 06/29/2019   Procedure: CYSTOSCOPY;  Surgeon:  Hollice Espy, MD;  Location: [facility];  Service: Urology;  Laterality: N/A;  . DIRECT LARYNGOSCOPY Right 04/03/2016   Procedure: DIRECT MICROLARYNGOSCOPY WITH EXCISON RIGHT VOCAL CORD LESION;  Surgeon: Margaretha Sheffield, MD;  Location: [place];  Service: ENT;  Laterality: Right;  RIGHT   . INGUINAL HERNIA REPAIR Bilateral 12/18/2015   Procedure: LAPAROSCOPIC BILATERAL INGUINAL HERNIA REPAIR;  Surgeon: Hubbard Robinson, MD;  Location: [facility];  Service: General;  Laterality: Bilateral;  . MINOR EXCISION OF ORAL LESION Left 03/06/2016   Procedure: direct microlaryngoscopy with excision left vocal cord lesion;  Surgeon: Margaretha Sheffield, MD;  Location: [place];  Service: ENT;  Laterality: Left;  left vocal cord lesion  . TRIGGER FINGER RELEASE Left 2016  . VASECTOMY Bilateral 1978  . XI ROBOTIC ASSISTED LOWER ANTERIOR RESECTION N/A 06/29/2019   Procedure: XI ROBOTIC ASSISTED LOWER ANTERIOR RESECTION;  Surgeon: Ronny Bacon, MD;  Location: [facility];  Service: General;  Laterality: N/A;       Home Medications    Prior to Admission medications   Medication Sig Start Date End Date Taking? Authorizing Provider  acetaminophen (TYLENOL) 500 MG tablet Take 500 mg by mouth every 6 (six) hours as needed (pain.).    Yes [provider]  ALEVE PM 220-25 MG TABS Take 1 tablet by mouth daily.   Yes [provider]  beta carotene w/minerals (OCUVITE) tablet Take 1 tablet by mouth at bedtime.   Yes [provider]  bismuth subsalicylate (PEPTO BISMOL) 262 MG chewable tablet Chew 524 mg by mouth daily. Taking 1/2 tablet every morning   Yes [provider]  Cholecalciferol (VITAMIN D3) 2000 units capsule Take 2,000 Units by mouth daily.    Yes [provider]  Liniments (BLUE-EMU SUPER STRENGTH EX) Apply 1 application topically 2 (two) times daily as needed (applied to right hand as needed for soreness/pain.).   Yes [provider]  Misc  Natural Products (OSTEO BI-FLEX TRIPLE STRENGTH PO) Take 1 tablet by mouth in the morning and at bedtime.   Yes [provider]  Multiple Vitamin (MULTIVITAMIN WITH MINERALS) TABS tablet Take 1 tablet by mouth daily.   Yes [provider]  saw palmetto 160 MG capsule Take 160 mg by mouth 2 (two) times daily.   Yes [provider]  simvastatin (ZOCOR) 80 MG tablet Take 80 mg by mouth at bedtime.  11/25/15  Yes [provider]  Sodium Sulfate-Mag Sulfate-KCl (SUTAB) [phone] MG TABS At 5 PM take 12 tablets using the 8 oz cup provided in the kit drinking 5 cups of water and 5 hours before your procedure repeat the same process. 02/10/20  Yes Virgel Manifold, MD  triamcinolone (NASACORT) 55 MCG/ACT AERO nasal inhaler Place 1 spray into the nose daily.    Yes [provider]    Family History Family History  Problem Relation Age of Onset  . Lung cancer Father   . Heart disease Father   . Hypertension Father   . Polycythemia Mother   . Heart disease Mother     Social History Social History   Tobacco Use  . Smoking status: Former Smoker    Packs/day: 1.50    Years: 53.00    Pack years: 79.50    Types: Cigarettes    Quit date: 04/18/2015    Years since quitting: 4.9  . Smokeless tobacco: Former Network engineer  . Vaping Use: Never used  Substance Use Topics  . Alcohol use: Not Currently    Comment: 6-12 beers in 1 year  . Drug use: No     Allergies   Patient has no known allergies.   Review of Systems Review of Systems Per HPI  Physical Exam Triage Vital Signs ED Triage Vitals  Enc Vitals Group     BP 03/22/20 0920 112/90     Pulse Rate 03/22/20 0920 98     Resp 03/22/20 0920 18     Temp 03/22/20 0920 98.7 F (37.1 C)     Temp Source 03/22/20 0920 Oral     SpO2 03/22/20 0920 98 %     Weight 03/22/20 0919 188 lb 11.4 oz (85.6 kg)     Height 03/22/20 0919 6' 1" (1.854 m)     Head Circumference --      Peak Flow --       Pain Score 03/22/20 0919 0     Pain Loc --      Pain Edu? --      Excl. in [place]? --    Updated Vital Signs BP 112/90 (BP Location: Right Arm)   Pulse 98   Temp 98.7 F (37.1 C) (Oral)   Resp 18   Ht 6' 1" (1.854 m)   Wt 85.6 kg   SpO2 98%   BMI 24.90 kg/m   Visual Acuity Right Eye Distance:   Left Eye Distance:   Bilateral Distance:    Right Eye Near:   Left  Eye Near:    Bilateral Near:     Physical Exam Vitals and nursing note reviewed.  Constitutional:      General: He is not in acute distress.    Appearance: Normal appearance. He is not ill-appearing.  HENT:     Head: Normocephalic and atraumatic.  Eyes:     General:        Right eye: No discharge.        Left eye: No discharge.     Conjunctiva/sclera: Conjunctivae normal.  Cardiovascular:     Rate and Rhythm: Normal rate and regular rhythm.     Heart sounds: No murmur heard.   Pulmonary:     Effort: Pulmonary effort is normal.     Breath sounds: Normal breath sounds. No wheezing, rhonchi or rales.  Neurological:     Mental Status: He is alert.  Psychiatric:        Mood and Affect: Mood normal.        Behavior: Behavior normal.    UC Treatments / Results  Labs (all labs ordered are listed, but only abnormal results are displayed) Labs Reviewed  CBC WITH DIFFERENTIAL/PLATELET - Abnormal; Notable for the following components:      Result Value   WBC 19.2 (*)    Neutro Abs 16.0 (*)    Monocytes Absolute 2.1 (*)    All other components within normal limits  COMPREHENSIVE METABOLIC PANEL - Abnormal; Notable for the following components:   Glucose, Bld 136 (*)    Creatinine, Ser 1.25 (*)    AST 14 (*)    All other components within normal limits  URINALYSIS, COMPLETE (UACMP) WITH MICROSCOPIC - Abnormal; Notable for the following components:   Bilirubin Urine SMALL (*)    Ketones, ur 40 (*)    Protein, ur 30 (*)    All other components within normal limits  RESP PANEL BY RT-PCR (FLU A&B, COVID)  ARPGX2    EKG   Radiology No results found.  Procedures Procedures (including critical care time)  Medications Ordered in UC Medications - No data to display  Initial Impression / Assessment and Plan / UC Course  I have reviewed the triage vital signs and the nursing notes.  Pertinent labs & imaging results that were available during my care of the patient were reviewed by me and considered in my medical decision making (see chart for details).    75 year old male presents with subjective fever, chills, and night sweats.  Also experiencing dizziness/lightheadedness.  Well-appearing on exam.  Vital signs stable.  Laboratory studies notable for leukocytosis.  Otherwise, unremarkable.  Flu and Covid negative.  Urinalysis not consistent with UTI.  He is having no respiratory symptoms.  No compelling reason to x-ray at this time.  Advised supportive care and watchful waiting.  He is to follow-up with his PCP and/or his oncologist.  Final Clinical Impressions(s) / UC Diagnoses   Final diagnoses:  Subjective fever  Leukocytosis, unspecified type     Discharge Instructions     Rest.  Lots of fluids.  If you worsen, please let me know.  Call your PCP and/or your cancer doctor for follow up.  Take care  Dr. Lacinda Axon     ED Prescriptions    None     PDMP not reviewed this encounter.   Coral Spikes, [place] 03/22/20 1054

## 2020-03-26 DIAGNOSIS — R197 Diarrhea, unspecified: Secondary | ICD-10-CM | POA: Diagnosis not present

## 2020-03-26 DIAGNOSIS — J189 Pneumonia, unspecified organism: Secondary | ICD-10-CM | POA: Diagnosis not present

## 2020-03-26 DIAGNOSIS — R5383 Other fatigue: Secondary | ICD-10-CM | POA: Diagnosis not present

## 2020-03-26 DIAGNOSIS — R0602 Shortness of breath: Secondary | ICD-10-CM | POA: Diagnosis not present

## 2020-04-01 ENCOUNTER — Inpatient Hospital Stay
Admission: EM | Admit: 2020-04-01 | Discharge: 2020-04-03 | DRG: 871 | Disposition: A | Discharge status: Home / Self Care | Payer: Medicare PPO | Attending: Internal Medicine | Admitting: Internal Medicine

## 2020-04-01 ENCOUNTER — Other Ambulatory Visit: Payer: Self-pay

## 2020-04-01 ENCOUNTER — Emergency Department: Payer: Medicare PPO

## 2020-04-01 ENCOUNTER — Encounter: Payer: Self-pay | Admitting: Emergency Medicine

## 2020-04-01 DIAGNOSIS — R531 Weakness: Secondary | ICD-10-CM | POA: Diagnosis not present

## 2020-04-01 DIAGNOSIS — Z801 Family history of malignant neoplasm of trachea, bronchus and lung: Secondary | ICD-10-CM | POA: Diagnosis not present

## 2020-04-01 DIAGNOSIS — Z8249 Family history of ischemic heart disease and other diseases of the circulatory system: Secondary | ICD-10-CM | POA: Diagnosis not present

## 2020-04-01 DIAGNOSIS — K769 Liver disease, unspecified: Secondary | ICD-10-CM | POA: Diagnosis not present

## 2020-04-01 DIAGNOSIS — I10 Essential (primary) hypertension: Secondary | ICD-10-CM

## 2020-04-01 DIAGNOSIS — K219 Gastro-esophageal reflux disease without esophagitis: Secondary | ICD-10-CM | POA: Diagnosis not present

## 2020-04-01 DIAGNOSIS — E785 Hyperlipidemia, unspecified: Secondary | ICD-10-CM

## 2020-04-01 DIAGNOSIS — J189 Pneumonia, unspecified organism: Secondary | ICD-10-CM | POA: Diagnosis not present

## 2020-04-01 DIAGNOSIS — J159 Unspecified bacterial pneumonia: Secondary | ICD-10-CM | POA: Diagnosis not present

## 2020-04-01 DIAGNOSIS — Z87891 Personal history of nicotine dependence: Secondary | ICD-10-CM

## 2020-04-01 DIAGNOSIS — R599 Enlarged lymph nodes, unspecified: Secondary | ICD-10-CM

## 2020-04-01 DIAGNOSIS — A419 Sepsis, unspecified organism: Principal | ICD-10-CM

## 2020-04-01 DIAGNOSIS — J449 Chronic obstructive pulmonary disease, unspecified: Secondary | ICD-10-CM | POA: Diagnosis not present

## 2020-04-01 DIAGNOSIS — D72828 Other elevated white blood cell count: Secondary | ICD-10-CM | POA: Diagnosis not present

## 2020-04-01 DIAGNOSIS — T380X5A Adverse effect of glucocorticoids and synthetic analogues, initial encounter: Secondary | ICD-10-CM | POA: Diagnosis present

## 2020-04-01 DIAGNOSIS — J440 Chronic obstructive pulmonary disease with acute lower respiratory infection: Secondary | ICD-10-CM | POA: Diagnosis not present

## 2020-04-01 DIAGNOSIS — Z20822 Contact with and (suspected) exposure to covid-19: Secondary | ICD-10-CM | POA: Diagnosis not present

## 2020-04-01 DIAGNOSIS — Z8521 Personal history of malignant neoplasm of larynx: Secondary | ICD-10-CM | POA: Diagnosis not present

## 2020-04-01 DIAGNOSIS — J90 Pleural effusion, not elsewhere classified: Secondary | ICD-10-CM | POA: Diagnosis not present

## 2020-04-01 DIAGNOSIS — Z85038 Personal history of other malignant neoplasm of large intestine: Secondary | ICD-10-CM

## 2020-04-01 DIAGNOSIS — Z79899 Other long term (current) drug therapy: Secondary | ICD-10-CM

## 2020-04-01 DIAGNOSIS — D75839 Thrombocytosis, unspecified: Secondary | ICD-10-CM | POA: Diagnosis not present

## 2020-04-01 DIAGNOSIS — C187 Malignant neoplasm of sigmoid colon: Secondary | ICD-10-CM | POA: Diagnosis present

## 2020-04-01 DIAGNOSIS — R590 Localized enlarged lymph nodes: Secondary | ICD-10-CM | POA: Diagnosis not present

## 2020-04-01 DIAGNOSIS — R6520 Severe sepsis without septic shock: Secondary | ICD-10-CM | POA: Diagnosis not present

## 2020-04-01 DIAGNOSIS — R0602 Shortness of breath: Secondary | ICD-10-CM | POA: Diagnosis not present

## 2020-04-01 LAB — BRAIN NATRIURETIC PEPTIDE: B Natriuretic Peptide: 68.6 pg/mL (ref 0.0–100.0)

## 2020-04-01 LAB — COMPREHENSIVE METABOLIC PANEL
ALT: 25 U/L (ref 0–44)
AST: 14 U/L — ABNORMAL LOW (ref 15–41)
Albumin: 3.2 g/dL — ABNORMAL LOW (ref 3.5–5.0)
Alkaline Phosphatase: 65 U/L (ref 38–126)
Anion gap: 12 (ref 5–15)
BUN: 18 mg/dL (ref 8–23)
CO2: 26 mmol/L (ref 22–32)
Calcium: 9 mg/dL (ref 8.9–10.3)
Chloride: 97 mmol/L — ABNORMAL LOW (ref 98–111)
Creatinine, Ser: 1.06 mg/dL (ref 0.61–1.24)
GFR, Estimated: >60 mL/min (ref >60)
Glucose, Bld: 134 mg/dL — ABNORMAL HIGH (ref 70–99)
Potassium: 4 mmol/L (ref 3.5–5.1)
Sodium: 135 mmol/L (ref 135–145)
Total Bilirubin: 0.5 mg/dL (ref 0.3–1.2)
Total Protein: 7.5 g/dL (ref 6.5–8.1)

## 2020-04-01 LAB — CBC WITH DIFFERENTIAL/PLATELET
Abs Immature Granulocytes: 0.41 10*3/uL — ABNORMAL HIGH (ref 0.00–0.07)
Basophils Absolute: 0.1 10*3/uL (ref 0.0–0.1)
Basophils Relative: 0 %
Eosinophils Absolute: 0.1 10*3/uL (ref 0.0–0.5)
Eosinophils Relative: 0 %
HCT: 39.4 % (ref 39.0–52.0)
Hemoglobin: 12.9 g/dL — ABNORMAL LOW (ref 13.0–17.0)
Immature Granulocytes: 2 %
Lymphocytes Relative: 6 %
Lymphs Abs: 1.5 10*3/uL (ref 0.7–4.0)
MCH: 29.2 pg (ref 26.0–34.0)
MCHC: 32.7 g/dL (ref 30.0–36.0)
MCV: 89.1 fL (ref 80.0–100.0)
Monocytes Absolute: 1.8 10*3/uL — ABNORMAL HIGH (ref 0.1–1.0)
Monocytes Relative: 8 %
Neutro Abs: 19.8 10*3/uL — ABNORMAL HIGH (ref 1.7–7.7)
Neutrophils Relative %: 84 %
Platelets: 735 10*3/uL — ABNORMAL HIGH (ref 150–400)
RBC: 4.42 MIL/uL (ref 4.22–5.81)
RDW: 13.8 % (ref 11.5–15.5)
WBC: 23.7 10*3/uL — ABNORMAL HIGH (ref 4.0–10.5)
nRBC: 0 % (ref 0.0–0.2)

## 2020-04-01 LAB — LIPASE, BLOOD: Lipase: 26 U/L (ref 11–51)

## 2020-04-01 LAB — PROCALCITONIN: Procalcitonin: 0.13 ng/mL

## 2020-04-01 LAB — LACTIC ACID, PLASMA: Lactic Acid, Venous: 0.9 mmol/L (ref 0.5–1.9)

## 2020-04-01 LAB — TROPONIN I (HIGH SENSITIVITY): Troponin I (High Sensitivity): 7 ng/L (ref <18)

## 2020-04-01 MED ORDER — ALBUTEROL SULFATE HFA 108 (90 BASE) MCG/ACT IN AERS
2.0000 | INHALATION_SPRAY | RESPIRATORY_TRACT | Status: DC | PRN
  Filled 2020-04-01: qty 6.7

## 2020-04-01 MED ORDER — BISMUTH SUBSALICYLATE 262 MG PO CHEW
524.0000 mg | CHEWABLE_TABLET | Freq: Every day | ORAL | Status: DC | PRN
  Filled 2020-04-01: qty 2

## 2020-04-01 MED ORDER — ATORVASTATIN CALCIUM 20 MG PO TABS
40.0000 mg | ORAL_TABLET | Freq: Every day | ORAL | Status: DC
  Administered 2020-04-01 – 2020-04-03 (×3): 40 mg via ORAL
  Filled 2020-04-01 (×3): qty 2

## 2020-04-01 MED ORDER — OSTEO BI-FLEX TRIPLE STRENGTH PO TABS: 1.0000 | ORAL_TABLET | Freq: Every day | ORAL | Status: DC

## 2020-04-01 MED ORDER — ENOXAPARIN SODIUM 40 MG/0.4ML ~~LOC~~ SOLN
40.0000 mg | SUBCUTANEOUS | Status: DC
  Administered 2020-04-01 – 2020-04-02 (×2): 40 mg via SUBCUTANEOUS
  Filled 2020-04-01 (×2): qty 0.4

## 2020-04-01 MED ORDER — DM-GUAIFENESIN ER 30-600 MG PO TB12: 1.0000 | ORAL_TABLET | Freq: Two times a day (BID) | ORAL | Status: DC | PRN

## 2020-04-01 MED ORDER — IPRATROPIUM BROMIDE HFA 17 MCG/ACT IN AERS
2.0000 | INHALATION_SPRAY | Freq: Four times a day (QID) | RESPIRATORY_TRACT | Status: DC
  Administered 2020-04-01 – 2020-04-03 (×7): 2 via RESPIRATORY_TRACT
  Filled 2020-04-01 (×2): qty 12.9

## 2020-04-01 MED ORDER — ACETAMINOPHEN 325 MG PO TABS
650.0000 mg | ORAL_TABLET | Freq: Four times a day (QID) | ORAL | Status: DC | PRN
  Administered 2020-04-02 – 2020-04-03 (×3): 650 mg via ORAL
  Filled 2020-04-01 (×3): qty 2

## 2020-04-01 MED ORDER — IOHEXOL 350 MG/ML SOLN
75.0000 mL | Freq: Once | INTRAVENOUS | Status: AC | PRN
  Administered 2020-04-01: 75 mL via INTRAVENOUS

## 2020-04-01 MED ORDER — VITAMIN D 25 MCG (1000 UNIT) PO TABS
2000.0000 [IU] | ORAL_TABLET | Freq: Every day | ORAL | Status: DC
  Administered 2020-04-02 – 2020-04-03 (×2): 2000 [IU] via ORAL
  Filled 2020-04-01 (×2): qty 2

## 2020-04-01 MED ORDER — VANCOMYCIN HCL IN DEXTROSE 1-5 GM/200ML-% IV SOLN
1000.0000 mg | Freq: Two times a day (BID) | INTRAVENOUS | Status: DC
  Administered 2020-04-02 – 2020-04-03 (×3): 1000 mg via INTRAVENOUS
  Filled 2020-04-01 (×6): qty 200

## 2020-04-01 MED ORDER — NAPROXEN 250 MG PO TABS
250.0000 mg | ORAL_TABLET | Freq: Every day | ORAL | Status: DC | PRN
  Filled 2020-04-01: qty 1

## 2020-04-01 MED ORDER — ADULT MULTIVITAMIN W/MINERALS CH
1.0000 | ORAL_TABLET | Freq: Every day | ORAL | Status: DC
  Administered 2020-04-02 – 2020-04-03 (×2): 1 via ORAL
  Filled 2020-04-01 (×2): qty 1

## 2020-04-01 MED ORDER — OCUVITE-LUTEIN PO CAPS
1.0000 | ORAL_CAPSULE | Freq: Every day | ORAL | Status: DC
  Administered 2020-04-01 – 2020-04-02 (×2): 1 via ORAL
  Filled 2020-04-01 (×3): qty 1

## 2020-04-01 MED ORDER — VANCOMYCIN HCL 2000 MG/400ML IV SOLN
2000.0000 mg | Freq: Once | INTRAVENOUS | Status: AC
  Administered 2020-04-01: 2000 mg via INTRAVENOUS
  Filled 2020-04-01: qty 400

## 2020-04-01 MED ORDER — TRIAMCINOLONE ACETONIDE 55 MCG/ACT NA AERO
1.0000 | INHALATION_SPRAY | Freq: Every day | NASAL | Status: DC
  Administered 2020-04-02 – 2020-04-03 (×2): 1 via NASAL
  Filled 2020-04-01: qty 10.8

## 2020-04-01 MED ORDER — HYDRALAZINE HCL 20 MG/ML IJ SOLN: 5.0000 mg | INTRAMUSCULAR | Status: DC | PRN

## 2020-04-01 MED ORDER — SODIUM CHLORIDE 0.9 % IV SOLN
2.0000 g | Freq: Three times a day (TID) | INTRAVENOUS | Status: DC
  Administered 2020-04-01 – 2020-04-03 (×6): 2 g via INTRAVENOUS
  Filled 2020-04-01 (×8): qty 2

## 2020-04-01 MED ORDER — ONDANSETRON HCL 4 MG/2ML IJ SOLN: 4.0000 mg | Freq: Three times a day (TID) | INTRAMUSCULAR | Status: DC | PRN

## 2020-04-01 NOTE — Consult Note (Signed)
PHARMACY -  BRIEF ANTIBIOTIC NOTE   Pharmacy has received consult(s) for vancomycin from an ED provider.  The patient's profile has been reviewed for ht/wt/allergies/indication/available labs.    One time order(s) placed for vancomycin 2000 mg  Further antibiotics/pharmacy consults should be ordered by admitting physician if indicated.                       Thank you, Darnelle Bos, PharmD 04/01/2020  2:32 PM

## 2020-04-01 NOTE — Progress Notes (Signed)
Notified bedside nurse of need to administer antibiotics.  Pt transferred to room without all abx given. Clarified with floor bedside RN that patient was going to still be given his maxipime. RN is waiting for it to arrive from pharmacy and then will give.

## 2020-04-01 NOTE — H&P (Addendum)
History and Physical    Kirk Rodriguez ZMO:[id number] DOB: [birth year] DOA: 04/01/2020  Referring MD/NP/PA:   PCP: Barbaraann Boys, MD   Patient coming from:  The patient is coming from home.  At baseline, pt is independent for most of ADL.        Chief Complaint: Shortness of breath, weakness  HPI: Kirk Rodriguez is a 75 y.o. 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  Patient states that he has been having shortness breath, cough for almost 2 weeks.  Patient was seen in urgent care on 3/21, and diagnosed with pneumonia. He was started on Levaquin and steroid.  Patient states that he is taking these medications, but no improvement.  His symptoms has been progressively worsening. Patient does not have chest pain, fever or chills.  He states that he had diarrhea recently, which has resolved.  Currently no nausea, vomiting, diarrhea or abdominal pain.  No symptoms of UTI. He has generalized weakness, poor appetite and decreased oral intake.  ED Course: pt was found to have WBC 23.7, lactic acid of 0.9, troponin level 7, BNP 68, lipase 26, electrolytes renal function okay, temperature normal, blood pressure 113/76, heart rate 99, 88, RR 24, oxygen saturation 97% on room air.  CT angiogram negative for PE, but showed left upper lobe and lingula infiltration.  Also showed possible 9 mm lesion in the left lobe of liver.  Patient is admitted to [facility] [place] bed as inpatient.   Review of Systems:   General: no fevers, chills, no body weight gain, has poor appetite, has fatigue HEENT: no blurry vision, hearing changes or sore throat Respiratory: has dyspnea, coughing, no wheezing CV: no chest pain, no palpitations GI: no nausea, vomiting, abdominal pain, diarrhea,  constipation GU: no dysuria, burning on urination, increased urinary frequency, hematuria  Ext: no leg edema Neuro: no unilateral weakness, numbness, or tingling, no vision change or hearing loss Skin: no rash, no skin tear. MSK: No muscle spasm, no deformity, no limitation of range of movement in spin Heme: No easy bruising.  Travel history: No recent long distant travel.  Allergy: No Known Allergies  Past Medical History:  Diagnosis Date  . Allergy   . Cancer ([place])    Larynx--carcinoma in situ  . Colon cancer ([place])   . COPD (chronic obstructive pulmonary disease) (HCC)    no inhalers  . GERD (gastroesophageal reflux disease)    no meds  . Headache    h/o migraines  . Heart murmur    as a child  . Hyperlipidemia   . Wears dentures    full upper, partial lower    Past Surgical History:  Procedure Laterality Date  . COLONOSCOPY WITH PROPOFOL N/A 06/02/2019   Procedure: COLONOSCOPY WITH PROPOFOL;  Surgeon: Virgel Manifold, MD;  Location: [facility];  Service: Endoscopy;  Laterality: N/A;  . CYSTOSCOPY N/A 06/29/2019   Procedure: CYSTOSCOPY;  Surgeon: Hollice Espy, MD;  Location: [facility];  Service: Urology;  Laterality: N/A;  . DIRECT LARYNGOSCOPY Right 04/03/2016   Procedure: DIRECT MICROLARYNGOSCOPY WITH EXCISON RIGHT VOCAL CORD LESION;  Surgeon: Margaretha Sheffield, MD;  Location: [place];  Service: ENT;  Laterality: Right;  RIGHT   . INGUINAL HERNIA REPAIR Bilateral 12/18/2015   Procedure: LAPAROSCOPIC BILATERAL INGUINAL HERNIA REPAIR;  Surgeon: Barnetta Chapel  Starla Link, MD;  Location: [facility];  Service: General;  Laterality: Bilateral;  . MINOR EXCISION OF ORAL LESION Left 03/06/2016   Procedure: direct microlaryngoscopy with excision left vocal cord lesion;  Surgeon: Margaretha Sheffield, MD;  Location: [place];  Service: ENT;  Laterality: Left;  left vocal cord lesion  . TRIGGER FINGER RELEASE Left 2016  . VASECTOMY Bilateral 1978  . XI ROBOTIC ASSISTED  LOWER ANTERIOR RESECTION N/A 06/29/2019   Procedure: XI ROBOTIC ASSISTED LOWER ANTERIOR RESECTION;  Surgeon: Ronny Bacon, MD;  Location: [facility];  Service: General;  Laterality: N/A;    Social History:  reports that he quit smoking about 4 years ago. His smoking use included cigarettes. He has a 79.50 pack-year smoking history. He has quit using smokeless tobacco. He reports previous alcohol use. He reports that he does not use drugs.  Family History:  Family History  Problem Relation Age of Onset  . Lung cancer Father   . Heart disease Father   . Hypertension Father   . Polycythemia Mother   . Heart disease Mother      Prior to Admission medications   Medication Sig Start Date End Date Taking? Authorizing Provider  acetaminophen (TYLENOL) 500 MG tablet Take 500 mg by mouth every 6 (six) hours as needed (pain.).     [provider]  ALEVE PM 220-25 MG TABS Take 1 tablet by mouth daily.    [provider]  beta carotene w/minerals (OCUVITE) tablet Take 1 tablet by mouth at bedtime.    [provider]  bismuth subsalicylate (PEPTO BISMOL) 262 MG chewable tablet Chew 524 mg by mouth daily. Taking 1/2 tablet every morning    [provider]  Cholecalciferol (VITAMIN D3) 2000 units capsule Take 2,000 Units by mouth daily.     [provider]  Liniments (BLUE-EMU SUPER STRENGTH EX) Apply 1 application topically 2 (two) times daily as needed (applied to right hand as needed for soreness/pain.).    [provider]  Misc Natural Products (OSTEO BI-FLEX TRIPLE STRENGTH PO) Take 1 tablet by mouth in the morning and at bedtime.    [provider]  Multiple Vitamin (MULTIVITAMIN WITH MINERALS) TABS tablet Take 1 tablet by mouth daily.    [provider]  saw palmetto 160 MG capsule Take 160 mg by mouth 2 (two) times daily.    [provider]  simvastatin (ZOCOR) 80 MG tablet Take 80 mg by mouth at bedtime.  11/25/15    [provider]  Sodium Sulfate-Mag Sulfate-KCl (SUTAB) [phone] MG TABS At 5 PM take 12 tablets using the 8 oz cup provided in the kit drinking 5 cups of water and 5 hours before your procedure repeat the same process. 02/10/20   Virgel Manifold, MD  triamcinolone (NASACORT) 55 MCG/ACT AERO nasal inhaler Place 1 spray into the nose daily.     [provider]    Physical Exam: Vitals:   04/01/20 1100 04/01/20 1351 04/01/20 1641 04/01/20 1642  BP: 118/81 113/76 122/76   Pulse: 80 88 100 (!) 103  Resp: 17 (!) 24 16   Temp:   99 F (37.2 C)   TempSrc:      SpO2: 97% 99% 90% 100%  Weight:      Height:       General: Not in acute distress HEENT:       Eyes: PERRL, EOMI, no scleral icterus.       ENT: No discharge from the ears  and nose, no pharynx injection, no tonsillar enlargement.        Neck: No JVD, no bruit, no mass felt. Heme: No neck lymph node enlargement. Cardiac: S1/S2, RRR, No gallops or rubs. Respiratory: Has coarse breathing sound bilaterally GI: Soft, nondistended, nontender, no rebound pain, no organomegaly, BS present. GU: No hematuria Ext: No pitting leg edema bilaterally. 1+DP/PT pulse bilaterally. Musculoskeletal: No joint deformities, No joint redness or warmth, no limitation of ROM in spin. Skin: No rashes.  Neuro: Alert, oriented X3, cranial nerves II-XII grossly intact, moves all extremities normally. Psych: Patient is not psychotic, no suicidal or hemocidal ideation.  Labs on Admission: I have personally reviewed following labs and imaging studies  CBC: Recent Labs  Lab 04/01/20 1038  WBC 23.7*  NEUTROABS 19.8*  HGB 12.9*  HCT 39.4  MCV 89.1  PLT 224*   Basic Metabolic Panel: Recent Labs  Lab 04/01/20 1038  NA 135  K 4.0  CL 97*  CO2 26  GLUCOSE 134*  BUN 18  CREATININE 1.06  CALCIUM 9.0   GFR: Estimated Creatinine Clearance: 69.1 mL/min (by C-G formula based on SCr of 1.06 mg/dL). Liver Function Tests: Recent  Labs  Lab 04/01/20 1038  AST 14*  ALT 25  ALKPHOS 65  BILITOT 0.5  PROT 7.5  ALBUMIN 3.2*   Recent Labs  Lab 04/01/20 1038  LIPASE 26   No results for input(s): AMMONIA in the last 168 hours. Coagulation Profile: No results for input(s): INR, PROTIME in the last 168 hours. Cardiac Enzymes: No results for input(s): CKTOTAL, CKMB, CKMBINDEX, TROPONINI in the last 168 hours. BNP (last 3 results) No results for input(s): PROBNP in the last 8760 hours. HbA1C: No results for input(s): HGBA1C in the last 72 hours. CBG: No results for input(s): GLUCAP in the last 168 hours. Lipid Profile: No results for input(s): CHOL, HDL, LDLCALC, TRIG, CHOLHDL, LDLDIRECT in the last 72 hours. Thyroid Function Tests: No results for input(s): TSH, T4TOTAL, FREET4, T3FREE, THYROIDAB in the last 72 hours. Anemia Panel: No results for input(s): VITAMINB12, FOLATE, FERRITIN, TIBC, IRON, RETICCTPCT in the last 72 hours. Urine analysis:    Component Value Date/Time   COLORURINE YELLOW 03/22/2020 1009   APPEARANCEUR CLEAR 03/22/2020 1009   APPEARANCEUR Clear 08/17/2019 0920   LABSPEC 1.020 03/22/2020 1009   PHURINE 5.5 03/22/2020 1009   GLUCOSEU NEGATIVE 03/22/2020 1009   HGBUR NEGATIVE 03/22/2020 1009   BILIRUBINUR SMALL (A) 03/22/2020 1009   BILIRUBINUR Negative 08/17/2019 0920   KETONESUR 40 (A) 03/22/2020 1009   PROTEINUR 30 (A) 03/22/2020 1009   NITRITE NEGATIVE 03/22/2020 1009   LEUKOCYTESUR NEGATIVE 03/22/2020 1009   Sepsis Labs: _0 (procalcitonin:4,lacticidven:4) )No results found for this or any previous visit (from the past 240 hour(s)).   Radiological Exams on Admission: CT Angio Chest PE W and/or Wo Contrast  Result Date: 04/01/2020 CLINICAL DATA:  Increased shortness of breath, recent diagnosis of pneumonia, generalized weakness, increasing difficulty walking short distances, on antibiotics and steroids, decreased appetite. Past history of colon cancer, laryngeal cancer,  COPD, GERD, former smoker EXAM: CT ANGIOGRAPHY CHEST WITH CONTRAST TECHNIQUE: Multidetector CT imaging of the chest was performed using the standard protocol during bolus administration of intravenous contrast. Multiplanar CT image reconstructions and MIPs were obtained to evaluate the vascular anatomy. CONTRAST:  79m OMNIPAQUE IOHEXOL 350 MG/ML SOLN IV COMPARISON:  06/17/2019 FINDINGS: Cardiovascular: Atherosclerotic calcifications aorta, coronary arteries and proximal great vessels. Aorta normal caliber without aneurysm or dissection. Heart unremarkable. No pericardial effusion. Pulmonary  arteries adequately opacified and patent. No evidence of pulmonary embolism. Mediastinum/Nodes: Esophagus unremarkable. Base of cervical region normal appearance. Enlarged AP window lymph node 15 mm image 38 previously 8 mm. Additional enlarged AP window lymph node 11 mm image 36. Multiple additional normal sized mediastinal and LEFT hilar nodes. Lungs/Pleura: Severe emphysematous changes. Minimal scarring in RIGHT upper lobe. Extensive consolidation in LEFT upper lobe and lingula LEFT lower lobe clear. Minimal LEFT pleural effusion. No pneumothorax. Consistent with pneumonia. Upper Abdomen: Questionable low-attenuation focus LEFT lobe liver 9 mm diameter image 85. Stomach decompressed with inadequate assessment of wall thickness. Remaining upper abdomen unremarkable. Musculoskeletal: No acute osseous findings. Review of the MIP images confirms the above findings. IMPRESSION: No evidence of pulmonary embolism. Emphysematous changes consistent with COPD. Extensive consolidation involving the LEFT upper lobe and lingula consistent with pneumonia. Mildly prominent AP window lymph nodes, potentially reactive in the setting of pneumonia. Questionable nonspecific 9 mm low-attenuation LEFT lobe liver. Recommend attention to AP window lymph nodes and questionable liver lesion on follow-up imaging. Aortic Atherosclerosis (ICD10-I70.0)  and Emphysema (ICD10-J43.9). Electronically Signed   By: Lavonia Dana M.D.   On: 04/01/2020 13:53     EKG: Not done in ED, will get one.   Assessment/Plan Principal Problem:   CAP (community acquired pneumonia) Active Problems:   COPD (chronic obstructive pulmonary disease) (HCC)   Hyperlipidemia, unspecified   Hypertension   Adenocarcinoma of sigmoid colon ([place])   Sepsis ([place])   Liver lesion   Sepsis due to CAP (community acquired pneumonia): Patient meets criteria for sepsis with leukocytosis with WBC 23.7, tachycardia with heart rate of 99, tachypnea with RR 24.  Lactic acid is normal.  Currently hemodynamically stable.  His leukocytosis is partially due to steroid use.  Patient failed outpatient Levaquin treatment for pneumonia.  Will start broad coverage.  - Will admit to med-surg bed as inpt - IV Vancomycin and cefepime, - Mucinex for cough  - Bronchodilators - Urine legionella and S. pneumococcal antigen - Follow up blood culture x2, sputum culture - will get Procalcitonin - IVF: will not give IV due to normal lactic acid  COPD (chronic obstructive pulmonary disease) ([place]): -Bronchodilators as above  Hyperlipidemia, unspecified -lipitor  Hypertension: Blood pressure 113/76, patient is not taking medications currently -IV hydralazine as needed  Liver lesion and hx of adenocarcinoma of sigmoid colon: CTA incidentally showed questionable nonspecific 9 mm low-attenuation LEFT lobe liver. -need to r/o with Dr. Janese Banks of oncology due to history of colon cancer    DVT ppx:  SQ Lovenox Code Status: Full code Family Communication: I called his wife by phone Disposition Plan:  Anticipate discharge back to previous environment Consults called:  none Admission status and Level of care: Med-Surg:     as inpt      Status is: Inpatient  Remains inpatient appropriate because:Inpatient level of care appropriate due to severity of illness   Dispo: The patient is from: Home               Anticipated d/c is to: Home              Patient currently is not medically stable to d/c.   Difficult to place patient No          Date of Service 04/01/2020    [facility]   If 7PM-7AM, please contact night-coverage [email or web address] 04/01/2020, 4:57 PM

## 2020-04-01 NOTE — ED Triage Notes (Signed)
Pt to ED via POV, c/o generalized weakness and SOB, states was dx with pneumonia on Monday. Pt states he is usually active and going, states has had increasing difficulty walking short distances. Pt states has been taking abx and steroids as prescribed, also c/o decreased appetite since starting abx and steroids.

## 2020-04-01 NOTE — ED Provider Notes (Signed)
[facility] Emergency Department Provider Note   ____________________________________________   Event Date/Time   First MD Initiated Contact with Patient 04/01/20 1003     (approximate)  I have reviewed the triage vital signs and the nursing notes.   HISTORY  Chief Complaint Weakness    HPI Kirk Rodriguez is a 75 y.o. male history of laryngeal cancer COPD recent diagnosis pneumonia  Patient was seen twice at urgent care, started on Levaquin a few days ago as well as prednisone and albuterol.  He has been utilizing those but only uses inhaler once, but reports he continues to feel increased shortness of breath fatigue and generalized low energy.  No chest pain.  No fevers no chills.  Slight cough.  Denies any wheezing.  Reports he just has a feeling of these not improving or getting a little bit worse day over day.  Still eating and drinking but feeling fatigued     Past Medical History:  Diagnosis Date  . Allergy   . Cancer ([place])    Larynx--carcinoma in situ  . Colon cancer ([place])   . COPD (chronic obstructive pulmonary disease) ([facility])    no inhalers  . GERD (gastroesophageal reflux disease)    no meds  . Headache    h/o migraines  . Heart murmur    as a child  . Hyperlipidemia   . Wears dentures    full upper, partial lower    Patient Active Problem List   Diagnosis Date Noted  . CAP (community acquired pneumonia) 04/01/2020  . Sepsis ([place]) 04/01/2020  . Liver lesion 04/01/2020  . Urinary frequency 07/28/2019  . Goals of care, counseling/discussion 07/21/2019  . Adenocarcinoma of sigmoid colon ([place]) 06/29/2019  . Positive FIT (fecal immunochemical test)   . Polyp of colon   . Colon neoplasm   . Carcinoma in situ of larynx 11/18/2018  . Former cigarette smoker 05/01/2016  . COPD (chronic obstructive pulmonary disease) ([place]) 12/04/2015  . History of chickenpox 12/04/2015  . Hyperlipidemia, unspecified 12/04/2015  . Hypertension  12/04/2015  . Current smoker 09/30/2013  . Trigger finger 06/09/2013    Past Surgical History:  Procedure Laterality Date  . COLONOSCOPY WITH PROPOFOL N/A 06/02/2019   Procedure: COLONOSCOPY WITH PROPOFOL;  Surgeon: Virgel Manifold, MD;  Location: [facility] ENDOSCOPY;  Service: Endoscopy;  Laterality: N/A;  . CYSTOSCOPY N/A 06/29/2019   Procedure: CYSTOSCOPY;  Surgeon: Hollice Espy, MD;  Location: [facility] ORS;  Service: Urology;  Laterality: N/A;  . DIRECT LARYNGOSCOPY Right 04/03/2016   Procedure: DIRECT MICROLARYNGOSCOPY WITH EXCISON RIGHT VOCAL CORD LESION;  Surgeon: Margaretha Sheffield, MD;  Location: [place];  Service: ENT;  Laterality: Right;  RIGHT   . INGUINAL HERNIA REPAIR Bilateral 12/18/2015   Procedure: LAPAROSCOPIC BILATERAL INGUINAL HERNIA REPAIR;  Surgeon: Hubbard Robinson, MD;  Location: [facility] ORS;  Service: General;  Laterality: Bilateral;  . MINOR EXCISION OF ORAL LESION Left 03/06/2016   Procedure: direct microlaryngoscopy with excision left vocal cord lesion;  Surgeon: Margaretha Sheffield, MD;  Location: [place];  Service: ENT;  Laterality: Left;  left vocal cord lesion  . TRIGGER FINGER RELEASE Left 2016  . VASECTOMY Bilateral 1978  . XI ROBOTIC ASSISTED LOWER ANTERIOR RESECTION N/A 06/29/2019   Procedure: XI ROBOTIC ASSISTED LOWER ANTERIOR RESECTION;  Surgeon: Ronny Bacon, MD;  Location: [facility] OR[facility];  Service: General;  Laterality: N/A;    Prior to Admission medications   Medication Sig Start Date End Date Taking? Authorizing Provider  acetaminophen (TYLENOL) 500 MG tablet Take 500 mg by mouth every 6 (six) hours as needed (pain.).    Yes [provider]  albuterol (VENTOLIN HFA) 108 (90 Base) MCG/ACT inhaler Inhale 2 puffs into the lungs every 6 (six) hours as needed for wheezing or shortness of breath. 03/26/20  Yes [provider]  beta carotene w/minerals (OCUVITE) tablet Take 1 tablet by mouth at bedtime.   Yes [provider]   Cholecalciferol (VITAMIN D3) 2000 units capsule Take 2,000 Units by mouth daily.    Yes [provider]  levofloxacin (LEVAQUIN) 500 MG tablet Take 500 mg by mouth daily. 03/26/20 04/02/20 Yes [provider]  Misc Natural Products (OSTEO BI-FLEX TRIPLE STRENGTH PO) Take 1 tablet by mouth in the morning and at bedtime.   Yes [provider]  Multiple Vitamin (MULTIVITAMIN WITH MINERALS) TABS tablet Take 1 tablet by mouth daily.   Yes [provider]  saw palmetto 160 MG capsule Take 160 mg by mouth 2 (two) times daily.   Yes [provider]  simvastatin (ZOCOR) 80 MG tablet Take 80 mg by mouth at bedtime.  11/25/15  Yes [provider]  triamcinolone (NASACORT) 55 MCG/ACT AERO nasal inhaler Place 1 spray into the nose daily.    Yes [provider]  ALEVE PM 220-25 MG TABS Take 1 tablet by mouth daily.    [provider]  benzonatate (TESSALON) 200 MG capsule Take 200 mg by mouth 3 (three) times daily as needed for cough. 03/26/20   [provider]  bismuth subsalicylate (PEPTO BISMOL) 262 MG chewable tablet Chew 524 mg by mouth daily. Taking 1/2 tablet every morning    [provider]  Liniments (BLUE-EMU SUPER STRENGTH EX) Apply 1 application topically 2 (two) times daily as needed (applied to right hand as needed for soreness/pain.).    [provider]  predniSONE (DELTASONE) 10 MG tablet Take 10 mg by mouth daily. Patient not taking: No sig reported 03/26/20   [provider]  Sodium Sulfate-Mag Sulfate-KCl (SUTAB) [phone] MG TABS At 5 PM take 12 tablets using the 8 oz cup provided in the kit drinking 5 cups of water and 5 hours before your procedure repeat the same process. Patient not taking: No sig reported 02/10/20   Virgel Manifold, MD    Allergies Patient has no known allergies.  Family History  Problem Relation Age of Onset  . Lung cancer Father   . Heart disease Father    . Hypertension Father   . Polycythemia Mother   . Heart disease Mother     Social History Social History   Tobacco Use  . Smoking status: Former Smoker    Packs/day: 1.50    Years: 53.00    Pack years: 79.50    Types: Cigarettes    Quit date: 04/18/2015    Years since quitting: 4.9  . Smokeless tobacco: Former Network engineer  . Vaping Use: Never used  Substance Use Topics  . Alcohol use: Not Currently    Comment: 6-12 beers in 1 year  . Drug use: No    Review of Systems Constitutional: No fever/chills Eyes: No visual changes. ENT: No sore throat. Cardiovascular: Denies chest pain. Respiratory: See HPI Gastrointestinal: No abdominal pain.   Genitourinary: Had urine specimen at urgent care was normal, denies symptoms Musculoskeletal: Negative for back pain. Skin: Negative for rash. Neurological: Negative for headaches, areas of focal weakness or numbness.    ____________________________________________  PHYSICAL EXAM:  VITAL SIGNS: ED Triage Vitals  Enc Vitals Group     BP 04/01/20 0951 133/90     Pulse Rate 04/01/20 0951 99     Resp 04/01/20 0951 16     Temp 04/01/20 0951 98 F (36.7 C)     Temp Source 04/01/20 0951 Oral     SpO2 04/01/20 0951 99 %     Weight 04/01/20 0954 188 lb 11.4 oz (85.6 kg)     Height 04/01/20 0954 _0  (1.854 m)     Head Circumference --      Peak Flow --      Pain Score --      Pain Loc --      Pain Edu? --      Excl. in [place]? --     Constitutional: Alert and oriented. Well appearing and in no acute distress.  Does not appear to in any distress.  Normal respiratory pattern.  Speaking in full clear sentences. Eyes: Conjunctivae are normal. Head: Atraumatic. Nose: No congestion/rhinnorhea. Mouth/Throat: Mucous membranes are moist. Neck: No stridor.  No JVD Cardiovascular: Normal rate, regular rhythm. Grossly normal heart sounds.  Good peripheral circulation. Respiratory: Normal respiratory effort.  No retractions. Lungs  CTAB. Gastrointestinal: Soft and nontender. No distention. Musculoskeletal: No lower extremity tenderness nor edema. Neurologic:  Normal speech and language. No gross focal neurologic deficits are appreciated.  Skin:  Skin is warm, dry and intact. No rash noted. Psychiatric: Mood and affect are normal. Speech and behavior are normal.  ____________________________________________   LABS (all labs ordered are listed, but only abnormal results are displayed)  Labs Reviewed  CBC WITH DIFFERENTIAL/PLATELET - Abnormal; Notable for the following components:      Result Value   WBC 23.7 (*)    Hemoglobin 12.9 (*)    Platelets 735 (*)    Neutro Abs 19.8 (*)    Monocytes Absolute 1.8 (*)    Abs Immature Granulocytes 0.41 (*)    All other components within normal limits  COMPREHENSIVE METABOLIC PANEL - Abnormal; Notable for the following components:   Chloride 97 (*)    Glucose, Bld 134 (*)    Albumin 3.2 (*)    AST 14 (*)    All other components within normal limits  CBC - Abnormal; Notable for the following components:   WBC 21.4 (*)    RBC 4.02 (*)    Hemoglobin 11.8 (*)    HCT 35.5 (*)    Platelets 738 (*)    All other components within normal limits  CULTURE, BLOOD (ROUTINE X 2)  CULTURE, BLOOD (ROUTINE X 2)  SARS CORONAVIRUS 2 (TAT 6-24 HRS)  MRSA PCR SCREENING  EXPECTORATED SPUTUM ASSESSMENT W GRAM STAIN, RFLX TO RESP C  LIPASE, BLOOD  BRAIN NATRIURETIC PEPTIDE  LACTIC ACID, PLASMA  PROCALCITONIN  STREP PNEUMONIAE URINARY ANTIGEN  LEGIONELLA PNEUMOPHILA SEROGP 1 UR AG  TROPONIN I (HIGH SENSITIVITY)    ____________________________________________  RADIOLOGY  CT Angio Chest PE W and/or Wo Contrast  Result Date: 04/01/2020 CLINICAL DATA:  Increased shortness of breath, recent diagnosis of pneumonia, generalized weakness, increasing difficulty walking short distances, on antibiotics and steroids, decreased appetite. Past history of colon cancer, laryngeal cancer, COPD,  GERD, former smoker EXAM: CT ANGIOGRAPHY CHEST WITH CONTRAST TECHNIQUE: Multidetector CT imaging of the chest was performed using the standard protocol during bolus administration of intravenous contrast. Multiplanar CT image reconstructions and MIPs were obtained to evaluate the vascular anatomy. CONTRAST:  27m OMNIPAQUE  IOHEXOL 350 MG/ML SOLN IV COMPARISON:  06/17/2019 FINDINGS: Cardiovascular: Atherosclerotic calcifications aorta, coronary arteries and proximal great vessels. Aorta normal caliber without aneurysm or dissection. Heart unremarkable. No pericardial effusion. Pulmonary arteries adequately opacified and patent. No evidence of pulmonary embolism. Mediastinum/Nodes: Esophagus unremarkable. Base of cervical region normal appearance. Enlarged AP window lymph node 15 mm image 38 previously 8 mm. Additional enlarged AP window lymph node 11 mm image 36. Multiple additional normal sized mediastinal and LEFT hilar nodes. Lungs/Pleura: Severe emphysematous changes. Minimal scarring in RIGHT upper lobe. Extensive consolidation in LEFT upper lobe and lingula LEFT lower lobe clear. Minimal LEFT pleural effusion. No pneumothorax. Consistent with pneumonia. Upper Abdomen: Questionable low-attenuation focus LEFT lobe liver 9 mm diameter image 85. Stomach decompressed with inadequate assessment of wall thickness. Remaining upper abdomen unremarkable. Musculoskeletal: No acute osseous findings. Review of the MIP images confirms the above findings. IMPRESSION: No evidence of pulmonary embolism. Emphysematous changes consistent with COPD. Extensive consolidation involving the LEFT upper lobe and lingula consistent with pneumonia. Mildly prominent AP window lymph nodes, potentially reactive in the setting of pneumonia. Questionable nonspecific 9 mm low-attenuation LEFT lobe liver. Recommend attention to AP window lymph nodes and questionable liver lesion on follow-up imaging. Aortic Atherosclerosis (ICD10-I70.0) and  Emphysema (ICD10-J43.9). Electronically Signed   By: Lavonia Dana M.D.   On: 04/01/2020 13:53   CT imaging reviewed, findings consistent with probable emphysema no pulmonary embolism, extensive consolidation in the left upper lobe.  Additional findings as noted above, ____________________________________________   PROCEDURES  Procedure(s) performed: None  Procedures  Critical Care performed: Yes, see critical care note(s)  CRITICAL CARE Performed by: Delman Kitten   Total critical care time: 25 minutes  Critical care time was exclusive of separately billable procedures and treating other patients.  Critical care was necessary to treat or prevent imminent or life-threatening deterioration.  Critical care was time spent personally by me on the following activities: development of treatment plan with patient and/or surrogate as well as nursing, discussions with consultants, evaluation of patient's response to treatment, examination of patient, obtaining history from patient or surrogate, ordering and performing treatments and interventions, ordering and review of laboratory studies, ordering and review of radiographic studies, pulse oximetry and re-evaluation of patient's condition.  Patient meets sepsis criteria with tachycardia, leukocytosis, pneumonia.  Admitted for broadening of antibiotic therapy including coverage for MRSA. ____________________________________________   INITIAL IMPRESSION / ASSESSMENT AND PLAN / ED COURSE  Pertinent labs & imaging results that were available during my care of the patient were reviewed by me and considered in my medical decision making (see chart for details).   Patient recently diagnosed with pneumonia and COPD at urgent care.  Currently on Levaquin prednisone and albuterol.  Despite these treatments continues to endorse a slowly worsening feeling of dyspnea.  At this point, given his increased dyspnea we will proceed with CT scan of the chest  discussed with the patient, wish to exclude PE, infiltrates, cardiomegaly, pericardial effusion, etc.  Denies any chest pain.  No has no signs or symptoms of ACS.  Will additionally evaluate with BNP.  No acute intra-abdominal symptoms.  Reassuring exam with normal vital signs at this time.  Await laboratory testing and additional imaging studies.  His lungs are quite clear, and he shows no respiratory distress no wheezing at this point, I do not see a need for additional albuterol or steroids at this point beyond what he is already prescribed.    Clinical Course as of 04/02/20 1224  Sun Apr 01, 2020  1309 Patient is resting comfortably at this time, understandable of delay for CT scan.  Labs reviewed notable for leukocytosis which I suspect may be a combination of infection, steroid use etc.  He is calm resting comfortably he and his family at the bedside [MQ]    Clinical Course User Index [MQ] Delman Kitten, MD     ____________________________________________   FINAL CLINICAL IMPRESSION(S) / ED DIAGNOSES  Final diagnoses:  Community acquired pneumonia of left upper lobe of lung  Sepsis, due to unspecified organism, unspecified whether acute organ dysfunction present [facility])  Liver lesion  Lymph node enlargement        Note:  This document was prepared using Dragon voice recognition software and may include unintentional dictation errors       Delman Kitten, MD 04/02/20 1224

## 2020-04-01 NOTE — ED Notes (Signed)
ED TO INPATIENT HANDOFF REPORT  ED Nurse Name and Phone #: Yong Channel RN   S Name/Age/Gender Kirk Rodriguez 75 y.o. male Room/Bed: ED03A/ED03A  Code Status   Code Status: Prior  Home/SNF/Other Home Patient oriented to: self, place, time and situation Is this baseline? Yes   Triage Complete: Triage complete  Chief Complaint CAP (community acquired pneumonia) [J18.9]  Triage Note Pt to ED via POV, c/o generalized weakness and SOB, states was dx with pneumonia on Monday. Pt states he is usually active and going, states has had increasing difficulty walking short distances. Pt states has been taking abx and steroids as prescribed, also c/o decreased appetite since starting abx and steroids.     Allergies No Known Allergies  Level of Care/Admitting Diagnosis ED Disposition    ED Disposition Condition [place]: [place] [[id number]]  Level of Care: Med-Surg [16]  Covid Evaluation: Symptomatic Person Under Investigation (PUI)  Diagnosis: CAP (community acquired pneumonia) [151761]  Admitting Physician: Ivor Costa [[id number]]  Attending Physician: Ivor Costa [phone]  Estimated length of stay: past midnight tomorrow  Certification:: I certify this patient will need inpatient services for at least 2 midnights       B Medical/Surgery History Past Medical History:  Diagnosis Date  . Allergy   . Cancer ([place])    Larynx--carcinoma in situ  . Colon cancer ([place])   . COPD (chronic obstructive pulmonary disease) ([facility])    no inhalers  . GERD (gastroesophageal reflux disease)    no meds  . Headache    h/o migraines  . Heart murmur    as a child  . Hyperlipidemia   . Wears dentures    full upper, partial lower   Past Surgical History:  Procedure Laterality Date  . COLONOSCOPY WITH PROPOFOL N/A 06/02/2019   Procedure: COLONOSCOPY WITH PROPOFOL;  Surgeon: Virgel Manifold, MD;  Location: [facility];  Service: Endoscopy;  Laterality:  N/A;  . CYSTOSCOPY N/A 06/29/2019   Procedure: CYSTOSCOPY;  Surgeon: Hollice Espy, MD;  Location: [facility];  Service: Urology;  Laterality: N/A;  . DIRECT LARYNGOSCOPY Right 04/03/2016   Procedure: DIRECT MICROLARYNGOSCOPY WITH EXCISON RIGHT VOCAL CORD LESION;  Surgeon: Margaretha Sheffield, MD;  Location: [facility];  Service: ENT;  Laterality: Right;  RIGHT   . INGUINAL HERNIA REPAIR Bilateral 12/18/2015   Procedure: LAPAROSCOPIC BILATERAL INGUINAL HERNIA REPAIR;  Surgeon: Hubbard Robinson, MD;  Location: [facility];  Service: General;  Laterality: Bilateral;  . MINOR EXCISION OF ORAL LESION Left 03/06/2016   Procedure: direct microlaryngoscopy with excision left vocal cord lesion;  Surgeon: Margaretha Sheffield, MD;  Location: [facility];  Service: ENT;  Laterality: Left;  left vocal cord lesion  . TRIGGER FINGER RELEASE Left 2016  . VASECTOMY Bilateral 1978  . XI ROBOTIC ASSISTED LOWER ANTERIOR RESECTION N/A 06/29/2019   Procedure: XI ROBOTIC ASSISTED LOWER ANTERIOR RESECTION;  Surgeon: Ronny Bacon, MD;  Location: [facility];  Service: General;  Laterality: N/A;     A IV Location/Drains/Wounds Patient Lines/Drains/Airways Status    Active Line/Drains/Airways    Name Placement date Placement time Site Days   Peripheral IV 04/01/20 Right Antecubital 04/01/20  1037  Antecubital  less than 1   Incision (Closed) 06/29/19 Abdomen Other (Comment);Left;Medial 06/29/19  1034  -- 277   Incision (Closed) 06/29/19 Perineum Other (Comment) 06/29/19  1034  -- 277   Incision - 4 Ports Abdomen Right;Lower Right;Upper Right;Medial Left;Upper 06/29/19  1035  --  277          Intake/Output Last 24 hours No intake or output data in the 24 hours ending 04/01/20 1609  Labs/Imaging Results for orders placed or performed during the hospital encounter of 04/01/20 (from the past 48 hour(s))  CBC with Differential     Status: Abnormal   Collection Time: 04/01/20 10:38 AM  Result Value Ref Range   WBC  23.7 (H) 4.0 - 10.5 K/uL   RBC 4.42 4.22 - 5.81 MIL/uL   Hemoglobin 12.9 (L) 13.0 - 17.0 g/dL   HCT 39.4 39.0 - 52.0 %   MCV 89.1 80.0 - 100.0 fL   MCH 29.2 26.0 - 34.0 pg   MCHC 32.7 30.0 - 36.0 g/dL   RDW 13.8 11.5 - 15.5 %   Platelets 735 (H) 150 - 400 K/uL   nRBC 0.0 0.0 - 0.2 %   Neutrophils Relative % 84 %   Neutro Abs 19.8 (H) 1.7 - 7.7 K/uL   Lymphocytes Relative 6 %   Lymphs Abs 1.5 0.7 - 4.0 K/uL   Monocytes Relative 8 %   Monocytes Absolute 1.8 (H) 0.1 - 1.0 K/uL   Eosinophils Relative 0 %   Eosinophils Absolute 0.1 0.0 - 0.5 K/uL   Basophils Relative 0 %   Basophils Absolute 0.1 0.0 - 0.1 K/uL   Immature Granulocytes 2 %   Abs Immature Granulocytes 0.41 (H) 0.00 - 0.07 K/uL    Comment: Performed at [facility], [address] [place], [place] [address]  Comprehensive metabolic panel     Status: Abnormal   Collection Time: 04/01/20 10:38 AM  Result Value Ref Range   Sodium 135 135 - 145 mmol/L   Potassium 4.0 3.5 - 5.1 mmol/L   Chloride 97 (L) 98 - 111 mmol/L   CO2 26 22 - 32 mmol/L   Glucose, Bld 134 (H) 70 - 99 mg/dL    Comment: Glucose reference range applies only to samples taken after fasting for at least 8 hours.   BUN 18 8 - 23 mg/dL   Creatinine, Ser 1.06 0.61 - 1.24 mg/dL   Calcium 9.0 8.9 - 10.3 mg/dL   Total Protein 7.5 6.5 - 8.1 g/dL   Albumin 3.2 (L) 3.5 - 5.0 g/dL   AST 14 (L) 15 - 41 U/L   ALT 25 0 - 44 U/L   Alkaline Phosphatase 65 38 - 126 U/L   Total Bilirubin 0.5 0.3 - 1.2 mg/dL   GFR, Estimated >60 >60 mL/min    Comment: (NOTE) Calculated using the CKD-EPI Creatinine Equation (2021)    Anion gap 12 5 - 15    Comment: Performed at [facility], [address] [place], [place] [address]  Lipase, blood     Status: None   Collection Time: 04/01/20 10:38 AM  Result Value Ref Range   Lipase 26 11 - 51 U/L    Comment: Performed at [facility], [address] [place], [place] [address]  Brain natriuretic  peptide     Status: None   Collection Time: 04/01/20 10:38 AM  Result Value Ref Range   B Natriuretic Peptide 68.6 0.0 - 100.0 pg/mL    Comment: Performed at [facility], [place], [place] [address]  Troponin I (High Sensitivity)     Status: None   Collection Time: 04/01/20 10:38 AM  Result Value Ref Range   Troponin I (High Sensitivity) 7 <18 ng/L    Comment: (NOTE) Elevated  high sensitivity troponin I (hsTnI) values and significant  changes across serial measurements may suggest ACS but many other  chronic and acute conditions are known to elevate hsTnI results.  Refer to the "Links" section for chest pain algorithms and additional  guidance. Performed at [facility], [address] [place], [place] [address]   Procalcitonin     Status: None   Collection Time: 04/01/20 10:38 AM  Result Value Ref Range   Procalcitonin 0.13 ng/mL    Comment:        Interpretation: PCT (Procalcitonin) <= 0.5 ng/mL: Systemic infection (sepsis) is not likely. Local bacterial infection is possible. (NOTE)       Sepsis PCT Algorithm           Lower Respiratory Tract                                      Infection PCT Algorithm    ----------------------------     ----------------------------         PCT < 0.25 ng/mL                PCT < 0.10 ng/mL          Strongly encourage             Strongly discourage   discontinuation of antibiotics    initiation of antibiotics    ----------------------------     -----------------------------       PCT 0.25 - 0.50 ng/mL            PCT 0.10 - 0.25 ng/mL               OR       >80% decrease in PCT            Discourage initiation of                                            antibiotics      Encourage discontinuation           of antibiotics    ----------------------------     -----------------------------         PCT >= 0.50 ng/mL              PCT 0.26 - 0.50 ng/mL               AND        <80% decrease in PCT              Encourage initiation of                                             antibiotics       Encourage continuation           of antibiotics    ----------------------------     -----------------------------        PCT >= 0.50 ng/mL                  PCT > 0.50 ng/mL               AND         increase in PCT  Strongly encourage                                      initiation of antibiotics    Strongly encourage escalation           of antibiotics                                     -----------------------------                                           PCT <= 0.25 ng/mL                                                 OR                                        > 80% decrease in PCT                                      Discontinue / Do not initiate                                             antibiotics  Performed at [facility], [address] [place], [place] [address]   Lactic acid, plasma     Status: None   Collection Time: 04/01/20 11:21 AM  Result Value Ref Range   Lactic Acid, Venous 0.9 0.5 - 1.9 mmol/L    Comment: Performed at [facility], [place], [place] [address]   CT Angio Chest PE W and/or Wo Contrast  Result Date: 04/01/2020 CLINICAL DATA:  Increased shortness of breath, recent diagnosis of pneumonia, generalized weakness, increasing difficulty walking short distances, on antibiotics and steroids, decreased appetite. Past history of colon cancer, laryngeal cancer, COPD, GERD, former smoker EXAM: CT ANGIOGRAPHY CHEST WITH CONTRAST TECHNIQUE: Multidetector CT imaging of the chest was performed using the standard protocol during bolus administration of intravenous contrast. Multiplanar CT image reconstructions and MIPs were obtained to evaluate the vascular anatomy. CONTRAST:  21mL OMNIPAQUE IOHEXOL 350 MG/ML SOLN IV COMPARISON:  06/17/2019 FINDINGS: Cardiovascular: Atherosclerotic calcifications aorta, coronary arteries and  proximal great vessels. Aorta normal caliber without aneurysm or dissection. Heart unremarkable. No pericardial effusion. Pulmonary arteries adequately opacified and patent. No evidence of pulmonary embolism. Mediastinum/Nodes: Esophagus unremarkable. Base of cervical region normal appearance. Enlarged AP window lymph node 15 mm image 38 previously 8 mm. Additional enlarged AP window lymph node 11 mm image 36. Multiple additional normal sized mediastinal and LEFT hilar nodes. Lungs/Pleura: Severe emphysematous changes. Minimal scarring in RIGHT upper lobe. Extensive consolidation in LEFT upper lobe and lingula LEFT lower lobe clear. Minimal LEFT pleural effusion. No pneumothorax. Consistent with pneumonia. Upper Abdomen: Questionable low-attenuation focus LEFT lobe liver 9 mm  diameter image 85. Stomach decompressed with inadequate assessment of wall thickness. Remaining upper abdomen unremarkable. Musculoskeletal: No acute osseous findings. Review of the MIP images confirms the above findings. IMPRESSION: No evidence of pulmonary embolism. Emphysematous changes consistent with COPD. Extensive consolidation involving the LEFT upper lobe and lingula consistent with pneumonia. Mildly prominent AP window lymph nodes, potentially reactive in the setting of pneumonia. Questionable nonspecific 9 mm low-attenuation LEFT lobe liver. Recommend attention to AP window lymph nodes and questionable liver lesion on follow-up imaging. Aortic Atherosclerosis (ICD10-I70.0) and Emphysema (ICD10-J43.9). Electronically Signed   By: Lavonia Dana M.D.   On: 04/01/2020 13:53    Pending Labs Unresulted Labs (From admission, onward)          Start     Ordered   04/01/20 1517  SARS CORONAVIRUS 2 (TAT 6-24 HRS) Nasopharyngeal Nasopharyngeal Swab  (Tier 3 - Symptomatic/asymptomatic with Precautions)  Once,   STAT       Question Answer Comment  Is this test for diagnosis or screening Screening   Symptomatic for COVID-19 as defined by  CDC No   Hospitalized for COVID-19 No   Admitted to ICU for COVID-19 No   Previously tested for COVID-19 Yes   Resident in a congregate (group) care setting No   Employed in healthcare setting No   Has patient completed COVID vaccination(s) (2 doses of Pfizer/Moderna 1 dose of [employer]) Yes   Has patient completed COVID Booster / 3rd dose Yes      04/01/20 1516   04/01/20 1501  MRSA PCR Screening  ONCE - STAT,   STAT        04/01/20 1501   04/01/20 1105  Culture, blood (Routine X 2) w Reflex to ID Panel  BLOOD CULTURE X 2,   STAT      04/01/20 1104   Signed and Held  Culture, sputum-assessment  Once,   R        Signed and Held   Signed and Held  Strep pneumoniae urinary antigen  Once,   R        Signed and Held   Signed and Held  Legionella Pneumophila Serogp 1 Ur Ag  Once,   R        Signed and Held   Visual merchandiser and Held  Basic metabolic panel  Tomorrow morning,   R        Signed and Held   Signed and Held  CBC  Tomorrow morning,   R        Signed and Held          Vitals/Pain Today's Vitals   04/01/20 0951 04/01/20 0954 04/01/20 1100 04/01/20 1351  BP: 133/90  118/81 113/76  Pulse: 99  80 88  Resp: 16  17 (!) 24  Temp: 98 F (36.7 C)     TempSrc: Oral     SpO2: 99%  97% 99%  Weight:  85.6 kg    Height:  6\' 1"  (1.854 m)      Isolation Precautions Airborne and Contact precautions  Medications Medications  vancomycin (VANCOREADY) IVPB 2000 mg/400 mL (2,000 mg Intravenous New Bag/Given 04/01/20 1506)  albuterol (VENTOLIN HFA) 108 (90 Base) MCG/ACT inhaler 2 puff (has no administration in time range)  ipratropium (ATROVENT HFA) inhaler 2 puff (2 puffs Inhalation Given 04/01/20 1507)  dextromethorphan-guaiFENesin (MUCINEX DM) 30-600 MG per 12 hr tablet 1 tablet (has no administration in time range)  ondansetron (ZOFRAN) injection 4 mg (has no administration in  time range)  hydrALAZINE (APRESOLINE) injection 5 mg (has no administration in time range)  acetaminophen  (TYLENOL) tablet 650 mg (has no administration in time range)  ceFEPIme (MAXIPIME) 2 g in sodium chloride 0.9 % 100 mL IVPB (has no administration in time range)  vancomycin (VANCOCIN) IVPB 1000 mg/200 mL premix (has no administration in time range)  iohexol (OMNIPAQUE) 350 MG/ML injection 75 mL (75 mLs Intravenous Contrast Given 04/01/20 1326)    Mobility walks Low fall risk   Focused Assessments Pulmonary Assessment Handoff:  Lung sounds:   O2 Device: Room Air        R Recommendations: See Admitting Provider Note  Report given to:   Additional Notes: n/a

## 2020-04-01 NOTE — Consult Note (Signed)
Pharmacy Antibiotic Note  Kirk Rodriguez is a 75 y.o. male admitted on 04/01/2020 with pneumonia. Pt presented with weakness and shortness of breath. Pt states they were diagnosed with pneumonia on Monday. Pharmacy has been consulted for vancomycin and cefepime dosing.  Plan: -- cefepime 2g IV q8h -- vancomycin 2g IV x 1 loading dose, followed by vancomycin 1000 mg q12h ---- Estimated AUC 525.5, Cmin 15.7 ---- Monitor levels at steady state if vancomycin continues -- Monitor renal function   Height: 6\' 1"  (185.4 cm) Weight: 85.6 kg (188 lb 11.4 oz) IBW/kg (Calculated) : 79.9  Temp (24hrs), Avg:98 F (36.7 C), Min:98 F (36.7 C), Max:98 F (36.7 C)  Recent Labs  Lab 04/01/20 1038 04/01/20 1121  WBC 23.7*  --   CREATININE 1.06  --   LATICACIDVEN  --  0.9    Estimated Creatinine Clearance: 69.1 mL/min (by C-G formula based on SCr of 1.06 mg/dL).    No Known Allergies  Antimicrobials this admission: 3/27 vanc >> 3/27 cefepime >>  Dose adjustments this admission: N/A  Microbiology results: 3/27 BCx: pending 3/27 MRSA PCR: pending  Thank you for allowing pharmacy to be a part of this patient's care.  Benn Moulder, PharmD Pharmacy Resident  04/01/2020 3:11 PM

## 2020-04-02 DIAGNOSIS — D72828 Other elevated white blood cell count: Secondary | ICD-10-CM

## 2020-04-02 DIAGNOSIS — D75839 Thrombocytosis, unspecified: Secondary | ICD-10-CM

## 2020-04-02 DIAGNOSIS — J189 Pneumonia, unspecified organism: Secondary | ICD-10-CM

## 2020-04-02 LAB — CBC
HCT: 35.5 % — ABNORMAL LOW (ref 39.0–52.0)
Hemoglobin: 11.8 g/dL — ABNORMAL LOW (ref 13.0–17.0)
MCH: 29.4 pg (ref 26.0–34.0)
MCHC: 33.2 g/dL (ref 30.0–36.0)
MCV: 88.3 fL (ref 80.0–100.0)
Platelets: 738 10*3/uL — ABNORMAL HIGH (ref 150–400)
RBC: 4.02 MIL/uL — ABNORMAL LOW (ref 4.22–5.81)
RDW: 14 % (ref 11.5–15.5)
WBC: 21.4 10*3/uL — ABNORMAL HIGH (ref 4.0–10.5)
nRBC: 0 % (ref 0.0–0.2)

## 2020-04-02 LAB — SARS CORONAVIRUS 2 (TAT 6-24 HRS): SARS Coronavirus 2: NEGATIVE

## 2020-04-02 NOTE — Evaluation (Signed)
Occupational Therapy Evaluation Patient Details Name: Kirk Rodriguez MRN: [id number] DOB: 1945/07/18 Today's Date: 04/02/2020    History of Present Illness 75 y.o. 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 Patient states that he has been having shortness breath, cough for almost 2 weeks.  Patient was seen in urgent care on 3/21, and diagnosed with pneumonia. He was started on Levaquin and steroid.  Patient states that he is taking these medications, but no improvement.  His symptoms has been progressively worsening. Patient does not have chest pain, fever or chills.  He states that he had diarrhea recently, which has resolved.  Currently no nausea, vomiting, diarrhea or abdominal pain.  No symptoms of UTI. He has generalized weakness, poor appetite and decreased oral intake. CV-19 negative.   Clinical Impression   Pt was seen for OT evaluation this date. Prior to hospital admission, pt was active and independent. Pt lives with his spouse and several pets (inside and out). No falls, no difficulty, and able to walk daily. Pt independent with ADL mobility and ADL at time of evaluation. No SOB noted with negotiating nursing unit x2 laps on room air and conversing throughout (HR up to 108, SpO2 94% on room air, back up to 99% once returned to bed). No functional deficits noted aside from mild impaired activity tolerance 2/2 recent illness. Pt instructed in progressive return to activity. Pt verbalized understanding. No additional skilled OT needs at this time. Will sign off.      Follow Up Recommendations  No OT follow up    Equipment Recommendations  None recommended by OT    Recommendations for Other Services       Precautions / Restrictions Precautions Precautions:  None Restrictions Weight Bearing Restrictions: No      Mobility Bed Mobility Overal bed mobility: Independent                  Transfers Overall transfer level: Independent Equipment used: None                  Balance Overall balance assessment: Independent                                         ADL either performed or assessed with clinical judgement   ADL Overall ADL's : Independent;At baseline                                             Vision Patient Visual Report: No change from baseline       Perception     Praxis      Pertinent Vitals/Pain Pain Assessment: No/denies pain     Hand Dominance     Extremity/Trunk Assessment Upper Extremity Assessment Upper Extremity Assessment: Overall WFL for tasks assessed   Lower Extremity Assessment Lower Extremity Assessment: Overall WFL for tasks assessed   Cervical / Trunk Assessment Cervical / Trunk Assessment: Normal   Communication Communication Communication: No difficulties   Cognition Arousal/Alertness: Awake/alert Behavior During Therapy: WFL for tasks assessed/performed Overall Cognitive Status: Within Functional Limits for tasks assessed  General Comments       Exercises Other Exercises Other Exercises: Pt educated in energy conservation and progress return to PLOF and activities Other Exercises: Amb around nursing unit x2 while monitoring vitals. HR up to 108, SpO2 down to 94% on room air. Pt able to ambulate independently and converse without SOB or difficulty.   Shoulder Instructions      Home Living Family/patient expects to be discharged to:: Private residence Living Arrangements: Spouse/significant other Available Help at Discharge: Family;Available 24 hours/day Type of Home: House                       Home Equipment: None          Prior Functioning/Environment Level of  Independence: Independent        Comments: Pt retired, independent with ADL, IADL, caring for pets, and no falls. Stays active and walks daily        OT Problem List: Decreased activity tolerance      OT Treatment/Interventions:      OT Goals(Current goals can be found in the care plan section) Acute Rehab OT Goals Patient Stated Goal: get some sleep OT Goal Formulation: All assessment and education complete, DC therapy  OT Frequency:     Barriers to D/C:            Co-evaluation              AM-PAC OT "6 Clicks" Daily Activity     Outcome Measure Help from another person eating meals?: None Help from another person taking care of personal grooming?: None Help from another person toileting, which includes using toliet, bedpan, or urinal?: None Help from another person bathing (including washing, rinsing, drying)?: None Help from another person to put on and taking off regular upper body clothing?: None Help from another person to put on and taking off regular lower body clothing?: None 6 Click Score: 24   End of Session Nurse Communication: Mobility status  Activity Tolerance: Patient tolerated treatment well Patient left: in bed;with call bell/phone within reach  OT Visit Diagnosis: Other abnormalities of gait and mobility (R26.89)                Time: 6606-0045 OT Time Calculation (min): 26 min Charges:  OT General Charges $OT Visit: 1 Visit OT Evaluation $OT Eval Low Complexity: 1 Low OT Treatments $Therapeutic Activity: 8-22 mins  Hanley Hays, MPH, MS, OTR/L ascom [phone] 04/02/20, 1:52 PM

## 2020-04-02 NOTE — Evaluation (Addendum)
Physical Therapy Evaluation Patient Details Name: Kirk Rodriguez MRN: [id number] DOB: Nov 16, 1945 Today's Date: 04/02/2020   History of Present Illness  Patient is a 75 y.o. 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 Found to have sepsis due to community acquired pneumonia  Clinical Impression  Patient agreeable to PT evaluation. Patient is independent with all activity. He ambulated around the hallway and in the room and maintained conversation without notable shortness of breath. Sp02 94% with activity on room air and 98% at rest. Patient was able to briefly stand on one leg, navigate obstacles, and reach outside base of support without loss of balance. Education provided on activity at home and importance of routine mobility for conditioning. No further acute PT needs at this time as patient is likely at baseline level of functional mobility.     Follow Up Recommendations No PT follow up    Equipment Recommendations  None recommended by PT    Recommendations for Other Services       Precautions / Restrictions Precautions Precautions: None Restrictions Weight Bearing Restrictions: No      Mobility  Bed Mobility Overal bed mobility: Independent                  Transfers Overall transfer level: Independent Equipment used: None                Ambulation/Gait Ambulation/Gait assistance: Independent Gait Distance (Feet): 200 Feet Assistive device: IV Pole (intermittent use of IV pole) Gait Pattern/deviations: WFL(Within Functional Limits) Gait velocity: normal walking speed   General Gait Details: Patient ambulated in the room and around the unit without loss of balance. Sp02 94% on room air with walking and 98% at rest.  Patient able to carry on a converstaion with activity without notable shortness of breath  Stairs            Wheelchair Mobility    Modified Rankin (Stroke Patients Only)       Balance Overall balance assessment: Independent                                           Pertinent Vitals/Pain Pain Assessment: No/denies pain    Home Living Family/patient expects to be discharged to:: Private residence Living Arrangements: Spouse/significant other Available Help at Discharge: Family;Available 24 hours/day Type of Home: House       Home Layout: Two level;Bed/bath upstairs Home Equipment: None      Prior Function Level of Independence: Independent         Comments: Patient is active and independent with ambulation and ADLs. Patient walks with his dog for exercise     Hand Dominance        Extremity/Trunk Assessment   Upper Extremity Assessment Upper Extremity Assessment: Overall WFL for tasks assessed    Lower Extremity Assessment Lower Extremity Assessment: Overall WFL for tasks assessed    Cervical / Trunk Assessment Cervical / Trunk Assessment: Normal  Communication   Communication: No difficulties  Cognition Arousal/Alertness: Awake/alert Behavior During Therapy: WFL for tasks assessed/performed Overall Cognitive Status: Within Functional Limits for tasks assessed  General Comments      Exercises Other Exercises Other Exercises: educated patient on progressing activity at home and importance of mobility Other Exercises: Amb around nursing unit x2 while monitoring vitals. HR up to 108, SpO2 down to 94% on room air. Pt able to ambulate independently and converse without SOB or difficulty.   Assessment/Plan    PT Assessment Patent does not need any further PT services  PT Problem List         PT Treatment Interventions      PT Goals (Current goals can be found in the  Care Plan section)  Acute Rehab PT Goals Patient Stated Goal: to sleep for one night and then go home PT Goal Formulation: All assessment and education complete, DC therapy Time For Goal Achievement: 04/02/20 Potential to Achieve Goals: Good    Frequency     Barriers to discharge        Co-evaluation               AM-PAC PT "6 Clicks" Mobility  Outcome Measure Help needed turning from your back to your side while in a flat bed without using bedrails?: None Help needed moving from lying on your back to sitting on the side of a flat bed without using bedrails?: None Help needed moving to and from a bed to a chair (including a wheelchair)?: None Help needed standing up from a chair using your arms (e.g., wheelchair or bedside chair)?: None Help needed to walk in hospital room?: None Help needed climbing 3-5 steps with a railing? : None 6 Click Score: 24    End of Session   Activity Tolerance: Patient tolerated treatment well Patient left: in bed;with call bell/phone within reach Nurse Communication: Mobility status PT Visit Diagnosis: Muscle weakness (generalized) (M62.81)    Time: 9741-6384 PT Time Calculation (min) (ACUTE ONLY): 34 min   Charges:   PT Evaluation $PT Eval Low Complexity: 1 Low PT Treatments $Therapeutic Activity: 8-22 mins        Minna Merritts, PT, MPT  Percell Locus 04/02/2020, 2:45 PM

## 2020-04-02 NOTE — Progress Notes (Signed)
PROGRESS NOTE    Kirk Rodriguez  MOQ:[id number] DOB: 07/30/1945 DOA: 04/01/2020 PCP: Barbaraann Boys, MD   Assessment & Plan:   Principal Problem:   CAP (community acquired pneumonia) Active Problems:   COPD (chronic obstructive pulmonary disease) ([place])   Hyperlipidemia, unspecified   Hypertension   Adenocarcinoma of sigmoid colon ([place])   Sepsis ([place])   Liver lesion   Sepsis: secondary to CAP. Meets criteria w/ leukocytosis, tachycardia, tachypnea. Pt failed outpatient treatment w/ levaquin. Continue on IV vanco & cefepime. Legionella, strep are ordered. Continue on bronchodilators   CAP: continue on IV vanco, cefepime. Procal 0.13. Continue on bronchodilators. Legionella, strep are ordered  Leukocytosis: likely secondary to infection. Continue on IV abxs  Thrombocytosis: etiology unclear, likely reactive. Will continue to monitor   COPD: w/o exacerbation. Continue on bronchodilators and encourage incentive spirometry   HLD: continue on statin   Liver lesion & hx of adenocarcinoma of sigmoid colon: CTA incidentally showed questionable nonspecific 9 mm low-attenuation LEFT lobe liver. Needs to f/u w/ oncology outpatient for further evaluation    DVT prophylaxis: lovenox  Code Status: full  Family Communication:  Disposition Plan: depends on PT/OT recs  Level of care: Med-Surg   Status is: Inpatient  Remains inpatient appropriate because:Unsafe d/c plan, IV treatments appropriate due to intensity of illness or inability to take PO and Inpatient level of care appropriate due to severity of illness   Dispo: The patient is from: Home              Anticipated d/c is to: Home              Patient currently is not medically stable to d/c.   Difficult to place patient Yes        Consultants:      Procedures:    Antimicrobials: cefepime, vanco   Subjective: Pt c/o shortness of breath   Objective: Vitals:   04/01/20 2158 04/02/20 0145 04/02/20 0502  04/02/20 0707  BP: 116/80 116/71 120/77   Pulse: 99 95 88   Resp: 16 20 16    Temp: 98.1 F (36.7 C) 98.4 F (36.9 C) 99.1 F (37.3 C)   TempSrc: Oral Oral Oral   SpO2: 96% 92% 94% 96%  Weight:      Height:        Intake/Output Summary (Last 24 hours) at 04/02/2020 0724 Last data filed at 04/01/2020 2358 Gross per 24 hour  Intake 220 ml  Output --  Net 220 ml   Filed Weights   04/01/20 0954  Weight: 85.6 kg    Examination:  General exam: Appears calm and comfortable  Respiratory system: diminished breath sounds b/l. Cardiovascular system: S1 & S2 +. No rubs or clicks.  Gastrointestinal system: Abdomen is nondistended, soft and nontender. Normal bowel sounds heard. Central nervous system: Alert and oriented. Moves all extremities  Psychiatry: Judgement and insight appear normal. Mood & affect appropriate.     Data Reviewed: I have personally reviewed following labs and imaging studies  CBC: Recent Labs  Lab 04/01/20 1038 04/02/20 0348  WBC 23.7* 21.4*  NEUTROABS 19.8*  --   HGB 12.9* 11.8*  HCT 39.4 35.5*  MCV 89.1 88.3  PLT 735* 354*   Basic Metabolic Panel: Recent Labs  Lab 04/01/20 1038  NA 135  K 4.0  CL 97*  CO2 26  GLUCOSE 134*  BUN 18  CREATININE 1.06  CALCIUM 9.0   GFR: Estimated Creatinine Clearance: 69.1 mL/min (by C-G formula based  on SCr of 1.06 mg/dL). Liver Function Tests: Recent Labs  Lab 04/01/20 1038  AST 14*  ALT 25  ALKPHOS 65  BILITOT 0.5  PROT 7.5  ALBUMIN 3.2*   Recent Labs  Lab 04/01/20 1038  LIPASE 26   No results for input(s): AMMONIA in the last 168 hours. Coagulation Profile: No results for input(s): INR, PROTIME in the last 168 hours. Cardiac Enzymes: No results for input(s): CKTOTAL, CKMB, CKMBINDEX, TROPONINI in the last 168 hours. BNP (last 3 results) No results for input(s): PROBNP in the last 8760 hours. HbA1C: No results for input(s): HGBA1C in the last 72 hours. CBG: No results for input(s):  GLUCAP in the last 168 hours. Lipid Profile: No results for input(s): CHOL, HDL, LDLCALC, TRIG, CHOLHDL, LDLDIRECT in the last 72 hours. Thyroid Function Tests: No results for input(s): TSH, T4TOTAL, FREET4, T3FREE, THYROIDAB in the last 72 hours. Anemia Panel: No results for input(s): VITAMINB12, FOLATE, FERRITIN, TIBC, IRON, RETICCTPCT in the last 72 hours. Sepsis Labs: Recent Labs  Lab 04/01/20 1038 04/01/20 1121  PROCALCITON 0.13  --   LATICACIDVEN  --  0.9    No results found for this or any previous visit (from the past 240 hour(s)).       Radiology Studies: CT Angio Chest PE W and/or Wo Contrast  Result Date: 04/01/2020 CLINICAL DATA:  Increased shortness of breath, recent diagnosis of pneumonia, generalized weakness, increasing difficulty walking short distances, on antibiotics and steroids, decreased appetite. Past history of colon cancer, laryngeal cancer, COPD, GERD, former smoker EXAM: CT ANGIOGRAPHY CHEST WITH CONTRAST TECHNIQUE: Multidetector CT imaging of the chest was performed using the standard protocol during bolus administration of intravenous contrast. Multiplanar CT image reconstructions and MIPs were obtained to evaluate the vascular anatomy. CONTRAST:  71mL OMNIPAQUE IOHEXOL 350 MG/ML SOLN IV COMPARISON:  06/17/2019 FINDINGS: Cardiovascular: Atherosclerotic calcifications aorta, coronary arteries and proximal great vessels. Aorta normal caliber without aneurysm or dissection. Heart unremarkable. No pericardial effusion. Pulmonary arteries adequately opacified and patent. No evidence of pulmonary embolism. Mediastinum/Nodes: Esophagus unremarkable. Base of cervical region normal appearance. Enlarged AP window lymph node 15 mm image 38 previously 8 mm. Additional enlarged AP window lymph node 11 mm image 36. Multiple additional normal sized mediastinal and LEFT hilar nodes. Lungs/Pleura: Severe emphysematous changes. Minimal scarring in RIGHT upper lobe. Extensive  consolidation in LEFT upper lobe and lingula LEFT lower lobe clear. Minimal LEFT pleural effusion. No pneumothorax. Consistent with pneumonia. Upper Abdomen: Questionable low-attenuation focus LEFT lobe liver 9 mm diameter image 85. Stomach decompressed with inadequate assessment of wall thickness. Remaining upper abdomen unremarkable. Musculoskeletal: No acute osseous findings. Review of the MIP images confirms the above findings. IMPRESSION: No evidence of pulmonary embolism. Emphysematous changes consistent with COPD. Extensive consolidation involving the LEFT upper lobe and lingula consistent with pneumonia. Mildly prominent AP window lymph nodes, potentially reactive in the setting of pneumonia. Questionable nonspecific 9 mm low-attenuation LEFT lobe liver. Recommend attention to AP window lymph nodes and questionable liver lesion on follow-up imaging. Aortic Atherosclerosis (ICD10-I70.0) and Emphysema (ICD10-J43.9). Electronically Signed   By: Lavonia Dana M.D.   On: 04/01/2020 13:53        Scheduled Meds: . atorvastatin  40 mg Oral Daily  . cholecalciferol  2,000 Units Oral Daily  . enoxaparin (LOVENOX) injection  40 mg Subcutaneous Q24H  . ipratropium  2 puff Inhalation Q6H  . multivitamin with minerals  1 tablet Oral Daily  . multivitamin-lutein  1 capsule Oral QHS  .  triamcinolone  1 spray Nasal Daily   Continuous Infusions: . ceFEPime (MAXIPIME) IV 2 g (04/02/20 0519)  . vancomycin 1,000 mg (04/02/20 0357)     LOS: 1 day    Time spent: 33 mins    Wyvonnia Dusky, MD [facility] Pager [phone]  If 7PM-7AM, please contact night-coverage 04/02/2020, 7:24 AM

## 2020-04-02 NOTE — Plan of Care (Signed)

## 2020-04-03 DIAGNOSIS — K769 Liver disease, unspecified: Secondary | ICD-10-CM

## 2020-04-03 LAB — BASIC METABOLIC PANEL
Anion gap: 9 (ref 5–15)
BUN: 22 mg/dL (ref 8–23)
CO2: 23 mmol/L (ref 22–32)
Calcium: 9 mg/dL (ref 8.9–10.3)
Chloride: 102 mmol/L (ref 98–111)
Creatinine, Ser: 1.18 mg/dL (ref 0.61–1.24)
GFR, Estimated: >60 mL/min (ref >60)
Glucose, Bld: 121 mg/dL — ABNORMAL HIGH (ref 70–99)
Potassium: 4.1 mmol/L (ref 3.5–5.1)
Sodium: 134 mmol/L — ABNORMAL LOW (ref 135–145)

## 2020-04-03 LAB — CBC
HCT: 36.6 % — ABNORMAL LOW (ref 39.0–52.0)
Hemoglobin: 12.2 g/dL — ABNORMAL LOW (ref 13.0–17.0)
MCH: 29.1 pg (ref 26.0–34.0)
MCHC: 33.3 g/dL (ref 30.0–36.0)
MCV: 87.4 fL (ref 80.0–100.0)
Platelets: 747 10*3/uL — ABNORMAL HIGH (ref 150–400)
RBC: 4.19 MIL/uL — ABNORMAL LOW (ref 4.22–5.81)
RDW: 14 % (ref 11.5–15.5)
WBC: 21.1 10*3/uL — ABNORMAL HIGH (ref 4.0–10.5)
nRBC: 0 % (ref 0.0–0.2)

## 2020-04-03 LAB — MRSA PCR SCREENING

## 2020-04-03 MED ORDER — CEFPODOXIME PROXETIL 200 MG PO TABS: 200.0000 mg | ORAL_TABLET | Freq: Two times a day (BID) | ORAL | 0 refills | Status: AC

## 2020-04-03 MED ORDER — DOXYCYCLINE MONOHYDRATE 100 MG PO TABS: 100.0000 mg | ORAL_TABLET | Freq: Two times a day (BID) | ORAL | 0 refills | Status: AC

## 2020-04-03 NOTE — Discharge Summary (Signed)
Physician Discharge Summary  ELIJIO STAPLES BDH:[id number] DOB: Sep 01, 1945 DOA: 04/01/2020  PCP: Orene Desanctis, MD  Admit date: 04/01/2020 Discharge date: 04/03/2020  Admitted From: home Disposition:  home  Recommendations for Outpatient Follow-up:  1. Follow up with PCP in 1-2 weeks & get CBC to check WBC  2. F/u w/ oncology w/in 1 month    Home Health: no Equipment/Devices:  Discharge Condition: stable  CODE STATUS: full  Diet recommendation: Heart Healthy  Brief/Interim Summary: HPI was taken from Dr. Clyde Lundborg: EDAHI KROENING is a 75 y.o. 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  Patient states that he has been having shortness breath, cough for almost 2 weeks.  Patient was seen in urgent care on 3/21, and diagnosed with pneumonia. He was started on Levaquin and steroid.  Patient states that he is taking these medications, but no improvement.  His symptoms has been progressively worsening. Patient does not have chest pain, fever or chills.  He states that he had diarrhea recently, which has resolved.  Currently no nausea, vomiting, diarrhea or abdominal pain.  No symptoms of UTI. He has generalized weakness, poor appetite and decreased oral intake.  ED Course: pt was found to have WBC 23.7, lactic acid of 0.9, troponin level 7, BNP 68, lipase 26, electrolytes renal function okay, temperature normal, blood pressure 113/76, heart rate 99, 88, RR 24, oxygen saturation 97% on room air.  CT angiogram negative for PE, but showed left upper lobe and lingula infiltration.  Also showed possible 9 mm lesion in the left lobe of liver.  Patient is admitted to MedSurg bed as inpatient.   Hospital course from Dr. Mayford Knife 3/28-3/29/22: Pt presented w/ sepsis  secondary to CAP. Pt was treated w/ IV vanco & cefepime, bronchodilators and incentive spirometry. Pt did not require supplemental oxygen while inpatient at all. Of note, a nonspecific liver lesion was found on CTA incidentally and pt will f/u outpatient w/ his oncologist. Pt verbalized his understanding. PT/OT evaluated the pt and no further therapy was recommended   Discharge Diagnoses:  Principal Problem:   CAP (community acquired pneumonia) Active Problems:   COPD (chronic obstructive pulmonary disease) (HCC)   Hyperlipidemia, unspecified   Hypertension   Adenocarcinoma of sigmoid colon (HCC)   Sepsis (HCC)   Liver lesion  Sepsis: secondary to CAP. Meets criteria w/ leukocytosis, tachycardia, tachypnea. Pt failed outpatient treatment w/ levaquin. Continue on IV vanco & cefepime while inpatient. Legionella, strep are ordered. Continue on bronchodilators   CAP: continue on IV vanco, cefepime. Procal 0.13. Continue on bronchodilators. Legionella, strep are ordered  Leukocytosis: likely secondary to infection. Continue on IV abxs  Thrombocytosis: etiology unclear, likely reactive. Will continue to monitor   COPD: w/o exacerbation. Continue on bronchodilators and encourage incentive spirometry   HLD: continue on statin   Liver lesion& hx of adenocarcinoma of sigmoid colon: CTAincidentally showed questionable nonspecific 9 mm low-attenuation LEFT lobe liver. Needs to f/u w/ oncology outpatient for further evaluation   Discharge Instructions  Discharge Instructions    Diet - low sodium heart healthy   Complete by: As directed    Discharge instructions   Complete by: As directed    F/u w/ PCP in 1-2 weeks. CTAincidentally showed questionable nonspecific 9 mm low-attenuation LEFT lobe liver.Needs to f/u w/ oncology outpatient  for further evaluation   Increase activity slowly   Complete by: As directed      Allergies as of 04/03/2020   No Known Allergies     Medication  List    STOP taking these medications   levofloxacin 500 MG tablet Commonly known as: LEVAQUIN   predniSONE 10 MG tablet Commonly known as: DELTASONE     TAKE these medications   acetaminophen 500 MG tablet Commonly known as: TYLENOL Take 500 mg by mouth every 6 (six) hours as needed (pain.).   albuterol 108 (90 Base) MCG/ACT inhaler Commonly known as: VENTOLIN HFA Inhale 2 puffs into the lungs every 6 (six) hours as needed for wheezing or shortness of breath.   Aleve PM 220-25 MG Tabs Generic drug: Naproxen Sod-diphenhydrAMINE Take 1 tablet by mouth daily.   benzonatate 200 MG capsule Commonly known as: TESSALON Take 200 mg by mouth 3 (three) times daily as needed for cough.   beta carotene w/minerals tablet Take 1 tablet by mouth at bedtime.   bismuth subsalicylate 124 MG chewable tablet Commonly known as: PEPTO BISMOL Chew 524 mg by mouth daily. Taking 1/2 tablet every morning   BLUE-EMU SUPER STRENGTH EX Apply 1 application topically 2 (two) times daily as needed (applied to right hand as needed for soreness/pain.).   cefpodoxime 200 MG tablet Commonly known as: VANTIN Take 1 tablet (200 mg total) by mouth 2 (two) times daily for 5 days.   doxycycline 100 MG tablet Commonly known as: ADOXA Take 1 tablet (100 mg total) by mouth 2 (two) times daily for 5 days.   multivitamin with minerals Tabs tablet Take 1 tablet by mouth daily.   OSTEO BI-FLEX TRIPLE STRENGTH PO Take 1 tablet by mouth in the morning and at bedtime.   saw palmetto 160 MG capsule Take 160 mg by mouth 2 (two) times daily.   simvastatin 80 MG tablet Commonly known as: ZOCOR Take 80 mg by mouth at bedtime.   Sutab [id number] MG Tabs Generic drug: Sodium Sulfate-Mag Sulfate-KCl At 5 PM take 12 tablets using the 8 oz cup provided in the kit drinking 5 cups of water and 5 hours before your procedure repeat the same process.   triamcinolone 55 MCG/ACT Aero nasal inhaler Commonly known as:  NASACORT Place 1 spray into the nose daily.   Vitamin D3 50 MCG (2000 UT) capsule Take 2,000 Units by mouth daily.       No Known Allergies  Consultations:     Procedures/Studies: CT Angio Chest PE W and/or Wo Contrast  Result Date: 04/01/2020 CLINICAL DATA:  Increased shortness of breath, recent diagnosis of pneumonia, generalized weakness, increasing difficulty walking short distances, on antibiotics and steroids, decreased appetite. Past history of colon cancer, laryngeal cancer, COPD, GERD, former smoker EXAM: CT ANGIOGRAPHY CHEST WITH CONTRAST TECHNIQUE: Multidetector CT imaging of the chest was performed using the standard protocol during bolus administration of intravenous contrast. Multiplanar CT image reconstructions and MIPs were obtained to evaluate the vascular anatomy. CONTRAST:  32mL OMNIPAQUE IOHEXOL 350 MG/ML SOLN IV COMPARISON:  06/17/2019 FINDINGS: Cardiovascular: Atherosclerotic calcifications aorta, coronary arteries and proximal great vessels. Aorta normal caliber without aneurysm or dissection. Heart unremarkable. No pericardial effusion. Pulmonary arteries adequately opacified and patent. No evidence of pulmonary embolism. Mediastinum/Nodes: Esophagus unremarkable. Base of cervical region normal appearance. Enlarged AP window lymph node 15 mm image 38 previously 8 mm. Additional enlarged AP window lymph node 11 mm image 36. Multiple additional normal sized mediastinal and LEFT hilar nodes.  Lungs/Pleura: Severe emphysematous changes. Minimal scarring in RIGHT upper lobe. Extensive consolidation in LEFT upper lobe and lingula LEFT lower lobe clear. Minimal LEFT pleural effusion. No pneumothorax. Consistent with pneumonia. Upper Abdomen: Questionable low-attenuation focus LEFT lobe liver 9 mm diameter image 85. Stomach decompressed with inadequate assessment of wall thickness. Remaining upper abdomen unremarkable. Musculoskeletal: No acute osseous findings. Review of the MIP  images confirms the above findings. IMPRESSION: No evidence of pulmonary embolism. Emphysematous changes consistent with COPD. Extensive consolidation involving the LEFT upper lobe and lingula consistent with pneumonia. Mildly prominent AP window lymph nodes, potentially reactive in the setting of pneumonia. Questionable nonspecific 9 mm low-attenuation LEFT lobe liver. Recommend attention to AP window lymph nodes and questionable liver lesion on follow-up imaging. Aortic Atherosclerosis (ICD10-I70.0) and Emphysema (ICD10-J43.9). Electronically Signed   By: Lavonia Dana M.D.   On: 04/01/2020 13:53      Subjective: Pt c/o fatigue    Discharge Exam: Vitals:   04/03/20 0754 04/03/20 1229  BP: 112/73 112/71  Pulse: 79 87  Resp: 16 18  Temp: (!) 97.4 F (36.3 C) (!) 97.5 F (36.4 C)  SpO2: 97% 100%   Vitals:   04/02/20 1940 04/03/20 0531 04/03/20 0754 04/03/20 1229  BP: 123/80 113/75 112/73 112/71  Pulse: 91 86 79 87  Resp: $Remo'20 18 16 18  'eZAgF$ Temp: 98.6 F (37 C) 98.4 F (36.9 C) (!) 97.4 F (36.3 C) (!) 97.5 F (36.4 C)  TempSrc: Oral Oral    SpO2: 97% 95% 97% 100%  Weight:      Height:        General: Pt is alert, awake, not in acute distress Cardiovascular: S1/S2 +, no rubs, no gallops Respiratory: decreased breath sounds b/l otherwise clear  Abdominal: Soft, NT, ND, bowel sounds + Extremities: no edema, no cyanosis    The results of significant diagnostics from this hospitalization (including imaging, microbiology, ancillary and laboratory) are listed below for reference.     Microbiology: Recent Results (from the past 240 hour(s))  Culture, blood (Routine X 2) w Reflex to ID Panel     Status: None (Preliminary result)   Collection Time: 04/01/20 11:21 AM   Specimen: BLOOD  Result Value Ref Range Status   Specimen Description BLOOD LEFT ASSIST CONTROL  Final   Special Requests   Final    BOTTLES DRAWN AEROBIC AND ANAEROBIC Blood Culture results may not be optimal due to  an excessive volume of blood received in culture bottles   Culture   Final    NO GROWTH 2 DAYS Performed at [facility], [address] [place], [place] [address]    Report Status PENDING  Incomplete  Culture, blood (Routine X 2) w Reflex to ID Panel     Status: None (Preliminary result)   Collection Time: 04/01/20 11:21 AM   Specimen: BLOOD  Result Value Ref Range Status   Specimen Description BLOOD RIGHT ASSIST CONTROL  Final   Special Requests   Final    BOTTLES DRAWN AEROBIC AND ANAEROBIC Blood Culture results may not be optimal due to an excessive volume of blood received in culture bottles   Culture   Final    NO GROWTH 2 DAYS Performed at [facility], [place], [place] [address]    Report Status PENDING  Incomplete  SARS CORONAVIRUS 2 (TAT 6-24 HRS) Nasopharyngeal Nasopharyngeal Swab     Status: None   Collection Time: 04/01/20 11:21 AM   Specimen: Nasopharyngeal Swab  Result Value Ref Range Status   SARS Coronavirus 2 NEGATIVE NEGATIVE Final    Comment: (NOTE) SARS-CoV-2 target nucleic acids are NOT DETECTED.  The SARS-CoV-2 RNA is generally detectable in upper and lower respiratory specimens during the acute phase of infection. Negative results do not preclude SARS-CoV-2 infection, do not rule out co-infections with other pathogens, and should not be used as the sole basis for treatment or other patient management decisions. Negative results must be combined with clinical observations, patient history, and epidemiological information. The expected result is Negative.  Fact Sheet for Patients: SugarRoll.be  Fact Sheet for Healthcare Providers: https://www.woods-mathews.com/  This test is not yet approved or cleared by the [place] FDA and  has been authorized for detection and/or diagnosis of SARS-CoV-2 by FDA under an Emergency Use Authorization (EUA). This EUA will remain  in  effect (meaning this test can be used) for the duration of the COVID-19 declaration under Se ction 564(b)(1) of the Act, 21 U.S.C. section 360bbb-3(b)(1), unless the authorization is terminated or revoked sooner.  Performed at [facility], [place] [address] [place], [place] [address]      Labs: BNP (last 3 results) Recent Labs    04/01/20 1038  BNP 84.1   Basic Metabolic Panel: Recent Labs  Lab 04/01/20 1038 04/03/20 0449  NA 135 134*  K 4.0 4.1  CL 97* 102  CO2 26 23  GLUCOSE 134* 121*  BUN 18 22  CREATININE 1.06 1.18  CALCIUM 9.0 9.0   Liver Function Tests: Recent Labs  Lab 04/01/20 1038  AST 14*  ALT 25  ALKPHOS 65  BILITOT 0.5  PROT 7.5  ALBUMIN 3.2*   Recent Labs  Lab 04/01/20 1038  LIPASE 26   No results for input(s): AMMONIA in the last 168 hours. CBC: Recent Labs  Lab 04/01/20 1038 04/02/20 0348 04/03/20 0449  WBC 23.7* 21.4* 21.1*  NEUTROABS 19.8*  --   --   HGB 12.9* 11.8* 12.2*  HCT 39.4 35.5* 36.6*  MCV 89.1 88.3 87.4  PLT 735* 738* 747*   Cardiac Enzymes: No results for input(s): CKTOTAL, CKMB, CKMBINDEX, TROPONINI in the last 168 hours. BNP: Invalid input(s): POCBNP CBG: No results for input(s): GLUCAP in the last 168 hours. D-Dimer No results for input(s): DDIMER in the last 72 hours. Hgb A1c No results for input(s): HGBA1C in the last 72 hours. Lipid Profile No results for input(s): CHOL, HDL, LDLCALC, TRIG, CHOLHDL, LDLDIRECT in the last 72 hours. Thyroid function studies No results for input(s): TSH, T4TOTAL, T3FREE, THYROIDAB in the last 72 hours.  Invalid input(s): FREET3 Anemia work up No results for input(s): VITAMINB12, FOLATE, FERRITIN, TIBC, IRON, RETICCTPCT in the last 72 hours. Urinalysis    Component Value Date/Time   COLORURINE YELLOW 03/22/2020 1009   APPEARANCEUR CLEAR 03/22/2020 1009   APPEARANCEUR Clear 08/17/2019 0920   LABSPEC 1.020 03/22/2020 1009   PHURINE 5.5 03/22/2020 1009   GLUCOSEU  NEGATIVE 03/22/2020 1009   HGBUR NEGATIVE 03/22/2020 1009   BILIRUBINUR SMALL (A) 03/22/2020 1009   BILIRUBINUR Negative 08/17/2019 0920   KETONESUR 40 (A) 03/22/2020 1009   PROTEINUR 30 (A) 03/22/2020 1009   NITRITE NEGATIVE 03/22/2020 1009   LEUKOCYTESUR NEGATIVE 03/22/2020 1009   Sepsis Labs Invalid input(s): PROCALCITONIN,  WBC,  LACTICIDVEN Microbiology Recent Results (from the past 240 hour(s))  Culture, blood (Routine X 2) w Reflex to ID Panel     Status: None (Preliminary result)   Collection Time: 04/01/20 11:21 AM  Specimen: BLOOD  Result Value Ref Range Status   Specimen Description BLOOD LEFT ASSIST CONTROL  Final   Special Requests   Final    BOTTLES DRAWN AEROBIC AND ANAEROBIC Blood Culture results may not be optimal due to an excessive volume of blood received in culture bottles   Culture   Final    NO GROWTH 2 DAYS Performed at [facility], [address], [place], [place] [address]    Report Status PENDING  Incomplete  Culture, blood (Routine X 2) w Reflex to ID Panel     Status: None (Preliminary result)   Collection Time: 04/01/20 11:21 AM   Specimen: BLOOD  Result Value Ref Range Status   Specimen Description BLOOD RIGHT ASSIST CONTROL  Final   Special Requests   Final    BOTTLES DRAWN AEROBIC AND ANAEROBIC Blood Culture results may not be optimal due to an excessive volume of blood received in culture bottles   Culture   Final    NO GROWTH 2 DAYS Performed at [facility], [address], [place], [place] [address]    Report Status PENDING  Incomplete  SARS CORONAVIRUS 2 (TAT 6-24 HRS) Nasopharyngeal Nasopharyngeal Swab     Status: None   Collection Time: 04/01/20 11:21 AM   Specimen: Nasopharyngeal Swab  Result Value Ref Range Status   SARS Coronavirus 2 NEGATIVE NEGATIVE Final    Comment: (NOTE) SARS-CoV-2 target nucleic acids are NOT DETECTED.  The SARS-CoV-2 RNA is generally detectable in upper and lower respiratory  specimens during the acute phase of infection. Negative results do not preclude SARS-CoV-2 infection, do not rule out co-infections with other pathogens, and should not be used as the sole basis for treatment or other patient management decisions. Negative results must be combined with clinical observations, patient history, and epidemiological information. The expected result is Negative.  Fact Sheet for Patients: SugarRoll.be  Fact Sheet for Healthcare Providers: https://www.woods-mathews.com/  This test is not yet approved or cleared by the [place] FDA and  has been authorized for detection and/or diagnosis of SARS-CoV-2 by FDA under an Emergency Use Authorization (EUA). This EUA will remain  in effect (meaning this test can be used) for the duration of the COVID-19 declaration under Se ction 564(b)(1) of the Act, 21 U.S.C. section 360bbb-3(b)(1), unless the authorization is terminated or revoked sooner.  Performed at [facility], [place] [address], [place], [place] [address]      Time coordinating discharge: Over 30 minutes  SIGNED:   Wyvonnia Dusky, MD  [facility] 04/03/2020, 12:42 PM Pager   If 7PM-7AM, please contact night-coverage

## 2020-04-06 LAB — CULTURE, BLOOD (ROUTINE X 2)
Culture: NO GROWTH
Culture: NO GROWTH

## 2020-04-13 DIAGNOSIS — D490 Neoplasm of unspecified behavior of digestive system: Secondary | ICD-10-CM | POA: Diagnosis not present

## 2020-04-13 DIAGNOSIS — Z8701 Personal history of pneumonia (recurrent): Secondary | ICD-10-CM | POA: Diagnosis not present

## 2020-04-13 DIAGNOSIS — R7301 Impaired fasting glucose: Secondary | ICD-10-CM | POA: Diagnosis not present

## 2020-04-13 DIAGNOSIS — J410 Simple chronic bronchitis: Secondary | ICD-10-CM | POA: Diagnosis not present

## 2020-04-13 DIAGNOSIS — R7303 Prediabetes: Secondary | ICD-10-CM | POA: Diagnosis not present

## 2020-04-13 DIAGNOSIS — I2510 Atherosclerotic heart disease of native coronary artery without angina pectoris: Secondary | ICD-10-CM | POA: Diagnosis not present

## 2020-04-16 ENCOUNTER — Other Ambulatory Visit: Payer: Self-pay | Admitting: Oncology

## 2020-04-16 ENCOUNTER — Telehealth: Payer: Self-pay | Admitting: Oncology

## 2020-04-16 NOTE — Telephone Encounter (Signed)
Patient called to report that while in the ED at the end of March, there was a "lesion" found on his liver. I told patient to expect a call early next week with an update on the next course of action and that Dr. Janese Banks is aware.

## 2020-04-16 NOTE — Progress Notes (Signed)
tumor °

## 2020-04-19 ENCOUNTER — Inpatient Hospital Stay: Payer: Medicare PPO | Attending: Oncology

## 2020-04-19 ENCOUNTER — Telehealth: Payer: Self-pay | Admitting: *Deleted

## 2020-04-19 NOTE — Progress Notes (Signed)
Tumor Board Documentation  Kirk Rodriguez was presented by Dr Janese Banks at our Tumor Board on 04/19/2020, which included representatives from medical oncology,radiation oncology,internal medicine,navigation,pathology,radiology,surgical,pharmacy,genetics,research,palliative care,pulmonology.  Kirk Rodriguez currently presents as a current patient,for discussion with history of the following treatments: active survellience.  Additionally, we reviewed previous medical and familial history, history of present illness, and recent lab results along with all available histopathologic and imaging studies. The tumor board considered available treatment options and made the following recommendations: Active surveillance (MRI Liverin 3 months)    The following procedures/referrals were also placed: No orders of the defined types were placed in this encounter.   Clinical Trial Status: not discussed   Staging used: Not Applicable  National site-specific guidelines   were discussed with respect to the case.  Tumor board is a meeting of clinicians from various specialty areas who evaluate and discuss patients for whom a multidisciplinary approach is being considered. Final determinations in the plan of care are those of the provider(s). The responsibility for follow up of recommendations given during tumor board is that of the provider.   Today's extended care, comprehensive team conference, Kirk Rodriguez was not present for the discussion and was not examined.   Multidisciplinary Tumor Board is a multidisciplinary case peer review process.  Decisions discussed in the Multidisciplinary Tumor Board reflect the opinions of the specialists present at the conference without having examined the patient.  Ultimately, treatment and diagnostic decisions rest with the primary provider(s) and the patient.

## 2020-04-19 NOTE — Telephone Encounter (Signed)
Called pt and left message that dr Janese Banks did take his ct scan to tumor conference in regards to liver lesion and it was determined that pt will need  mri abdomen with and without contrast after 3 months and see him for in person for md visit. Please give [place] a call and see if you are agreeable and we will get appt set up, left my direct phone number.

## 2020-04-20 ENCOUNTER — Other Ambulatory Visit: Payer: Self-pay

## 2020-04-20 DIAGNOSIS — K769 Liver disease, unspecified: Secondary | ICD-10-CM

## 2020-05-07 ENCOUNTER — Telehealth: Payer: Self-pay

## 2020-05-07 NOTE — Telephone Encounter (Signed)
Patient has pneumonia and has had to be hospitalized. Is home now but wants to cancel the 05/09/20 procedure until he's feeling better.

## 2020-05-07 NOTE — Telephone Encounter (Signed)
Called patient back to let him know that I had called the endoscopic unit to cancel his procedure per his request. He stated that he would call us back whenever he sees his PCP in a month.

## 2020-05-09 ENCOUNTER — Encounter: Admission: RE | Payer: Self-pay | Source: Home / Self Care

## 2020-05-09 ENCOUNTER — Ambulatory Visit: Admission: RE | Admit: 2020-05-09 | Payer: Medicare PPO | Source: Home / Self Care | Admitting: Gastroenterology

## 2020-05-09 SURGERY — COLONOSCOPY WITH PROPOFOL
Anesthesia: General

## 2020-05-22 DIAGNOSIS — R490 Dysphonia: Secondary | ICD-10-CM | POA: Diagnosis not present

## 2020-05-22 DIAGNOSIS — Z8521 Personal history of malignant neoplasm of larynx: Secondary | ICD-10-CM | POA: Diagnosis not present

## 2020-05-28 NOTE — Telephone Encounter (Signed)
After looking to see if patient had done his colonoscopy, I saw that he had called on May 07, 2020 to cancel his procedure with Dr. Bonna Gains. According to the message, he is to call us back to reschedule his colonoscopy after seeing his PCP in a month.

## 2020-06-06 DIAGNOSIS — E782 Mixed hyperlipidemia: Secondary | ICD-10-CM | POA: Diagnosis not present

## 2020-06-06 DIAGNOSIS — Z85038 Personal history of other malignant neoplasm of large intestine: Secondary | ICD-10-CM | POA: Diagnosis not present

## 2020-06-06 DIAGNOSIS — Z8701 Personal history of pneumonia (recurrent): Secondary | ICD-10-CM | POA: Diagnosis not present

## 2020-06-06 DIAGNOSIS — I7090 Unspecified atherosclerosis: Secondary | ICD-10-CM | POA: Diagnosis not present

## 2020-06-06 DIAGNOSIS — R7303 Prediabetes: Secondary | ICD-10-CM | POA: Diagnosis not present

## 2020-06-20 ENCOUNTER — Telehealth: Payer: Self-pay

## 2020-06-20 NOTE — Telephone Encounter (Signed)
Patient scheduled for lung screening CT scan on Tuesday June 21 @ 1030. He has [employer] still and quit smoking 4 years ago. He is aware of location.

## 2020-06-21 ENCOUNTER — Other Ambulatory Visit: Payer: Self-pay | Admitting: *Deleted

## 2020-06-21 DIAGNOSIS — Z122 Encounter for screening for malignant neoplasm of respiratory organs: Secondary | ICD-10-CM

## 2020-06-21 DIAGNOSIS — Z87891 Personal history of nicotine dependence: Secondary | ICD-10-CM

## 2020-06-21 NOTE — Progress Notes (Signed)
Contacted and scheduled for annual lung screening scan. Patient is a former smoker, quit 2017, 79.5 pack year history

## 2020-06-26 ENCOUNTER — Other Ambulatory Visit: Payer: Self-pay

## 2020-06-26 ENCOUNTER — Ambulatory Visit
Admission: RE | Admit: 2020-06-26 | Discharge: 2020-06-26 | Disposition: A | Discharge status: Home / Self Care | Payer: Medicare PPO | Source: Ambulatory Visit | Attending: Nurse Practitioner | Admitting: Nurse Practitioner

## 2020-06-26 DIAGNOSIS — Z87891 Personal history of nicotine dependence: Secondary | ICD-10-CM | POA: Diagnosis not present

## 2020-06-26 DIAGNOSIS — Z122 Encounter for screening for malignant neoplasm of respiratory organs: Secondary | ICD-10-CM | POA: Insufficient documentation

## 2020-07-05 ENCOUNTER — Ambulatory Visit
Admission: RE | Admit: 2020-07-05 | Discharge: 2020-07-05 | Disposition: A | Discharge status: Home / Self Care | Payer: Medicare PPO | Source: Ambulatory Visit | Attending: Oncology | Admitting: Oncology

## 2020-07-05 ENCOUNTER — Other Ambulatory Visit: Payer: Self-pay

## 2020-07-05 DIAGNOSIS — K7689 Other specified diseases of liver: Secondary | ICD-10-CM | POA: Diagnosis not present

## 2020-07-05 DIAGNOSIS — K769 Liver disease, unspecified: Secondary | ICD-10-CM | POA: Insufficient documentation

## 2020-07-05 MED ORDER — GADOBUTROL 1 MMOL/ML IV SOLN
8.0000 mL | Freq: Once | INTRAVENOUS | Status: AC | PRN
  Administered 2020-07-05: 7.5 mL via INTRAVENOUS

## 2020-07-06 ENCOUNTER — Inpatient Hospital Stay: Payer: Medicare PPO | Attending: Oncology | Admitting: Oncology

## 2020-07-06 ENCOUNTER — Encounter: Payer: Self-pay | Admitting: Oncology

## 2020-07-06 VITALS — BP 103/72 | HR 95 | Temp 97.4°F | Resp 17

## 2020-07-06 DIAGNOSIS — I2510 Atherosclerotic heart disease of native coronary artery without angina pectoris: Secondary | ICD-10-CM | POA: Diagnosis not present

## 2020-07-06 DIAGNOSIS — Z79899 Other long term (current) drug therapy: Secondary | ICD-10-CM | POA: Insufficient documentation

## 2020-07-06 DIAGNOSIS — Z801 Family history of malignant neoplasm of trachea, bronchus and lung: Secondary | ICD-10-CM | POA: Insufficient documentation

## 2020-07-06 DIAGNOSIS — I700 Atherosclerosis of aorta: Secondary | ICD-10-CM | POA: Insufficient documentation

## 2020-07-06 DIAGNOSIS — Z122 Encounter for screening for malignant neoplasm of respiratory organs: Secondary | ICD-10-CM | POA: Diagnosis not present

## 2020-07-06 DIAGNOSIS — E785 Hyperlipidemia, unspecified: Secondary | ICD-10-CM | POA: Diagnosis not present

## 2020-07-06 DIAGNOSIS — K7689 Other specified diseases of liver: Secondary | ICD-10-CM | POA: Insufficient documentation

## 2020-07-06 DIAGNOSIS — K219 Gastro-esophageal reflux disease without esophagitis: Secondary | ICD-10-CM | POA: Diagnosis not present

## 2020-07-06 DIAGNOSIS — J439 Emphysema, unspecified: Secondary | ICD-10-CM | POA: Insufficient documentation

## 2020-07-06 DIAGNOSIS — J449 Chronic obstructive pulmonary disease, unspecified: Secondary | ICD-10-CM | POA: Insufficient documentation

## 2020-07-06 DIAGNOSIS — Z87891 Personal history of nicotine dependence: Secondary | ICD-10-CM | POA: Insufficient documentation

## 2020-07-06 DIAGNOSIS — C187 Malignant neoplasm of sigmoid colon: Secondary | ICD-10-CM | POA: Diagnosis not present

## 2020-07-06 DIAGNOSIS — R011 Cardiac murmur, unspecified: Secondary | ICD-10-CM | POA: Diagnosis not present

## 2020-07-06 NOTE — Progress Notes (Signed)
No new concerns today 

## 2020-07-08 NOTE — Progress Notes (Signed)
Hematology/Oncology Consult note [facility]  [phone] [phone]  Patient Care Team: Barbaraann Boys, MD as PCP - General (Family Medicine) Clent Jacks, RN as Oncology Nurse Navigator Sindy Guadeloupe, MD as Consulting Physician (Oncology) Ronny Bacon, MD as Consulting Physician (General Surgery)   Name of the patient: Kirk Rodriguez  [id number]  03-08-1945   Date of visit: 07/08/20  Diagnosis- 1. Stage 1 colon cancer 2. Liver cyst  Chief complaint/ Reason for visit- discuss mri results and further management  Heme/Onc history: Patient is a 75 year old male diagnosed with stage I colon cancer s/p low anterior resection with Dr. Christian Mate in June 2021.Final pathology showed invasive colorectal adenocarcinoma moderately differentiated with negative margins.  2.3 x 1.7 x 0.7 cm.  No lymphovascular invasion.  16 lymph nodes negative for malignancy.  PT2PN0.  He does not require any surveillance scans without labs for this.  Patient underwent low-dose lung cancer screening in June 2022 which showed a 5.9 mm left lower lobe lung nodule.  Prevascular nodes up to 1 cm.  Prior to that he had a CTA in March 2022 which showed a questionable low-attenuation focus in the left lobe of the liver.  This was followed by an MRI abdomen with and without contrast in June 2022 which showed a simple cyst near the junction of segment 2 and 4 of the liver but otherwise unremarkable  Interval history-patient is doing well presently and denies any specific complaints at this time  ECOG PS- 1 Pain scale- 0  Review of systems- Review of Systems  Constitutional:  Negative for chills, fever, malaise/fatigue and weight loss.  HENT:  Negative for congestion, ear discharge and nosebleeds.   Eyes:  Negative for blurred vision.  Respiratory:  Negative for cough, hemoptysis, sputum production, shortness of breath and wheezing.   Cardiovascular:  Negative for  chest pain, palpitations, orthopnea and claudication.  Gastrointestinal:  Negative for abdominal pain, blood in stool, constipation, diarrhea, heartburn, melena, nausea and vomiting.  Genitourinary:  Negative for dysuria, flank pain, frequency, hematuria and urgency.  Musculoskeletal:  Negative for back pain, joint pain and myalgias.  Skin:  Negative for rash.  Neurological:  Negative for dizziness, tingling, focal weakness, seizures, weakness and headaches.  Endo/Heme/Allergies:  Does not bruise/bleed easily.  Psychiatric/Behavioral:  Negative for depression and suicidal ideas. The patient does not have insomnia.     No Known Allergies   Past Medical History:  Diagnosis Date   Allergy    Cancer ([place])    Larynx--carcinoma in situ   Colon cancer (HCC)    COPD (chronic obstructive pulmonary disease) (HCC)    no inhalers   GERD (gastroesophageal reflux disease)    no meds   Headache    h/o migraines   Heart murmur    as a child   Hyperlipidemia    Wears dentures    full upper, partial lower     Past Surgical History:  Procedure Laterality Date   COLONOSCOPY WITH PROPOFOL N/A 06/02/2019   Procedure: COLONOSCOPY WITH PROPOFOL;  Surgeon: Virgel Manifold, MD;  Location: [facility];  Service: Endoscopy;  Laterality: N/A;   CYSTOSCOPY N/A 06/29/2019   Procedure: CYSTOSCOPY;  Surgeon: Hollice Espy, MD;  Location: [facility];  Service: Urology;  Laterality: N/A;   DIRECT LARYNGOSCOPY Right 04/03/2016   Procedure: DIRECT MICROLARYNGOSCOPY WITH EXCISON RIGHT VOCAL CORD LESION;  Surgeon: Margaretha Sheffield, MD;  Location: [place];  Service: ENT;  Laterality: Right;  RIGHT  INGUINAL HERNIA REPAIR Bilateral 12/18/2015   Procedure: LAPAROSCOPIC BILATERAL INGUINAL HERNIA REPAIR;  Surgeon: Hubbard Robinson, MD;  Location: [facility];  Service: General;  Laterality: Bilateral;   MINOR EXCISION OF ORAL LESION Left 03/06/2016   Procedure: direct microlaryngoscopy with excision left  vocal cord lesion;  Surgeon: Margaretha Sheffield, MD;  Location: [place];  Service: ENT;  Laterality: Left;  left vocal cord lesion   TRIGGER FINGER RELEASE Left 2016   VASECTOMY Bilateral 1978   XI ROBOTIC ASSISTED LOWER ANTERIOR RESECTION N/A 06/29/2019   Procedure: XI ROBOTIC ASSISTED LOWER ANTERIOR RESECTION;  Surgeon: Ronny Bacon, MD;  Location: [facility];  Service: General;  Laterality: N/A;    Social History   Socioeconomic History   Marital status: Married    Spouse name: Not on file   Number of children: Not on file   Years of education: Not on file   Highest education level: Not on file  Occupational History   Not on file  Tobacco Use   Smoking status: Former    Packs/day: 1.50    Years: 53.00    Pack years: 79.50    Types: Cigarettes    Quit date: 04/18/2015    Years since quitting: 5.2   Smokeless tobacco: Former  Scientific laboratory technician Use: Never used  Substance and Sexual Activity   Alcohol use: Not Currently    Comment: 6-12 beers in 1 year   Drug use: No   Sexual activity: Not Currently  Other Topics Concern   Not on file  Social History Narrative   Not on file   Social Determinants of Health   Financial Resource Strain: Not on file  Food Insecurity: Not on file  Transportation Needs: Not on file  Physical Activity: Not on file  Stress: Not on file  Social Connections: Not on file  Intimate Partner Violence: Not on file    Family History  Problem Relation Age of Onset   Lung cancer Father    Heart disease Father    Hypertension Father    Polycythemia Mother    Heart disease Mother      Current Outpatient Medications:    acetaminophen (TYLENOL) 500 MG tablet, Take 500 mg by mouth every 6 (six) hours as needed (pain.). , Disp: , Rfl:    ALEVE PM 220-25 MG TABS, Take 1 tablet by mouth daily., Disp: , Rfl:    beta carotene w/minerals (OCUVITE) tablet, Take 1 tablet by mouth at bedtime., Disp: , Rfl:    bismuth subsalicylate (PEPTO BISMOL)  262 MG chewable tablet, Chew 524 mg by mouth daily. Taking 1/2 tablet every morning, Disp: , Rfl:    Cholecalciferol (VITAMIN D3) 2000 units capsule, Take 2,000 Units by mouth daily. , Disp: , Rfl:    Liniments (BLUE-EMU SUPER STRENGTH EX), Apply 1 application topically 2 (two) times daily as needed (applied to right hand as needed for soreness/pain.)., Disp: , Rfl:    Misc Natural Products (OSTEO BI-FLEX TRIPLE STRENGTH PO), Take 1 tablet by mouth in the morning and at bedtime., Disp: , Rfl:    Multiple Vitamin (MULTIVITAMIN WITH MINERALS) TABS tablet, Take 1 tablet by mouth daily., Disp: , Rfl:    saw palmetto 160 MG capsule, Take 160 mg by mouth 2 (two) times daily., Disp: , Rfl:    simvastatin (ZOCOR) 80 MG tablet, Take 80 mg by mouth at bedtime. , Disp: , Rfl:    Sodium Sulfate-Mag Sulfate-KCl (SUTAB) [phone] MG TABS, At 5  PM take 12 tablets using the 8 oz cup provided in the kit drinking 5 cups of water and 5 hours before your procedure repeat the same process., Disp: 24 tablet, Rfl: 0   triamcinolone (NASACORT) 55 MCG/ACT AERO nasal inhaler, Place 1 spray into the nose daily. , Disp: , Rfl:    albuterol (VENTOLIN HFA) 108 (90 Base) MCG/ACT inhaler, Inhale 2 puffs into the lungs every 6 (six) hours as needed for wheezing or shortness of breath. (Patient not taking: Reported on 07/06/2020), Disp: , Rfl:    benzonatate (TESSALON) 200 MG capsule, Take 200 mg by mouth 3 (three) times daily as needed for cough. (Patient not taking: Reported on 07/06/2020), Disp: , Rfl:   Physical exam:  Vitals:   07/06/20 1311  BP: 103/72  Pulse: 95  Resp: 17  Temp: (!) 97.4 F (36.3 C)  TempSrc: Tympanic  SpO2: 100%   Physical Exam Cardiovascular:     Rate and Rhythm: Normal rate and regular rhythm.     Heart sounds: Normal heart sounds.  Pulmonary:     Effort: Pulmonary effort is normal.     Breath sounds: Normal breath sounds.  Abdominal:     General: Bowel sounds are normal.     Palpations:  Abdomen is soft.  Skin:    General: Skin is warm and dry.  Neurological:     Mental Status: He is alert and oriented to person, place, and time.     CMP Latest Ref Rng & Units 04/03/2020  Glucose 70 - 99 mg/dL 121(H)  BUN 8 - 23 mg/dL 22  Creatinine 0.61 - 1.24 mg/dL 1.18  Sodium 135 - 145 mmol/L 134(L)  Potassium 3.5 - 5.1 mmol/L 4.1  Chloride 98 - 111 mmol/L 102  CO2 22 - 32 mmol/L 23  Calcium 8.9 - 10.3 mg/dL 9.0  Total Protein 6.5 - 8.1 g/dL -  Total Bilirubin 0.3 - 1.2 mg/dL -  Alkaline Phos 38 - 126 U/L -  AST 15 - 41 U/L -  ALT 0 - 44 U/L -   CBC Latest Ref Rng & Units 04/03/2020  WBC 4.0 - 10.5 K/uL 21.1(H)  Hemoglobin 13.0 - 17.0 g/dL 12.2(L)  Hematocrit 39.0 - 52.0 % 36.6(L)  Platelets 150 - 400 K/uL 747(H)    No images are attached to the encounter.  MR Abdomen W Wo Contrast  Result Date: 07/06/2020 CLINICAL DATA:  Liver lesion on insert CT a chest 04/01/2020 EXAM: MRI ABDOMEN WITHOUT AND WITH CONTRAST TECHNIQUE: Multiplanar multisequence MR imaging of the abdomen was performed both before and after the administration of intravenous contrast. CONTRAST:  7.52m GADAVIST GADOBUTROL 1 MMOL/ML IV SOLN COMPARISON:  Insert CT a chest 04/01/2020. CT lung cancer screening 09/10/2017 FINDINGS: Lower chest: Unremarkable. Hepatobiliary: 10 mm T2 hyperintense lesion identified in the liver near the junction of segments II and IV. This has low signal intensity on T1 imaging and shows no restricted diffusion or enhancement after IV contrast administration. Imaging features most compatible with a tiny simple cyst. In retrospect, this lesion was subtle but visible on CT from 09/10/2017, also supporting benign etiology. No suspicious enhancing abnormality in the liver on today's study. There is no evidence for gallstones, gallbladder wall thickening, or pericholecystic fluid. No intrahepatic or extrahepatic biliary dilation. Pancreas: No focal mass lesion. No dilatation of the main duct. No  intraparenchymal cyst. No peripancreatic edema. Spleen:  No splenomegaly. No focal mass lesion. Adrenals/Urinary Tract: No adrenal nodule or mass. Kidneys unremarkable. Stomach/Bowel: Stomach is  unremarkable. No gastric wall thickening. No evidence of outlet obstruction. Duodenum is normally positioned as is the ligament of Treitz. No small bowel wall thickening. No small bowel or colonic dilatation within the visualized abdomen. Vascular/Lymphatic: No abdominal aortic aneurysm. There is no gastrohepatic or hepatoduodenal ligament lymphadenopathy. No retroperitoneal or mesenteric lymphadenopathy. Other:  No intraperitoneal free fluid. Musculoskeletal: No focal suspicious marrow enhancement within the visualized bony anatomy. IMPRESSION: Subtle liver finding on CTA Chest of 04/01/2020 represents a 10 mm simple cyst near the junction of segments II and IV. Otherwise unremarkable MRI of the abdomen. Electronically Signed   By: Misty Stanley M.D.   On: 07/06/2020 10:05   CT CHEST LUNG CANCER SCREENING LOW DOSE WO CONTRAST  Result Date: 06/27/2020 CLINICAL DATA:  Ex-smoker, quitting 5 years ago. Seventy-nine pack-year history. Vocal cord cancer with radiation therapy in 2018. Colon cancer with resection last year. EXAM: CT CHEST WITHOUT CONTRAST LOW-DOSE FOR LUNG CANCER SCREENING TECHNIQUE: Multidetector CT imaging of the chest was performed following the standard protocol without IV contrast. COMPARISON:  04/01/2020 CTA chest. Lung cancer screening CT of 09/29/2018. FINDINGS: Cardiovascular: Aortic atherosclerosis. Tortuous thoracic aorta. Normal heart size, without pericardial effusion. Lad and right coronary artery calcification. Mediastinum/Nodes: Hilar regions poorly evaluated without intravenous contrast. Prevascular nodes of up to 1.0 cm on 22/2, decreased from 1.4 cm on 04/01/2020. Lungs/Pleura: No pleural fluid. Advanced bullous emphysema. Left upper lobe volume loss, consolidation, and bronchiectasis  anteriorly are likely the sequelae of pneumonia on 04/01/2020 CTA. This limits evaluation for left upper lobe lung nodule or mass. A left lower lobe pulmonary nodule of volume derived equivalent diameter 5.9 mm on 133/3 is new since the prior. Other scattered smaller pulmonary nodules are relatively similar. Upper Abdomen: Normal imaged portions of the liver, spleen, stomach, gallbladder, pancreas, adrenal glands, kidneys. Musculoskeletal: Midthoracic spondylosis. IMPRESSION: 1. Lung-RADS 3, probably benign findings. Short-term follow-up in 6 months is recommended with repeat low-dose chest CT without contrast (please use the following order, "CT CHEST LCS NODULE FOLLOW-UP W/O CM"). New left upper lobe pulmonary nodule of volume derived equivalent diameter 5.9 mm. 2. Left upper lobe volume loss, consolidation, and bronchiectasis are likely the sequelae of pneumonia on the interval CTA of the chest of 04/01/2020. This limits sensitivity for left upper lobe nodule/mass. 3. Aortic atherosclerosis (ICD10-I70.0), coronary artery atherosclerosis and emphysema (ICD10-J43.9). 4. Prevascular node is decreased in size since the prior, presumably reactive. Electronically Signed   By: Abigail Miyamoto M.D.   On: 06/27/2020 10:06     Assessment and plan- Patient is a 75 y.o. male with history of stage I colon cancer here to discuss MRI abdomen results and further management  MRI abdomen which was done as a follow-up for his prior CTA of the chest in March 2022 showed that the questionable lesion in the liver was a simple cyst and it does not require any follow-up at this time.  Patient is due for low-dose lung cancer screening CT in December 2022 and I will see him following that.  Stage I colon cancer: This does not require any labs or surveillance imaging at this time.  He will continue to follow-up with GI for surveillance colonoscopy   Visit Diagnosis 1. Malignant neoplasm of sigmoid colon ([place])   2. Screening for  malignant neoplasm of respiratory organ      Dr. Randa Evens, MD, MPH [facility] [phone] 07/08/2020 10:17 AM

## 2020-07-10 ENCOUNTER — Encounter: Payer: Self-pay | Admitting: Gastroenterology

## 2020-07-10 ENCOUNTER — Telehealth (INDEPENDENT_AMBULATORY_CARE_PROVIDER_SITE_OTHER): Payer: Medicare PPO | Admitting: Gastroenterology

## 2020-07-10 DIAGNOSIS — Z85038 Personal history of other malignant neoplasm of large intestine: Secondary | ICD-10-CM

## 2020-07-10 NOTE — Progress Notes (Signed)
Gastroenterology Pre-Procedure Review  Request Date: 07/25/2020 Requesting Physician: Dr. Bonna Gains   PATIENT REVIEW QUESTIONS: The patient responded to the following health history questions as indicated:    1. Are you having any GI issues? yes (diarrhea once week due to partial removal of colon) 2. Do you have a personal history of Polyps? yes (Colonoscopy 06/02/2019) 3. Do you have a family history of Colon Cancer or Polyps? no 4. Diabetes Mellitus? no 5. Joint replacements in the past 12 months?no 6. Major health problems in the past 3 months?no 7. Any artificial heart valves, MVP, or defibrillator?no    MEDICATIONS & ALLERGIES:    Patient reports the following regarding taking any anticoagulation/antiplatelet therapy:   Plavix, Coumadin, Eliquis, Xarelto, Lovenox, Pradaxa, Brilinta, or Effient? no Aspirin? no  Patient confirms/reports the following medications:  Current Outpatient Medications  Medication Sig Dispense Refill   acetaminophen (TYLENOL) 500 MG tablet Take 500 mg by mouth every 6 (six) hours as needed (pain.).      ALEVE PM 220-25 MG TABS Take 1 tablet by mouth daily.     beta carotene w/minerals (OCUVITE) tablet Take 1 tablet by mouth at bedtime.     Cholecalciferol (VITAMIN D3) 2000 units capsule Take 2,000 Units by mouth daily.      Misc Natural Products (OSTEO BI-FLEX TRIPLE STRENGTH PO) Take 1 tablet by mouth in the morning and at bedtime.     Multiple Vitamin (MULTIVITAMIN WITH MINERALS) TABS tablet Take 1 tablet by mouth daily.     simvastatin (ZOCOR) 80 MG tablet Take 80 mg by mouth at bedtime.      triamcinolone (NASACORT) 55 MCG/ACT AERO nasal inhaler Place 1 spray into the nose daily.      albuterol (VENTOLIN HFA) 108 (90 Base) MCG/ACT inhaler Inhale 2 puffs into the lungs every 6 (six) hours as needed for wheezing or shortness of breath. (Patient not taking: No sig reported)     benzonatate (TESSALON) 200 MG capsule Take 200 mg by mouth 3 (three) times  daily as needed for cough. (Patient not taking: No sig reported)     bismuth subsalicylate (PEPTO BISMOL) 262 MG chewable tablet Chew 524 mg by mouth daily. Taking 1/2 tablet every morning (Patient not taking: Reported on 07/10/2020)     Liniments (BLUE-EMU SUPER STRENGTH EX) Apply 1 application topically 2 (two) times daily as needed (applied to right hand as needed for soreness/pain.). (Patient not taking: Reported on 07/10/2020)     saw palmetto 160 MG capsule Take 160 mg by mouth 2 (two) times daily. (Patient not taking: Reported on 07/10/2020)     Sodium Sulfate-Mag Sulfate-KCl (SUTAB) [phone] MG TABS At 5 PM take 12 tablets using the 8 oz cup provided in the kit drinking 5 cups of water and 5 hours before your procedure repeat the same process. 24 tablet 0   No current facility-administered medications for this visit.    Patient confirms/reports the following allergies:  No Known Allergies  No orders of the defined types were placed in this encounter.   AUTHORIZATION INFORMATION Primary Insurance: 1D#: Group #:  Secondary Insurance: 1D#: Group #:  SCHEDULE INFORMATION: Date: 07/25/2020 Time: Location: [place]

## 2020-07-24 ENCOUNTER — Telehealth: Payer: Self-pay | Admitting: Gastroenterology

## 2020-07-24 DIAGNOSIS — Z20822 Contact with and (suspected) exposure to covid-19: Secondary | ICD-10-CM | POA: Diagnosis not present

## 2020-07-24 DIAGNOSIS — Z03818 Encounter for observation for suspected exposure to other biological agents ruled out: Secondary | ICD-10-CM | POA: Diagnosis not present

## 2020-07-24 NOTE — Telephone Encounter (Signed)
Patient feeling Dizzy and off balanced. Going to [facility] to get a rapid covid test. If positive results , patient will call us back to reschedule procedure schedule on 07/24/2020.

## 2020-07-24 NOTE — Telephone Encounter (Signed)
Procedure cancelled as requested.

## 2020-07-24 NOTE — Telephone Encounter (Signed)
Patient wants to Cancel Procedure scheduled 07/25/2020

## 2020-07-25 ENCOUNTER — Ambulatory Visit: Admission: RE | Admit: 2020-07-25 | Payer: Medicare PPO | Source: Home / Self Care | Admitting: Gastroenterology

## 2020-07-25 ENCOUNTER — Encounter: Admission: RE | Payer: Self-pay | Source: Home / Self Care

## 2020-07-25 SURGERY — COLONOSCOPY WITH PROPOFOL
Anesthesia: General

## 2020-08-08 ENCOUNTER — Other Ambulatory Visit: Payer: Self-pay | Admitting: Gastroenterology

## 2020-08-08 ENCOUNTER — Telehealth: Payer: Self-pay | Admitting: Gastroenterology

## 2020-08-08 DIAGNOSIS — Z85038 Personal history of other malignant neoplasm of large intestine: Secondary | ICD-10-CM

## 2020-08-08 NOTE — Telephone Encounter (Signed)
Colonoscopy scheduled for Aug 10 at [facility]

## 2020-08-08 NOTE — Telephone Encounter (Signed)
Patient is ready to schedule his procedure. Clinical staff will follow up with patient.

## 2020-08-14 ENCOUNTER — Encounter: Payer: Self-pay | Admitting: Gastroenterology

## 2020-08-15 ENCOUNTER — Ambulatory Visit: Payer: Medicare PPO | Admitting: Anesthesiology

## 2020-08-15 ENCOUNTER — Ambulatory Visit
Admission: RE | Admit: 2020-08-15 | Discharge: 2020-08-15 | Disposition: A | Discharge status: Home / Self Care | Payer: Medicare PPO | Attending: Gastroenterology | Admitting: Gastroenterology

## 2020-08-15 ENCOUNTER — Encounter
Admission: RE | Disposition: A | Discharge status: Home / Self Care | Payer: Self-pay | Source: Home / Self Care | Attending: Gastroenterology

## 2020-08-15 DIAGNOSIS — Z79899 Other long term (current) drug therapy: Secondary | ICD-10-CM | POA: Insufficient documentation

## 2020-08-15 DIAGNOSIS — K635 Polyp of colon: Secondary | ICD-10-CM

## 2020-08-15 DIAGNOSIS — Z980 Intestinal bypass and anastomosis status: Secondary | ICD-10-CM | POA: Diagnosis not present

## 2020-08-15 DIAGNOSIS — Z8521 Personal history of malignant neoplasm of larynx: Secondary | ICD-10-CM | POA: Diagnosis not present

## 2020-08-15 DIAGNOSIS — D125 Benign neoplasm of sigmoid colon: Secondary | ICD-10-CM | POA: Insufficient documentation

## 2020-08-15 DIAGNOSIS — D122 Benign neoplasm of ascending colon: Secondary | ICD-10-CM | POA: Insufficient documentation

## 2020-08-15 DIAGNOSIS — Z1211 Encounter for screening for malignant neoplasm of colon: Secondary | ICD-10-CM | POA: Diagnosis not present

## 2020-08-15 DIAGNOSIS — Z87891 Personal history of nicotine dependence: Secondary | ICD-10-CM | POA: Diagnosis not present

## 2020-08-15 DIAGNOSIS — Z85038 Personal history of other malignant neoplasm of large intestine: Secondary | ICD-10-CM | POA: Insufficient documentation

## 2020-08-15 DIAGNOSIS — D124 Benign neoplasm of descending colon: Secondary | ICD-10-CM | POA: Insufficient documentation

## 2020-08-15 HISTORY — PX: COLONOSCOPY WITH PROPOFOL: SHX5780

## 2020-08-15 SURGERY — COLONOSCOPY WITH PROPOFOL
Anesthesia: General

## 2020-08-15 MED ORDER — PROPOFOL 500 MG/50ML IV EMUL
INTRAVENOUS | Status: DC | PRN
  Administered 2020-08-15: 140 ug/kg/min via INTRAVENOUS

## 2020-08-15 MED ORDER — PHENYLEPHRINE HCL (PRESSORS) 10 MG/ML IV SOLN
INTRAVENOUS | Status: DC | PRN
  Administered 2020-08-15 (×4): 100 ug via INTRAVENOUS

## 2020-08-15 MED ORDER — PROPOFOL 10 MG/ML IV BOLUS
INTRAVENOUS | Status: DC | PRN
Start: 2020-08-15 — End: 2020-08-15
  Administered 2020-08-15: 70 mg via INTRAVENOUS

## 2020-08-15 MED ORDER — SODIUM CHLORIDE 0.9 % IV SOLN
INTRAVENOUS | Status: DC
  Administered 2020-08-15: 20 mL/h via INTRAVENOUS

## 2020-08-15 NOTE — H&P (Signed)
[name], MD [address], [place], [place], [place], [address] [address], [place], [place], [place], [address] Phone: [phone]  Fax: [phone]  Primary Care Physician:  Barbaraann Boys, MD   Pre-Procedure History & Physical: HPI:  Kirk Rodriguez is a 75 y.o. male is here for a colonoscopy.   Past Medical History:  Diagnosis Date   Allergy    Cancer ([place])    Larynx--carcinoma in situ   Colon cancer (HCC)    COPD (chronic obstructive pulmonary disease) (HCC)    no inhalers   GERD (gastroesophageal reflux disease)    no meds   Headache    h/o migraines   Heart murmur    as a child   Hyperlipidemia    Wears dentures    full upper, partial lower    Past Surgical History:  Procedure Laterality Date   COLONOSCOPY WITH PROPOFOL N/A 06/02/2019   Procedure: COLONOSCOPY WITH PROPOFOL;  Surgeon: Virgel Manifold, MD;  Location: [facility];  Service: Endoscopy;  Laterality: N/A;   CYSTOSCOPY N/A 06/29/2019   Procedure: CYSTOSCOPY;  Surgeon: Hollice Espy, MD;  Location: [facility];  Service: Urology;  Laterality: N/A;   DIRECT LARYNGOSCOPY Right 04/03/2016   Procedure: DIRECT MICROLARYNGOSCOPY WITH EXCISON RIGHT VOCAL CORD LESION;  Surgeon: Margaretha Sheffield, MD;  Location: [facility];  Service: ENT;  Laterality: Right;  RIGHT    INGUINAL HERNIA REPAIR Bilateral 12/18/2015   Procedure: LAPAROSCOPIC BILATERAL INGUINAL HERNIA REPAIR;  Surgeon: Hubbard Robinson, MD;  Location: [facility];  Service: General;  Laterality: Bilateral;   MINOR EXCISION OF ORAL LESION Left 03/06/2016   Procedure: direct microlaryngoscopy with excision left vocal cord lesion;  Surgeon: Margaretha Sheffield, MD;  Location: [facility];  Service: ENT;  Laterality: Left;  left vocal cord lesion   TRIGGER FINGER RELEASE Left 2016   VASECTOMY Bilateral 1978   XI ROBOTIC ASSISTED LOWER ANTERIOR RESECTION N/A 06/29/2019   Procedure: XI ROBOTIC ASSISTED LOWER ANTERIOR RESECTION;   Surgeon: Ronny Bacon, MD;  Location: [facility];  Service: General;  Laterality: N/A;    Prior to Admission medications   Medication Sig Start Date End Date Taking? Authorizing Provider  acetaminophen (TYLENOL) 500 MG tablet Take 500 mg by mouth every 6 (six) hours as needed (pain.).    Yes [provider]  ALEVE PM 220-25 MG TABS Take 1 tablet by mouth daily.   Yes [provider]  beta carotene w/minerals (OCUVITE) tablet Take 1 tablet by mouth at bedtime.   Yes [provider]  Cholecalciferol (VITAMIN D3) 2000 units capsule Take 2,000 Units by mouth daily.    Yes [provider]  Misc Natural Products (OSTEO BI-FLEX TRIPLE STRENGTH PO) Take 1 tablet by mouth in the morning and at bedtime.   Yes [provider]  Multiple Vitamin (MULTIVITAMIN WITH MINERALS) TABS tablet Take 1 tablet by mouth daily.   Yes [provider]  simvastatin (ZOCOR) 80 MG tablet Take 80 mg by mouth at bedtime.  11/25/15  Yes [provider]  Sodium Sulfate-Mag Sulfate-KCl (SUTAB) [phone] MG TABS At 5 PM take 12 tablets using the 8 oz cup provided in the kit drinking 5 cups of water and 5 hours before your procedure repeat the same process. 02/10/20  Yes Virgel Manifold, MD  triamcinolone (NASACORT) 55 MCG/ACT AERO nasal inhaler Place 1 spray into the nose daily.    Yes [provider]  albuterol (VENTOLIN HFA) 108 (90 Base) MCG/ACT inhaler Inhale  2 puffs into the lungs every 6 (six) hours as needed for wheezing or shortness of breath. Patient not taking: No sig reported 03/26/20   [provider]  benzonatate (TESSALON) 200 MG capsule Take 200 mg by mouth 3 (three) times daily as needed for cough. Patient not taking: No sig reported 03/26/20   [provider]  bismuth subsalicylate (PEPTO BISMOL) 262 MG chewable tablet Chew 524 mg by mouth daily. Taking 1/2 tablet every morning Patient not taking: Reported on 07/10/2020     [provider]  Liniments (BLUE-EMU SUPER STRENGTH EX) Apply 1 application topically 2 (two) times daily as needed (applied to right hand as needed for soreness/pain.). Patient not taking: Reported on 07/10/2020    [provider]  saw palmetto 160 MG capsule Take 160 mg by mouth 2 (two) times daily. Patient not taking: Reported on 07/10/2020    [provider]    Allergies as of 08/08/2020   (No Known Allergies)    Family History  Problem Relation Age of Onset   Lung cancer Father    Heart disease Father    Hypertension Father    Polycythemia Mother    Heart disease Mother     Social History   Socioeconomic History   Marital status: Married    Spouse name: Not on file   Number of children: Not on file   Years of education: Not on file   Highest education level: Not on file  Occupational History   Not on file  Tobacco Use   Smoking status: Former    Packs/day: 1.50    Years: 53.00    Pack years: 79.50    Types: Cigarettes    Quit date: 04/18/2015    Years since quitting: 5.3   Smokeless tobacco: Former  Scientific laboratory technician Use: Never used  Substance and Sexual Activity   Alcohol use: Not Currently    Comment: 6-12 beers in 1 year   Drug use: No   Sexual activity: Not Currently  Other Topics Concern   Not on file  Social History Narrative   Not on file   Social Determinants of Health   Financial Resource Strain: Not on file  Food Insecurity: Not on file  Transportation Needs: Not on file  Physical Activity: Not on file  Stress: Not on file  Social Connections: Not on file  Intimate Partner Violence: Not on file    Review of Systems: See HPI, otherwise negative ROS  Physical Exam: Constitutional: General:   Alert,  Well-developed, well-nourished, pleasant and cooperative in NAD BP 125/72   Pulse 77   Temp (!) 97.4 F (36.3 C) (Temporal)   Resp 20   Ht 6' 1" (1.854 m)   Wt 78.9 kg   SpO2 100%   BMI 22.96 kg/m   Head:  Normocephalic, atraumatic.   Eyes:  Sclera clear, no icterus.   Conjunctiva pink.   Mouth:  No deformity or lesions, oropharynx pink & moist.  Neck:  Supple, trachea midline  Respiratory: Normal respiratory effort  Gastrointestinal:  Soft, non-tender and non-distended without masses, hepatosplenomegaly or hernias noted.  No guarding or rebound tenderness.     Cardiac: No clubbing or edema.  No cyanosis. Normal posterior tibial pedal pulses noted.  Lymphatic:  No significant cervical adenopathy.  Psych:  Alert and cooperative. Normal mood and affect.  Musculoskeletal:   Symmetrical without gross deformities. 5/5 Lower extremity strength bilaterally.  Skin: Warm. Intact without significant lesions or rashes.  No jaundice.  Neurologic:  Face symmetrical, tongue midline, Normal sensation to touch;  grossly normal neurologically.  Psych:  Alert and oriented x3, Alert and cooperative. Normal mood and affect.  Impression/Plan: Kirk Rodriguez is here for a colonoscopy to be performed for history of colon cancer, s/p surgical resection  Risks, benefits, limitations, and alternatives regarding  colonoscopy have been reviewed with the patient.  Questions have been answered.  All parties agreeable.   Virgel Manifold, MD  08/15/2020, 11:18 AM

## 2020-08-15 NOTE — Anesthesia Postprocedure Evaluation (Signed)
Anesthesia Post Note  Patient: Kirk Rodriguez  Procedure(s) Performed: COLONOSCOPY WITH PROPOFOL  Patient location during evaluation: Endoscopy Anesthesia Type: General Level of consciousness: awake and alert and oriented Pain management: pain level controlled Vital Signs Assessment: post-procedure vital signs reviewed and stable Respiratory status: spontaneous breathing, nonlabored ventilation and respiratory function stable Cardiovascular status: blood pressure returned to baseline and stable Postop Assessment: no signs of nausea or vomiting Anesthetic complications: no   No notable events documented.   Last Vitals:  Vitals:   08/15/20 1210 08/15/20 1220  BP: (!) 85/56 110/70  Pulse: (!) 58 (!) 56  Resp: 14 11  Temp:    SpO2: 100% 96%    Last Pain:  Vitals:   08/15/20 1220  TempSrc:   PainSc: 0-No pain                 Anner Baity

## 2020-08-15 NOTE — Transfer of Care (Signed)
Immediate Anesthesia Transfer of Care Note  Patient: Kirk Rodriguez  Procedure(s) Performed: COLONOSCOPY WITH PROPOFOL  Patient Location: PACU  Anesthesia Type:General  Level of Consciousness: sedated  Airway & Oxygen Therapy: Patient Spontanous Breathing  Post-op Assessment: Report given to RN and Post -op Vital signs reviewed and stable  Post vital signs: Reviewed and stable  Last Vitals:  Vitals Value Taken Time  BP    Temp    Pulse    Resp    SpO2      Last Pain:  Vitals:   08/15/20 1005  TempSrc: Temporal  PainSc: 0-No pain         Complications: No notable events documented.

## 2020-08-15 NOTE — Anesthesia Preprocedure Evaluation (Addendum)
Anesthesia Evaluation  Patient identified by MRN, date of birth, ID band Patient awake    Reviewed: Allergy & Precautions, NPO status , Patient's Chart, lab work & pertinent test results  History of Anesthesia Complications Negative for: history of anesthetic complications  Airway Mallampati: II  TM Distance: >3 FB Neck ROM: Full    Dental  (+) Upper Dentures, Partial Lower   Pulmonary neg sleep apnea, COPD, former smoker,    breath sounds clear to auscultation- rhonchi (-) wheezing      Cardiovascular hypertension, (-) CAD, (-) Past MI, (-) Cardiac Stents and (-) CABG  Rhythm:Regular Rate:Normal - Systolic murmurs and - Diastolic murmurs    Neuro/Psych  Headaches, neg Seizures negative psych ROS   GI/Hepatic Neg liver ROS, GERD  ,  Endo/Other  negative endocrine ROSneg diabetes  Renal/GU negative Renal ROS     Musculoskeletal negative musculoskeletal ROS (+)   Abdominal (+) - obese,   Peds  Hematology negative hematology ROS (+)   Anesthesia Other Findings Past Medical History: No date: Allergy No date: Cancer [facility])     Comment:  Larynx--carcinoma in situ No date: Colon cancer ([place]) No date: COPD (chronic obstructive pulmonary disease) (HCC)     Comment:  no inhalers No date: GERD (gastroesophageal reflux disease)     Comment:  no meds No date: Headache     Comment:  h/o migraines No date: Heart murmur     Comment:  as a child No date: Hyperlipidemia No date: Wears dentures     Comment:  full upper, partial lower   Reproductive/Obstetrics                             Anesthesia Physical Anesthesia Plan  ASA: 2  Anesthesia Plan: General   Post-op Pain Management:    Induction: Intravenous  PONV Risk Score and Plan: 1 and Propofol infusion  Airway Management Planned: Natural Airway  Additional Equipment:   Intra-op Plan:   Post-operative Plan:   Informed Consent: I  have reviewed the patients History and Physical, chart, labs and discussed the procedure including the risks, benefits and alternatives for the proposed anesthesia with the patient or authorized representative who has indicated his/her understanding and acceptance.     Dental advisory given  Plan Discussed with: CRNA and Anesthesiologist  Anesthesia Plan Comments:        Anesthesia Quick Evaluation

## 2020-08-15 NOTE — Op Note (Signed)
[facility]astroenter[facility] Patient Name: Kirk Rodriguez Procedure Date: 08/15/2020 11:17 AM MRN: [id number] Account #: [id number] Date of Birth: 1945/02/01 Admit Type: Outpatient Age: 75 Room: [facility] ENDO ROOM 3 Gender: Male Note Status: Finalized Procedure:             Colonoscopy Indications:           High risk colon cancer surveillance: Personal history                         of colon cancer Providers:             Kimberley Dastrup B. Bonna Gains MD, MD Medicines:             Monitored Anesthesia Care Complications:         No immediate complications. Procedure:             Pre-Anesthesia Assessment:                        - ASA Grade Assessment: II - A patient with mild                         systemic disease.                        - Prior to the procedure, a History and Physical was                         performed, and patient medications, allergies and                         sensitivities were reviewed. The patient's tolerance                         of previous anesthesia was reviewed.                        - The risks and benefits of the procedure and the                         sedation options and risks were discussed with the                         patient. All questions were answered and informed                         consent was obtained.                        - Patient identification and proposed procedure were                         verified prior to the procedure by the physician, the                         nurse, the anesthesiologist, the anesthetist and the                         technician. The procedure was verified in the  procedure room.                        After obtaining informed consent, the colonoscope was                         passed under direct vision. Throughout the procedure,                         the patient's blood pressure, pulse, and oxygen                         saturations were monitored  continuously. The                         Colonoscope was introduced through the anus and                         advanced to the the cecum, identified by appendiceal                         orifice and ileocecal valve. The colonoscopy was                         performed with ease. The patient tolerated the                         procedure well. The quality of the bowel preparation                         was fair. Findings:      The perianal and digital rectal examinations were normal.      Three sessile polyps were found in the ascending colon. The polyps were       4 to 5 mm in size. These polyps were removed with a cold snare.       Resection and retrieval were complete.      A 5 mm polyp was found in the descending colon. The polyp was sessile.       The polyp was removed with a cold snare. Resection and retrieval were       complete.      Two sessile polyps were found in the sigmoid colon. The polyps were 4 to       5 mm in size. These polyps were removed with a cold snare. Resection and       retrieval were complete.      There was evidence of a prior surgical anastomosis in the recto-sigmoid       colon. This was patent and was characterized by healthy appearing mucosa.      The exam was otherwise without abnormality.      The rectum, sigmoid colon, descending colon, transverse colon, ascending       colon and cecum appeared normal.      Anal papilla(e) were hypertrophied.      No additional abnormalities were found on retroflexion. Impression:            - Preparation of the colon was fair.                        - Three 4 to 5 mm polyps  in the ascending colon,                         removed with a cold snare. Resected and retrieved.                        - One 5 mm polyp in the descending colon, removed with                         a cold snare. Resected and retrieved.                        - Two 4 to 5 mm polyps in the sigmoid colon, removed                          with a cold snare. Resected and retrieved.                        - Patent surgical anastomosis, characterized by                         healthy appearing mucosa.                        - The examination was otherwise normal.                        - The rectum, sigmoid colon, descending colon,                         transverse colon, ascending colon and cecum are normal.                        - Anal papilla(e) were hypertrophied. Recommendation:        - Discharge patient to home (with escort).                        - Advance diet as tolerated.                        - Continue present medications.                        - Await pathology results.                        - Repeat colonoscopy date to be determined after                         pending pathology results are reviewed.                        - The findings and recommendations were discussed with                         the patient.                        - The findings and recommendations were discussed with  the patient's family.                        - Return to primary care physician as previously                         scheduled. Procedure Code(s):     --- Professional ---                        [phone], Colonoscopy, flexible; with removal of                         tumor(s), polyp(s), or other lesion(s) by snare                         technique Diagnosis Code(s):     --- Professional ---                        K63.5, Polyp of colon                        Z85.038, Personal history of other malignant neoplasm                         of large intestine CPT copyright 2019 American Medical Association. All rights reserved. The codes documented in this report are preliminary and upon coder review may  be revised to meet current compliance requirements.  [name], MD Margretta Sidle B. Bonna Gains MD, MD 08/15/2020 12:09:38 PM This report has been signed electronically. Number of Addenda: 0 Note  Initiated On: 08/15/2020 11:17 AM Scope Withdrawal Time: 0 hours 23 minutes 38 seconds  Total Procedure Duration: 0 hours 28 minutes 2 seconds  Estimated Blood Loss:  Estimated blood loss: none.      [facility]

## 2020-08-16 ENCOUNTER — Encounter: Payer: Self-pay | Admitting: Gastroenterology

## 2020-08-16 LAB — SURGICAL PATHOLOGY

## 2020-08-17 ENCOUNTER — Encounter: Payer: Self-pay | Admitting: Gastroenterology

## 2020-08-27 ENCOUNTER — Encounter: Payer: Self-pay | Admitting: Gastroenterology

## 2020-08-28 NOTE — Telephone Encounter (Signed)
Error

## 2020-09-18 ENCOUNTER — Encounter: Payer: Medicare PPO | Attending: Pediatrics | Admitting: *Deleted

## 2020-09-18 ENCOUNTER — Other Ambulatory Visit: Payer: Self-pay

## 2020-09-18 ENCOUNTER — Encounter: Payer: Self-pay | Admitting: *Deleted

## 2020-09-18 VITALS — BP 120/70 | Ht 73.0 in | Wt 174.1 lb

## 2020-09-18 DIAGNOSIS — E119 Type 2 diabetes mellitus without complications: Secondary | ICD-10-CM | POA: Diagnosis not present

## 2020-09-18 NOTE — Patient Instructions (Addendum)
Check blood sugars before breakfast and 2 hrs after one meal - 3 x week Bring blood sugar records to the next appointment  Call your doctor for a prescription for:  1. Meter strips (type)  Accu-Chek Guide checking  3   times per week  2. Lancets (type)  Accu-Chek Softclix checking  3   times per week  Exercise: Continue walking/yard work   for   60  minutes   7  days a week  Eat 3 meals day,   1-2  snacks a day Space meals 4-6 hours apart Include 1 serving of protein with each meal Limit desserts/sweets Avoid sugar sweetened drinks (juices)  Call back if you want to schedule classes or an appointment with the nurse or dietitian

## 2020-09-18 NOTE — Progress Notes (Signed)
oneDiabetes Self-Management Education  Visit Type: First/Initial  Appt. Start Time: 0835 Appt. End Time: 1000  09/18/2020  Mr. Kirk Rodriguez, identified by name and date of birth, is a 75 y.o. male with a diagnosis of Diabetes: Type 2.   ASSESSMENT  Blood pressure 120/70, height _0  (1.854 m), weight 174 lb 1.6 oz (79 kg). Body mass index is 22.97 kg/m.   Diabetes Self-Management Education - 09/18/20 1137       Visit Information   Visit Type First/Initial      Initial Visit   Diabetes Type Type 2    Are you currently following a meal plan? No    Are you taking your medications as prescribed? Yes    Date Diagnosed August 2022      Health Coping   How would you rate your overall health? Good      Psychosocial Assessment   Patient Belief/Attitude about Diabetes Other (comment)   "I don't care" - when asked about feelings toward Diabetes   Self-care barriers None    Self-management support Doctor's office;Family    Patient Concerns Nutrition/Meal planning;Glycemic Control;Monitoring    Special Needs None    Preferred Learning Style Visual    Learning Readiness Ready    How often do you need to have someone help you when you read instructions, pamphlets, or other written materials from your doctor or pharmacy? 1 - Never    What is the last grade level you completed in school? 12th + community college      Pre-Education Assessment   Patient understands the diabetes disease and treatment process. Needs Instruction    Patient understands incorporating nutritional management into lifestyle. Needs Instruction    Patient undertands incorporating physical activity into lifestyle. Needs Review    Patient understands using medications safely. Needs Instruction    Patient understands monitoring blood glucose, interpreting and using results Needs Instruction    Patient understands prevention, detection, and treatment of acute complications. Needs Instruction    Patient understands  prevention, detection, and treatment of chronic complications. Needs Instruction    Patient understands how to develop strategies to address psychosocial issues. Needs Instruction    Patient understands how to develop strategies to promote health/change behavior. Needs Instruction      Complications   Last HgB A1C per patient/outside source 6.9 %   08/07/2020   How often do you check your blood sugar? 0 times/day (not testing)   Provided Accu-Chek Guide Me meter and instructed on use. BG upon return demonstration was 113 mg/dL at 9:45 am - 2 1/2 hrs pp.   Have you had a dilated eye exam in the past 12 months? Yes    Have you had a dental exam in the past 12 months? No   upper dentures, partial bottom   Are you checking your feet? No      Dietary Intake   Breakfast Jimmy Deans sausage, gravy bowl; toasted pimento or peanut butter sandwich; cereal and milk; waffles and syrup    Lunch microwave meal with meat, peas, carrots, potatoes; 2 hot dogs; bologna and cheese sandwich    Snack (afternoon) apple, cantaloupe, nabs    Dinner chicken and occasional fish but usually eats can of beans (pintos), black-eyed peas, mac-n-cheese, soup, rice, pasta with sauce; sometimes broccoli, green beans, squash, tomatoes    Snack (evening) chips    Beverage(s) water, coffee, diet soda, sometimes grape juice      Exercise   Exercise Type Light (walking /  raking leaves)    How many days per week to you exercise? 7    How many minutes per day do you exercise? 60    Total minutes per week of exercise 420      Patient Education   Previous Diabetes Education No    Disease state  Definition of diabetes, type 1 and 2, and the diagnosis of diabetes;Factors that contribute to the development of diabetes    Nutrition management  Role of diet in the treatment of diabetes and the relationship between the three main macronutrients and blood glucose level;Food label reading, portion sizes and measuring food.;Carbohydrate  counting;Reviewed blood glucose goals for pre and post meals and how to evaluate the patients' food intake on their blood glucose level.    Physical activity and exercise  Role of exercise on diabetes management, blood pressure control and cardiac health.    Monitoring Taught/evaluated SMBG meter.;Purpose and frequency of SMBG.;Taught/discussed recording of test results and interpretation of SMBG.;Identified appropriate SMBG and/or A1C goals.    Chronic complications Relationship between chronic complications and blood glucose control    Psychosocial adjustment Identified and addressed patients feelings and concerns about diabetes      Individualized Goals (developed by patient)   Reducing Risk Other (comment)   improve blood sugars, prevent diabetes complications     Outcomes   Expected Outcomes Demonstrated interest in learning. Expect positive outcomes        Individualized Plan for Diabetes Self-Management Training:   Learning Objective:  Patient will have a greater understanding of diabetes self-management. Patient education plan is to attend individual and/or group sessions per assessed needs and concerns.   Plan:   Patient Instructions  Check blood sugars before breakfast and 2 hrs after one meal - 3 x week Bring blood sugar records to the next appointment  Call your doctor for a prescription for:  1. Meter strips (type)  Accu-Chek Guide checking  3   times per week  2. Lancets (type)  Accu-Chek Softclix checking  3   times per week  Exercise: Continue walking/yard work   for   60  minutes   7  days a week  Eat 3 meals day,   1-2  snacks a day Space meals 4-6 hours apart Include 1 serving of protein with each meal Limit desserts/sweets Avoid sugar sweetened drinks (juices)  Call back if you want to schedule classes or an appointment with the nurse or dietitian  Expected Outcomes:  Demonstrated interest in learning. Expect positive outcomes  Education material provided:   General Meal Planning Guidelines Simple Meal Plan Meter = Accu-Chek Guide Me  If problems or questions, patient to contact team via:   Johny Drilling, RN, [place], [place] [phone]  Future DSME appointment:  Patient wants to try changes in eating habits and call back in 2 weeks to see if he wants to come to classes or have an appointment with the dietitian.

## 2020-09-25 ENCOUNTER — Telehealth: Payer: Self-pay | Admitting: *Deleted

## 2020-09-25 NOTE — Telephone Encounter (Signed)
Received call from patient. He had multiple questions about foods, labels, carbs, fat, sodium. He was seen last week and wanted to wait a few weeks to see if he wanted to come to Diabetes classes or see the dietitian. He wants to come back and see me for another appointment. Scheduled appointment for September 27.

## 2020-10-02 ENCOUNTER — Encounter: Payer: Self-pay | Admitting: *Deleted

## 2020-10-02 ENCOUNTER — Other Ambulatory Visit: Payer: Self-pay

## 2020-10-02 ENCOUNTER — Encounter: Payer: Medicare PPO | Admitting: *Deleted

## 2020-10-02 VITALS — BP 118/70 | Wt 176.3 lb

## 2020-10-02 DIAGNOSIS — E119 Type 2 diabetes mellitus without complications: Secondary | ICD-10-CM | POA: Diagnosis not present

## 2020-10-02 NOTE — Progress Notes (Signed)
Diabetes Self-Management Education  Visit Type: Follow-up  Appt. Start Time: 0835 Appt. End Time: 0930  10/02/2020  Mr. Kirk Rodriguez, identified by name and date of birth, is a 75 y.o. male with a diagnosis of Diabetes: Type 2.   ASSESSMENT  Blood pressure 118/70, weight 176 lb 4.8 oz (80 kg). Body mass index is 23.26 kg/m.   Diabetes Self-Management Education - 10/02/20 0949       Visit Information   Visit Type Follow-up      Initial Visit   Diabetes Type Type 2      Complications   How often do you check your blood sugar? 3-4 times / week    Fasting Blood glucose range (mg/dL) 70-129   FBG's less than 110 mg/dL.   Postprandial Blood glucose range (mg/dL) 130-179;70-129   pp's less than 140's mg/dL.   Have you had a dilated eye exam in the past 12 months? Yes    Have you had a dental exam in the past 12 months? No   upper plate, partial bottom   Are you checking your feet? No      Dietary Intake   Breakfast 3 meals and 2 snacks/day    Beverage(s) water, coffee, diet drinks      Exercise   Exercise Type Light (walking / raking leaves)    How many days per week to you exercise? 7    How many minutes per day do you exercise? 60    Total minutes per week of exercise 420      Patient Education   Disease state  Definition of diabetes, type 1 and 2, and the diagnosis of diabetes;Factors that contribute to the development of diabetes    Nutrition management  Role of diet in the treatment of diabetes and the relationship between the three main macronutrients and blood glucose level;Food label reading, portion sizes and measuring food.;Carbohydrate counting;Reviewed blood glucose goals for pre and post meals and how to evaluate the patients' food intake on their blood glucose level.    Physical activity and exercise  Role of exercise on diabetes management, blood pressure control and cardiac health.    Monitoring Purpose and frequency of SMBG.;Taught/discussed recording of test  results and interpretation of SMBG.;Identified appropriate SMBG and/or A1C goals.    Chronic complications Relationship between chronic complications and blood glucose control;Retinopathy and reason for yearly dilated eye exams    Psychosocial adjustment Identified and addressed patients feelings and concerns about diabetes      Individualized Goals (developed by patient)   Nutrition Follow meal plan discussed    Physical Activity Exercise 5-7 days per week;60 minutes per day    Monitoring  test my blood glucose as discussed    Reducing Risk examine blood glucose patterns;do foot checks daily      Post-Education Assessment   Patient understands the diabetes disease and treatment process. Demonstrates understanding / competency    Patient understands incorporating nutritional management into lifestyle. Demonstrates understanding / competency    Patient undertands incorporating physical activity into lifestyle. Demonstrates understanding / competency    Patient understands monitoring blood glucose, interpreting and using results Demonstrates understanding / competency    Patient understands prevention, detection, and treatment of chronic complications. Demonstrates understanding / competency    Patient understands how to develop strategies to address psychosocial issues. Demonstrates understanding / competency    Patient understands how to develop strategies to promote health/change behavior. Demonstrates understanding / competency      Outcomes  Expected Outcomes Demonstrated interest in learning. Expect positive outcomes    Program Status Completed      Subsequent Visit   Since your last visit have you continued or begun to take your medications as prescribed? Yes    Since your last visit have you had your blood pressure checked? No    Since your last visit have you experienced any weight changes? Gain    Weight Gain (lbs) 2.2    Since your last visit, are you checking your blood glucose  at least once a day? Yes   3 x week as directed            Individualized Plan for Diabetes Self-Management Training:   Learning Objective:  Patient will have a greater understanding of diabetes self-management. Patient education plan is to attend individual and/or group sessions per assessed needs and concerns.   Plan:   Patient Instructions  Check blood sugars before breakfast or 2 hrs after one meal 3 x week Bring blood sugar records to the next MD appointment  Continue walking/yard work for 60 minutes daily  Eat 3 meals day,   1-2  snacks a day Space meals 4-6 hours apart Continue to limit sweets/desserts  Call back for any questions  Expected Outcomes:  Demonstrated interest in learning. Expect positive outcomes  Education material provided:  Planning a Balanced Meal New and Old Food Label  Carbohydrate Label Fat Label Sodium Label Nutrition Prescription  If problems or questions, patient to contact team via:   Johny Drilling, RN, [place] [phone]  Future DSME appointment: PRN

## 2020-10-02 NOTE — Patient Instructions (Signed)
Check blood sugars before breakfast or 2 hrs after one meal 3 x week Bring blood sugar records to the next MD appointment  Continue walking/yard work for 60 minutes daily  Eat 3 meals day,   1-2  snacks a day Space meals 4-6 hours apart Continue to limit sweets/desserts  Call back for any questions

## 2020-10-09 ENCOUNTER — Telehealth: Payer: Self-pay | Admitting: *Deleted

## 2020-10-09 NOTE — Telephone Encounter (Signed)
Patient left message that he had questions about his blood sugar numbers. Called him and he reported that he is taking Prednisone prescribed by lung doctor and if this would make his blood sugars rise. Informed him that steroids will increase his numbers and will affect his next A1C readings. He reports reading of 150's after he ate and started Prednisone. Discussed goals for fasting and post meal readings and he is still within ranges. He will continue to monitor blood sugars and notify PCP at his next appointment.

## 2021-01-05 ENCOUNTER — Emergency Department
Admission: EM | Admit: 2021-01-05 | Discharge: 2021-01-05 | Disposition: A | Discharge status: Home / Self Care | Payer: Medicare PPO | Attending: Emergency Medicine | Admitting: Emergency Medicine

## 2021-01-05 ENCOUNTER — Encounter: Payer: Self-pay | Admitting: Emergency Medicine

## 2021-01-05 ENCOUNTER — Other Ambulatory Visit: Payer: Self-pay

## 2021-01-05 DIAGNOSIS — Z85038 Personal history of other malignant neoplasm of large intestine: Secondary | ICD-10-CM | POA: Diagnosis not present

## 2021-01-05 DIAGNOSIS — R112 Nausea with vomiting, unspecified: Secondary | ICD-10-CM | POA: Insufficient documentation

## 2021-01-05 DIAGNOSIS — Z87891 Personal history of nicotine dependence: Secondary | ICD-10-CM | POA: Diagnosis not present

## 2021-01-05 DIAGNOSIS — R197 Diarrhea, unspecified: Secondary | ICD-10-CM | POA: Insufficient documentation

## 2021-01-05 DIAGNOSIS — E119 Type 2 diabetes mellitus without complications: Secondary | ICD-10-CM | POA: Diagnosis not present

## 2021-01-05 DIAGNOSIS — R5383 Other fatigue: Secondary | ICD-10-CM | POA: Diagnosis not present

## 2021-01-05 DIAGNOSIS — J449 Chronic obstructive pulmonary disease, unspecified: Secondary | ICD-10-CM | POA: Diagnosis not present

## 2021-01-05 DIAGNOSIS — I10 Essential (primary) hypertension: Secondary | ICD-10-CM | POA: Diagnosis not present

## 2021-01-05 DIAGNOSIS — E860 Dehydration: Secondary | ICD-10-CM | POA: Diagnosis not present

## 2021-01-05 DIAGNOSIS — Z8521 Personal history of malignant neoplasm of larynx: Secondary | ICD-10-CM | POA: Insufficient documentation

## 2021-01-05 LAB — BASIC METABOLIC PANEL WITH GFR
Anion gap: 10 (ref 5–15)
BUN: 35 mg/dL — ABNORMAL HIGH (ref 8–23)
CO2: 26 mmol/L (ref 22–32)
Calcium: 9.7 mg/dL (ref 8.9–10.3)
Chloride: 103 mmol/L (ref 98–111)
Creatinine, Ser: 1.27 mg/dL — ABNORMAL HIGH (ref 0.61–1.24)
GFR, Estimated: 59 mL/min — ABNORMAL LOW
Glucose, Bld: 123 mg/dL — ABNORMAL HIGH (ref 70–99)
Potassium: 4.6 mmol/L (ref 3.5–5.1)
Sodium: 139 mmol/L (ref 135–145)

## 2021-01-05 LAB — URINALYSIS, ROUTINE W REFLEX MICROSCOPIC
Bacteria, UA: NONE SEEN
Bilirubin Urine: NEGATIVE
Glucose, UA: NEGATIVE mg/dL
Hgb urine dipstick: NEGATIVE
Ketones, ur: NEGATIVE mg/dL
Leukocytes,Ua: NEGATIVE
Nitrite: NEGATIVE
Protein, ur: 30 mg/dL — AB
Specific Gravity, Urine: 1.029 (ref 1.005–1.030)
Squamous Epithelial / HPF: NONE SEEN (ref 0–5)
pH: 5 (ref 5.0–8.0)

## 2021-01-05 LAB — CBC
HCT: 49.2 % (ref 39.0–52.0)
Hemoglobin: 16.2 g/dL (ref 13.0–17.0)
MCH: 30.3 pg (ref 26.0–34.0)
MCHC: 32.9 g/dL (ref 30.0–36.0)
MCV: 92.1 fL (ref 80.0–100.0)
Platelets: 317 K/uL (ref 150–400)
RBC: 5.34 MIL/uL (ref 4.22–5.81)
RDW: 13.9 % (ref 11.5–15.5)
WBC: 21 K/uL — ABNORMAL HIGH (ref 4.0–10.5)
nRBC: 0 % (ref 0.0–0.2)

## 2021-01-05 MED ORDER — ONDANSETRON 4 MG PO TBDP: 4.0000 mg | ORAL_TABLET | Freq: Once | ORAL | Status: AC

## 2021-01-05 MED ORDER — ONDANSETRON 4 MG PO TBDP
ORAL_TABLET | ORAL | Status: AC
  Administered 2021-01-05: 4 mg via ORAL
  Filled 2021-01-05: qty 1

## 2021-01-05 MED ORDER — SODIUM CHLORIDE 0.9 % IV BOLUS
1000.0000 mL | Freq: Once | INTRAVENOUS | Status: AC
  Administered 2021-01-05: 1000 mL via INTRAVENOUS

## 2021-01-05 MED ORDER — ONDANSETRON 4 MG PO TBDP: 4.0000 mg | ORAL_TABLET | Freq: Four times a day (QID) | ORAL | 0 refills | Status: DC | PRN

## 2021-01-05 MED ORDER — ONDANSETRON 4 MG PO TBDP
4.0000 mg | ORAL_TABLET | Freq: Once | ORAL | Status: AC
  Administered 2021-01-05: 4 mg via ORAL
  Filled 2021-01-05: qty 1

## 2021-01-05 NOTE — ED Provider Notes (Signed)
°  Emergency Medicine Provider Triage Evaluation Note  Kirk Rodriguez , a 75 y.o.male,  was evaluated in triage.  Pt complains of nausea, vomiting, diarrhea since last night.  Denies chest pain, shortness of breath, abdominal pain, back pain, or urinary symptoms.   Review of Systems  Positive: Nausea, vomiting, diarrhea Negative: Denies fever, chest pain, abdominal pain  Physical Exam   Vitals:   01/05/21 1455  BP: 106/79  Pulse: (!) 110  Resp: 20  Temp: 98.4 F (36.9 C)  SpO2: 98%   Gen:   Awake, no distress   Resp:  Normal effort  MSK:   Moves extremities without difficulty  Other:    Medical Decision Making  Given the patient's initial medical screening exam, the following diagnostic evaluation has been ordered. The patient will be placed in the appropriate treatment space, once one is available, to complete the evaluation and treatment. I have discussed the plan of care with the patient and I have advised the patient that an ED physician or mid-level practitioner will reevaluate their condition after the test results have been received, as the results may give them additional insight into the type of treatment they may need.    Diagnostics: Labs, EKG,  Treatments: Ondansetron.   Teodoro Spray, [place] 01/05/21 1507    Lavonia Drafts, MD 01/05/21 [phone]

## 2021-01-05 NOTE — ED Provider Notes (Signed)
[facility] Emergency Department Provider Note   ____________________________________________   Event Date/Time   First MD Initiated Contact with Patient 01/05/21 1730     (approximate)  I have reviewed the triage vital signs and the nursing notes.   HISTORY  Chief Complaint Emesis and Diarrhea    HPI Kirk Rodriguez is a 75 y.o. male yesterday suddenly noted in the evening that he felt nauseated and then had several episodes of vomiting and very loose watery diarrhea perhaps a dozen times.  Very little fatigued today and dehydrated.  He was able to drink this morning about 6 cups of water.  Continue to have some mild nausea but has had 0 abdominal pain or cramps.  Reports just a lot of diarrhea and vomiting.  No black or bloody emesis or diarrhea.  Felt like he had food poisoning type symptoms after eating macaroni  No chest pain no trouble breathing.  Reports that I may want to talk to him about his elevated blood count as he notes he has had a blood count of about 20 now for about a year.  He has a history of colon and neck cancer and follows with Dr. Janese Banks  He reiterates has not had any abdominal pain.  After getting Zofran here he feels a lot better and he is up on his feet walking around reports he feels like he is about ready to go home now and feels like his symptoms are gone  No fever  Past Medical History:  Diagnosis Date   Allergy    Cancer ([place])    Larynx--carcinoma in situ   Colon cancer ([place])    COPD (chronic obstructive pulmonary disease) ([place])    no inhalers   Diabetes mellitus without complication ([facility])    GERD (gastroesophageal reflux disease)    no meds   Headache    h/o migraines   Heart murmur    as a child   Hyperlipidemia    Wears dentures    full upper, partial lower    Patient Active Problem List   Diagnosis Date Noted   CAP (community acquired pneumonia) 04/01/2020   Sepsis ([place]) 04/01/2020   Liver lesion  04/01/2020   Urinary frequency 07/28/2019   Goals of care, counseling/discussion 07/21/2019   Adenocarcinoma of sigmoid colon ([place]) 06/29/2019   Positive FIT (fecal immunochemical test)    Polyp of colon    Colon neoplasm    Carcinoma in situ of larynx 11/18/2018   Former cigarette smoker 05/01/2016   COPD (chronic obstructive pulmonary disease) ([place]) 12/04/2015   History of chickenpox 12/04/2015   Hyperlipidemia, unspecified 12/04/2015   Hypertension 12/04/2015   Current smoker 09/30/2013   Trigger finger 06/09/2013    Past Surgical History:  Procedure Laterality Date   COLONOSCOPY WITH PROPOFOL N/A 06/02/2019   Procedure: COLONOSCOPY WITH PROPOFOL;  Surgeon: Virgel Manifold, MD;  Location: [facility] END[facility]COPY;  Service: Endoscopy;  Laterality: N/A;   COLONOSCOPY WITH PROPOFOL N/A 08/15/2020   Procedure: COLONOSCOPY WITH PROPOFOL;  Surgeon: Virgel Manifold, MD;  Location: [facility] END[facility]COPY;  Service: Endoscopy;  Laterality: N/A;   CYSTOSCOPY N/A 06/29/2019   Procedure: CYSTOSCOPY;  Surgeon: Hollice Espy, MD;  Location: [facility] ORS;  Service: Urology;  Laterality: N/A;   DIRECT LARYNGOSCOPY Right 04/03/2016   Procedure: DIRECT MICROLARYNGOSCOPY WITH EXCISON RIGHT VOCAL CORD LESION;  Surgeon: Margaretha Sheffield, MD;  Location: [place];  Service: ENT;  Laterality: Right;  RIGHT    INGUINAL HERNIA REPAIR  Bilateral 12/18/2015   Procedure: LAPAROSCOPIC BILATERAL INGUINAL HERNIA REPAIR;  Surgeon: Hubbard Robinson, MD;  Location: [facility] OR[facility];  Service: General;  Laterality: Bilateral;   MINOR EXCISION OF ORAL LESION Left 03/06/2016   Procedure: direct microlaryngoscopy with excision left vocal cord lesion;  Surgeon: Margaretha Sheffield, MD;  Location: [place];  Service: ENT;  Laterality: Left;  left vocal cord lesion   TRIGGER FINGER RELEASE Left 2016   VASECTOMY Bilateral 1978   XI ROBOTIC ASSISTED LOWER ANTERIOR RESECTION N/A 06/29/2019   Procedure: XI ROBOTIC ASSISTED LOWER  ANTERIOR RESECTION;  Surgeon: Ronny Bacon, MD;  Location: [facility];  Service: General;  Laterality: N/A;    Prior to Admission medications   Medication Sig Start Date End Date Taking? Authorizing Provider  beta carotene w/minerals (OCUVITE) tablet Take 1 tablet by mouth at bedtime.   Yes [provider]  Cholecalciferol (VITAMIN D3) 2000 units capsule Take 2,000 Units by mouth daily.    Yes [provider]  Ferrous Sulfate (IRON) 325 (65 Fe) MG TABS Take 1 tablet by mouth daily.   Yes [provider]  Misc Natural Products (OSTEO BI-FLEX TRIPLE STRENGTH PO) Take 1 tablet by mouth in the morning and at bedtime.   Yes [provider]  Multiple Vitamin (MULTIVITAMIN WITH MINERALS) TABS tablet Take 1 tablet by mouth daily.   Yes [provider]  ondansetron (ZOFRAN-ODT) 4 MG disintegrating tablet Take 1 tablet (4 mg total) by mouth every 6 (six) hours as needed for nausea or vomiting. 01/05/21  Yes Delman Kitten, MD  simvastatin (ZOCOR) 80 MG tablet Take 80 mg by mouth at bedtime.  11/25/15  Yes [provider]  triamcinolone (NASACORT) 55 MCG/ACT AERO nasal inhaler Place 1 spray into the nose daily.    Yes [provider]  acetaminophen (TYLENOL) 500 MG tablet Take 500 mg by mouth every 6 (six) hours as needed (pain.).  Patient not taking: No sig reported    [provider]  ALEVE PM 220-25 MG TABS Take 0.5 tablets by mouth at bedtime.    [provider]    Allergies Patient has no known allergies.  Family History  Problem Relation Age of Onset   Lung cancer Father    Heart disease Father    Hypertension Father    Polycythemia Mother    Heart disease Mother     Social History Social History   Tobacco Use   Smoking status: Former    Packs/day: 1.50    Years: 53.00    Pack years: 79.50    Types: Cigarettes    Quit date: 04/18/2015    Years since quitting: 5.7   Smokeless tobacco: Former  Brewing technologist Use: Never used  Substance Use Topics   Alcohol use: Not Currently    Comment: 6-12 beers in 1 year   Drug use: No    Review of Systems Constitutional: No fever/chills Eyes: No visual changes. ENT: No sore throat. Cardiovascular: Denies chest pain. Respiratory: Denies shortness of breath. Gastrointestinal: No abdominal pain.  Lots of nausea and vomiting and diarrhea that have now resolved Genitourinary: Negative for dysuria. Musculoskeletal: Negative for back pain. Skin: Negative for rash. Neurological: Negative for headaches, areas of focal weakness or numbness.    ____________________________________________   PHYSICAL EXAM:  VITAL SIGNS: ED Triage Vitals  Enc Vitals Group     BP 01/05/21 1455 106/79     Pulse Rate 01/05/21 1455 (!) 110  Resp 01/05/21 1455 20     Temp 01/05/21 1455 98.4 F (36.9 C)     Temp Source 01/05/21 1455 Oral     SpO2 01/05/21 1455 98 %     Weight 01/05/21 1456 175 lb (79.4 kg)     Height 01/05/21 1456 6\' 1"  (1.854 m)     Head Circumference --      Peak Flow --      Pain Score 01/05/21 1456 0     Pain Loc --      Pain Edu? --      Excl. in [place]? --     Constitutional: Alert and oriented. Well appearing and in no acute distress.  Ambulating in the room with me at the doorway without distress. Eyes: Conjunctivae are normal. Head: Atraumatic. Nose: No congestion/rhinnorhea. Mouth/Throat: Mucous membranes are moist. Neck: No stridor.  Cardiovascular: Normal rate, regular rhythm. Grossly normal heart sounds.  Good peripheral circulation. Respiratory: Normal respiratory effort.  No retractions. Lungs CTAB. Gastrointestinal: Soft and nontender. No distention.  No pain discomfort rebound guarding or distention in any quadrant.  Benign normal abdominal exam at this time. Musculoskeletal: No lower extremity tenderness nor edema. Neurologic:  Normal speech and language. No gross focal neurologic deficits are appreciated.  Skin:  Skin  is warm, dry and intact. No rash noted. Psychiatric: Mood and affect are normal. Speech and behavior are normal.  ____________________________________________   LABS (all labs ordered are listed, but only abnormal results are displayed)  Labs Reviewed  BASIC METABOLIC PANEL - Abnormal; Notable for the following components:      Result Value   Glucose, Bld 123 (*)    BUN 35 (*)    Creatinine, Ser 1.27 (*)    GFR, Estimated 59 (*)    All other components within normal limits  CBC - Abnormal; Notable for the following components:   WBC 21.0 (*)    All other components within normal limits  URINALYSIS, ROUTINE W REFLEX MICROSCOPIC - Abnormal; Notable for the following components:   Color, Urine YELLOW (*)    APPearance HAZY (*)    Protein, ur 30 (*)    All other components within normal limits  PATHOLOGIST SMEAR REVIEW  CBG MONITORING, ED   ____________________________________________  EKG   ____________________________________________  RADIOLOGY  No noted indication for imaging at this time.  No associated abdominal pain.  Does have elevated white count but this seems to be a somewhat persistent finding and I have sent for peripheral smear.  He denies fever.  His symptoms have now largely resolved.  I suspect this may be acute gastroenteritis given the somewhat rapid resolution of symptoms and is now asymptomatic status with associate abdominal pain. ____________________________________________   PROCEDURES  Procedure(s) performed: None  Procedures  Critical Care performed: No  ____________________________________________   INITIAL IMPRESSION / ASSESSMENT AND PLAN / ED COURSE  Pertinent labs & imaging results that were available during my care of the patient were reviewed by me and considered in my medical decision making (see chart for details).   Sudden onset nausea vomiting loose watery diarrhea.  Now resolved with nausea resolved after receiving Zofran.  He  has had no further vomiting or diarrhea in the ER.  Had a copious amounts yesterday evening  Nontoxic very reassuring abdominal exam.  Fully alert oriented and ambulatory at this time.  Appears to be self resolved are self-limited.  I do not find evidence of acute intra-abdominal catastrophe or evidence to support acute abdomen  or intra-abdominal infection at this time other than an elevated white count but this appears to be a historically elevated number for the last 9 months.  Have sent for pathologist smear review   ----------------------------------------- 8:39 PM on 01/05/2021 ----------------------------------------- Labs reviewed reassuring.  Does have leukocytosis, but again have noted this over time and have sent for smear review.  Patient asymptomatic feels well at this time.  Would like prescription for nausea medicine which is quite reasonable, and asks if he might have 1 extra nausea pill before he leaves as he will not be able to get pharmacy until tomorrow which I think is reasonable.  He has been able to eat drink and ambulate denies any ongoing pain or discomfort.  Appropriate for discharge.  Discussed with both him and family at the bedside careful return precautions which he is in agreement with  Return precautions and treatment recommendations and follow-up discussed with the patient who is agreeable with the plan.      ____________________________________________   FINAL CLINICAL IMPRESSION(S) / ED DIAGNOSES  Final diagnoses:  Nausea vomiting and diarrhea        Note:  This document was prepared using Dragon voice recognition software and may include unintentional dictation errors       Delman Kitten, MD 01/05/21 2040

## 2021-01-05 NOTE — ED Triage Notes (Signed)
Pt via POV from home. Pt c/o NVD since last night. Denies any pain. Pt endorses weakness. Pt is A&OX4 and NAD.

## 2021-01-05 NOTE — Discharge Instructions (Addendum)
? ?  Please return to the emergency room right away if you are to develop a fever, severe nausea, your pain becomes severe or worsens, you are unable to keep food down, begin vomiting any dark or bloody fluid, you develop any dark or bloody stools, feel dehydrated, or other new concerns or symptoms arise. ? ?

## 2021-01-05 NOTE — ED Notes (Signed)
Rn to bedside to introduce self to pt. Pt CAOx4 and in no distress.

## 2021-01-08 LAB — PATHOLOGIST SMEAR REVIEW

## 2021-01-23 ENCOUNTER — Other Ambulatory Visit: Payer: Self-pay | Admitting: Oncology

## 2021-01-23 ENCOUNTER — Other Ambulatory Visit: Payer: Self-pay

## 2021-01-23 ENCOUNTER — Ambulatory Visit
Admission: RE | Admit: 2021-01-23 | Discharge: 2021-01-23 | Disposition: A | Discharge status: Home / Self Care | Payer: Medicare PPO | Source: Ambulatory Visit | Attending: Oncology | Admitting: Oncology

## 2021-01-23 DIAGNOSIS — C187 Malignant neoplasm of sigmoid colon: Secondary | ICD-10-CM

## 2021-01-23 MED ORDER — IOHEXOL 300 MG/ML  SOLN: 100.0000 mL | Freq: Once | INTRAMUSCULAR | Status: DC | PRN

## 2021-01-23 MED ORDER — IOHEXOL 300 MG/ML  SOLN
75.0000 mL | Freq: Once | INTRAMUSCULAR | Status: AC | PRN
  Administered 2021-01-23: 75 mL via INTRAVENOUS

## 2021-01-25 ENCOUNTER — Other Ambulatory Visit: Payer: Self-pay

## 2021-01-25 ENCOUNTER — Encounter: Payer: Self-pay | Admitting: Oncology

## 2021-01-25 ENCOUNTER — Inpatient Hospital Stay: Payer: Medicare PPO | Attending: Oncology | Admitting: Oncology

## 2021-01-25 VITALS — BP 97/76 | HR 108 | Temp 98.0°F | Resp 16 | Ht 73.0 in | Wt 171.8 lb

## 2021-01-25 DIAGNOSIS — E119 Type 2 diabetes mellitus without complications: Secondary | ICD-10-CM | POA: Diagnosis not present

## 2021-01-25 DIAGNOSIS — Z85038 Personal history of other malignant neoplasm of large intestine: Secondary | ICD-10-CM | POA: Diagnosis present

## 2021-01-25 DIAGNOSIS — Z87891 Personal history of nicotine dependence: Secondary | ICD-10-CM | POA: Insufficient documentation

## 2021-01-25 DIAGNOSIS — Z08 Encounter for follow-up examination after completed treatment for malignant neoplasm: Secondary | ICD-10-CM

## 2021-01-25 DIAGNOSIS — Z801 Family history of malignant neoplasm of trachea, bronchus and lung: Secondary | ICD-10-CM | POA: Insufficient documentation

## 2021-01-25 DIAGNOSIS — R918 Other nonspecific abnormal finding of lung field: Secondary | ICD-10-CM

## 2021-01-25 DIAGNOSIS — R911 Solitary pulmonary nodule: Secondary | ICD-10-CM | POA: Diagnosis not present

## 2021-01-25 NOTE — Progress Notes (Signed)
Pt had gi bug and his wife had it and other people in neighborhood. He has bouts of diarrhea some every month and uses imodium

## 2021-01-25 NOTE — Progress Notes (Signed)
Hematology/Oncology Consult note [facility]  Telephone:([phone] Fax:([phone]  Patient Care Team: Barbaraann Boys, MD as PCP - General (Family Medicine) Clent Jacks, RN as Oncology Nurse Navigator Sindy Guadeloupe, MD as Consulting Physician (Oncology) Ronny Bacon, MD as Consulting Physician (General Surgery)   Name of the patient: Kirk Rodriguez  [id number]  1945-07-09   Date of visit: 01/25/21  Diagnosis- stage 1 colon cancer Lung nodules  Chief complaint/ Reason for visit- discuss ct chest and further management  Heme/Onc history: Patient is a 76 year old male diagnosed with stage I colon cancer s/p low anterior resection with Dr. Christian Mate in June 2021.Final pathology showed invasive colorectal adenocarcinoma moderately differentiated with negative margins.  2.3 x 1.7 x 0.7 cm.  No lymphovascular invasion.  16 lymph nodes negative for malignancy.  PT2PN0.  He does not require any surveillance scans without labs for this.   Patient underwent low-dose lung cancer screening in June 2022 which showed a 5.9 mm left lower lobe lung nodule.  Prevascular nodes up to 1 cm.  Prior to that he had a CTA in March 2022 which showed a questionable low-attenuation focus in the left lobe of the liver.  This was followed by an MRI abdomen with and without contrast in June 2022 which showed a simple cyst near the junction of segment 2 and 4 of the liver but otherwise unremarkable  Interval history-he recently got over a stomach flu and is gradually getting better.  Also had some diarrhea after he took barium recently for CT  ECOG PS- 1 Pain scale- 0   Review of systems- Review of Systems  Constitutional:  Negative for chills, fever, malaise/fatigue and weight loss.  HENT:  Negative for congestion, ear discharge and nosebleeds.   Eyes:  Negative for blurred vision.  Respiratory:  Negative for cough, hemoptysis, sputum production, shortness of breath  and wheezing.   Cardiovascular:  Negative for chest pain, palpitations, orthopnea and claudication.  Gastrointestinal:  Negative for abdominal pain, blood in stool, constipation, diarrhea, heartburn, melena, nausea and vomiting.  Genitourinary:  Negative for dysuria, flank pain, frequency, hematuria and urgency.  Musculoskeletal:  Negative for back pain, joint pain and myalgias.  Skin:  Negative for rash.  Neurological:  Negative for dizziness, tingling, focal weakness, seizures, weakness and headaches.  Endo/Heme/Allergies:  Does not bruise/bleed easily.  Psychiatric/Behavioral:  Negative for depression and suicidal ideas. The patient does not have insomnia.      No Known Allergies   Past Medical History:  Diagnosis Date   Allergy    Cancer ([place])    Larynx--carcinoma in situ   Colon cancer (HCC)    COPD (chronic obstructive pulmonary disease) (HCC)    no inhalers   Diabetes mellitus without complication (HCC)    GERD (gastroesophageal reflux disease)    no meds   Headache    h/o migraines   Heart murmur    as a child   Hyperlipidemia    Wears dentures    full upper, partial lower     Past Surgical History:  Procedure Laterality Date   COLONOSCOPY WITH PROPOFOL N/A 06/02/2019   Procedure: COLONOSCOPY WITH PROPOFOL;  Surgeon: Virgel Manifold, MD;  Location: [facility];  Service: Endoscopy;  Laterality: N/A;   COLONOSCOPY WITH PROPOFOL N/A 08/15/2020   Procedure: COLONOSCOPY WITH PROPOFOL;  Surgeon: Virgel Manifold, MD;  Location: [facility];  Service: Endoscopy;  Laterality: N/A;   CYSTOSCOPY N/A 06/29/2019   Procedure: CYSTOSCOPY;  Surgeon: Hollice Espy, MD;  Location: [facility];  Service: Urology;  Laterality: N/A;   DIRECT LARYNGOSCOPY Right 04/03/2016   Procedure: DIRECT MICROLARYNGOSCOPY WITH EXCISON RIGHT VOCAL CORD LESION;  Surgeon: Margaretha Sheffield, MD;  Location: [place];  Service: ENT;  Laterality: Right;  RIGHT    INGUINAL HERNIA REPAIR  Bilateral 12/18/2015   Procedure: LAPAROSCOPIC BILATERAL INGUINAL HERNIA REPAIR;  Surgeon: Hubbard Robinson, MD;  Location: [facility];  Service: General;  Laterality: Bilateral;   MINOR EXCISION OF ORAL LESION Left 03/06/2016   Procedure: direct microlaryngoscopy with excision left vocal cord lesion;  Surgeon: Margaretha Sheffield, MD;  Location: [place];  Service: ENT;  Laterality: Left;  left vocal cord lesion   TRIGGER FINGER RELEASE Left 2016   VASECTOMY Bilateral 1978   XI ROBOTIC ASSISTED LOWER ANTERIOR RESECTION N/A 06/29/2019   Procedure: XI ROBOTIC ASSISTED LOWER ANTERIOR RESECTION;  Surgeon: Ronny Bacon, MD;  Location: [facility];  Service: General;  Laterality: N/A;    Social History   Socioeconomic History   Marital status: Married    Spouse name: Not on file   Number of children: Not on file   Years of education: Not on file   Highest education level: Not on file  Occupational History   Not on file  Tobacco Use   Smoking status: Former    Packs/day: 1.50    Years: 53.00    Pack years: 79.50    Types: Cigarettes    Quit date: 04/18/2015    Years since quitting: 5.7   Smokeless tobacco: Former  Scientific laboratory technician Use: Never used  Substance and Sexual Activity   Alcohol use: Not Currently    Comment: 6-12 beers in 1 year   Drug use: No   Sexual activity: Not Currently  Other Topics Concern   Not on file  Social History Narrative   Not on file   Social Determinants of Health   Financial Resource Strain: Not on file  Food Insecurity: Not on file  Transportation Needs: Not on file  Physical Activity: Not on file  Stress: Not on file  Social Connections: Not on file  Intimate Partner Violence: Not on file    Family History  Problem Relation Age of Onset   Lung cancer Father    Heart disease Father    Hypertension Father    Polycythemia Mother    Heart disease Mother      Current Outpatient Medications:    acetaminophen (TYLENOL) 500 MG tablet,  Take 500 mg by mouth every 6 (six) hours as needed (pain.).  (Patient not taking: No sig reported), Disp: , Rfl:    ALEVE PM 220-25 MG TABS, Take 0.5 tablets by mouth at bedtime., Disp: , Rfl:    beta carotene w/minerals (OCUVITE) tablet, Take 1 tablet by mouth at bedtime., Disp: , Rfl:    Cholecalciferol (VITAMIN D3) 2000 units capsule, Take 2,000 Units by mouth daily. , Disp: , Rfl:    Ferrous Sulfate (IRON) 325 (65 Fe) MG TABS, Take 1 tablet by mouth daily., Disp: , Rfl:    Misc Natural Products (OSTEO BI-FLEX TRIPLE STRENGTH PO), Take 1 tablet by mouth in the morning and at bedtime., Disp: , Rfl:    Multiple Vitamin (MULTIVITAMIN WITH MINERALS) TABS tablet, Take 1 tablet by mouth daily., Disp: , Rfl:    ondansetron (ZOFRAN-ODT) 4 MG disintegrating tablet, Take 1 tablet (4 mg total) by mouth every 6 (six) hours as needed for nausea or  vomiting., Disp: 20 tablet, Rfl: 0   simvastatin (ZOCOR) 80 MG tablet, Take 80 mg by mouth at bedtime. , Disp: , Rfl:    triamcinolone (NASACORT) 55 MCG/ACT AERO nasal inhaler, Place 1 spray into the nose daily. , Disp: , Rfl:   Physical exam:  Vitals:   01/25/21 1308  BP: 97/76  Pulse: (!) 108  Resp: 16  Temp: 98 F (36.7 C)  Weight: 171 lb 12.8 oz (77.9 kg)  Height: 6\' 1"  (1.854 m)   Physical Exam Cardiovascular:     Rate and Rhythm: Regular rhythm. Tachycardia present.     Heart sounds: Normal heart sounds.  Pulmonary:     Effort: Pulmonary effort is normal.     Breath sounds: Normal breath sounds.  Abdominal:     General: Bowel sounds are normal.     Palpations: Abdomen is soft.  Skin:    General: Skin is warm and dry.  Neurological:     Mental Status: He is alert and oriented to person, place, and time.     CMP Latest Ref Rng & Units 01/05/2021  Glucose 70 - 99 mg/dL 123(H)  BUN 8 - 23 mg/dL 35(H)  Creatinine 0.61 - 1.24 mg/dL 1.27(H)  Sodium 135 - 145 mmol/L 139  Potassium 3.5 - 5.1 mmol/L 4.6  Chloride 98 - 111 mmol/L 103  CO2 22 -  32 mmol/L 26  Calcium 8.9 - 10.3 mg/dL 9.7  Total Protein 6.5 - 8.1 g/dL -  Total Bilirubin 0.3 - 1.2 mg/dL -  Alkaline Phos 38 - 126 U/L -  AST 15 - 41 U/L -  ALT 0 - 44 U/L -   CBC Latest Ref Rng & Units 01/05/2021  WBC 4.0 - 10.5 K/uL 21.0(H)  Hemoglobin 13.0 - 17.0 g/dL 16.2  Hematocrit 39.0 - 52.0 % 49.2  Platelets 150 - 400 K/uL 317      CT CHEST W CONTRAST  Result Date: 01/23/2021 CLINICAL DATA:  Colorectal cancer staging. Intermittent shortness of breath on exertion. History of pneumonia 10 months ago. History of colon cancer with partial colon resection in 5/21. History of laryngeal carcinoma with radiation therapy in 2017. EXAM: CT CHEST WITH CONTRAST TECHNIQUE: Multidetector CT imaging of the chest was performed during intravenous contrast administration. RADIATION DOSE REDUCTION: This exam was performed according to the departmental dose-optimization program which includes automated exposure control, adjustment of the mA and/or kV according to patient size and/or use of iterative reconstruction technique. CONTRAST:  73mL OMNIPAQUE IOHEXOL 300 MG/ML  SOLN COMPARISON:  Chest CT 06/26/2020 and 04/01/2020. FINDINGS: Cardiovascular: No acute vascular findings are seen. There is atherosclerosis of the aorta, great vessels and coronary arteries. The heart size is normal. There is no pericardial effusion. Mediastinum/Nodes: There are no enlarged mediastinal, hilar or axillary lymph nodes.Stable small mediastinal and hilar lymph nodes. The thyroid gland and trachea demonstrate no significant findings. There is stable mild dilatation of the distal esophagus with associated mild wall thickening. Lungs/Pleura: No pleural effusion or pneumothorax. At least moderate centrilobular and paraseptal emphysema is again noted. There is chronic volume loss in both upper lobes, especially on the left with there is bronchiectasis and peribronchial consolidation, similar to the most recent study. In the right  upper lobe, there are new subpleural apical opacities which are likely inflammatory. There are chronic bullous changes at both lung apices with calcifications on the right. No suspicious pulmonary nodules. The small left lower lobe nodule seen on the most recent study has resolved. Upper abdomen:  The visualized upper abdomen appears stable without significant findings. Tiny cysts in the dome of the liver is unchanged, previously evaluated by MRI. Musculoskeletal/Chest wall: There is no chest wall mass or suspicious osseous finding. Stable mild multilevel spondylosis. IMPRESSION: 1. The new 6 mm nodule in the left lower lobe on most recent CT of 7 months ago has resolved, consistent with a benign finding. 2. No suspicious pulmonary nodules are identified. 3. Chronic sequela of left upper lobe pneumonia with volume loss, bronchiectasis and consolidation, similar to most recent study. There are new milder contralateral findings at the right lung apex which are likely inflammatory/infectious as well. Findings are unlikely to be related to the patient's remote radiation therapy for head and neck cancer. 4. Aortic Atherosclerosis (ICD10-I70.0) and Emphysema (ICD10-J43.9). Coronary artery atherosclerosis. Electronically Signed   By: Richardean Sale M.D.   On: 01/23/2021 16:03     Assessment and plan- Patient is a 76 y.o. male who is here for follow-up of following issues:  Stage I colon cancer: Last colonoscopy was done in August 2022 by Dr. Bonna Gains which did not show any evidence of malignancy.  He would be due for repeat colonoscopy sometime in 2025.  He had 3 polyps that were resected and were negative for malignancy.  He does not require any surveillance scans for stage I colon cancer. Lung nodule: Patient was noted to have a 6 mmLeft lower lobe lung nodule and a repeat CT was recommended in 6 months.  His present CT scan from 01/23/2021 shows resolution of the prior noted lung nodule.  No new suspicious  pulmonary nodules noted.  Chronic sequelae of bronchiectasis and consolidation overall similar.  He does not require any surveillance CTs chest in the future  I will see him back in 1 year with CBC with differential CMP and CEA   Visit Diagnosis 1. Lung nodules   2. Encounter for follow-up surveillance of colon cancer      Dr. Randa Evens, MD, MPH [facility] at [facility] [phone] 01/25/2021 1:33 PM

## 2022-01-24 ENCOUNTER — Telehealth: Payer: Self-pay | Admitting: *Deleted

## 2022-01-24 NOTE — Telephone Encounter (Signed)
Patient called stating hat he has an appointment Mondfay with Dr Janese Banks and that he just had labs drawn at PCP office last  week CBC, CMP, CEA. He has same labs ordered here, can they be cancelled? If so please call pt to let him know

## 2022-01-24 NOTE — Telephone Encounter (Signed)
Please cancel labs and let him know

## 2022-01-27 ENCOUNTER — Inpatient Hospital Stay: Payer: Medicare PPO

## 2022-01-27 ENCOUNTER — Inpatient Hospital Stay: Payer: Medicare PPO | Attending: Oncology | Admitting: Oncology

## 2022-01-27 ENCOUNTER — Encounter: Payer: Self-pay | Admitting: Oncology

## 2022-01-27 VITALS — BP 108/71 | HR 92 | Resp 18 | Wt 171.0 lb

## 2022-01-27 DIAGNOSIS — Z8249 Family history of ischemic heart disease and other diseases of the circulatory system: Secondary | ICD-10-CM | POA: Diagnosis not present

## 2022-01-27 DIAGNOSIS — Z87891 Personal history of nicotine dependence: Secondary | ICD-10-CM | POA: Diagnosis not present

## 2022-01-27 DIAGNOSIS — C187 Malignant neoplasm of sigmoid colon: Secondary | ICD-10-CM | POA: Insufficient documentation

## 2022-01-27 DIAGNOSIS — Z832 Family history of diseases of the blood and blood-forming organs and certain disorders involving the immune mechanism: Secondary | ICD-10-CM | POA: Insufficient documentation

## 2022-01-27 DIAGNOSIS — Z801 Family history of malignant neoplasm of trachea, bronchus and lung: Secondary | ICD-10-CM | POA: Insufficient documentation

## 2022-01-27 DIAGNOSIS — Z08 Encounter for follow-up examination after completed treatment for malignant neoplasm: Secondary | ICD-10-CM

## 2022-01-27 DIAGNOSIS — Z85038 Personal history of other malignant neoplasm of large intestine: Secondary | ICD-10-CM

## 2022-01-27 DIAGNOSIS — Z79899 Other long term (current) drug therapy: Secondary | ICD-10-CM | POA: Insufficient documentation

## 2022-01-27 NOTE — Progress Notes (Signed)
No new concerns.  

## 2022-01-27 NOTE — Progress Notes (Signed)
Hematology/Oncology Consult note [facility]  Telephone:([phone] Fax:([phone]  Patient Care Team: Barbaraann Boys, MD as PCP - General (Family Medicine) Clent Jacks, RN as Oncology Nurse Navigator Sindy Guadeloupe, MD as Consulting Physician (Oncology) Ronny Bacon, MD as Consulting Physician (General Surgery)   Name of the patient: Kirk Rodriguez  [id number]  08/03/1945   Date of visit: 01/27/22  Diagnosis-stage I colon cancer  Chief complaint/ Reason for visit-routine follow-up of colon cancer  Heme/Onc history: Kirk Rodriguez is a 77 year old male diagnosed with stage I colon cancer s/p low anterior resection with Dr. Christian Mate in June 2021.Final pathology showed invasive colorectal adenocarcinoma moderately differentiated with negative margins.  2.3 x 1.7 x 0.7 cm.  No lymphovascular invasion.  16 lymph nodes negative for malignancy.  PT2PN0.  He does not require any surveillance scans without labs for this.   Patient underwent low-dose lung cancer screening in June 2022 which showed a 5.9 mm left lower lobe lung nodule.  Prevascular nodes up to 1 cm.  Prior to that he had a CTA in March 2022 which showed a questionable low-attenuation focus in the left lobe of the liver.  This was followed by an MRI abdomen with and without contrast in June 2022 which showed a simple cyst near the junction of segment 2 and 4 of the liver but otherwise unremarkable  Interval history-patient is doing well overall and denies any complaints at this time.  Appetite and weight have remained stable.  Bowel movements are regular.  ECOG PS- 1 Pain scale- 0   Review of systems- Review of Systems  Constitutional:  Negative for chills, fever, malaise/fatigue and weight loss.  HENT:  Negative for congestion, ear discharge and nosebleeds.   Eyes:  Negative for blurred vision.  Respiratory:  Negative for cough, hemoptysis, sputum production, shortness of breath and  wheezing.   Cardiovascular:  Negative for chest pain, palpitations, orthopnea and claudication.  Gastrointestinal:  Negative for abdominal pain, blood in stool, constipation, diarrhea, heartburn, melena, nausea and vomiting.  Genitourinary:  Negative for dysuria, flank pain, frequency, hematuria and urgency.  Musculoskeletal:  Negative for back pain, joint pain and myalgias.  Skin:  Negative for rash.  Neurological:  Negative for dizziness, tingling, focal weakness, seizures, weakness and headaches.  Endo/Heme/Allergies:  Does not bruise/bleed easily.  Psychiatric/Behavioral:  Negative for depression and suicidal ideas. The patient does not have insomnia.       No Known Allergies   Past Medical History:  Diagnosis Date   Allergy    Cancer ([place])    Larynx--carcinoma in situ   Colon cancer (HCC)    COPD (chronic obstructive pulmonary disease) (HCC)    no inhalers   Diabetes mellitus without complication (HCC)    GERD (gastroesophageal reflux disease)    no meds   Headache    h/o migraines   Heart murmur    as a child   Hyperlipidemia    Wears dentures    full upper, partial lower     Past Surgical History:  Procedure Laterality Date   COLONOSCOPY WITH PROPOFOL N/A 06/02/2019   Procedure: COLONOSCOPY WITH PROPOFOL;  Surgeon: Virgel Manifold, MD;  Location: [facility];  Service: Endoscopy;  Laterality: N/A;   COLONOSCOPY WITH PROPOFOL N/A 08/15/2020   Procedure: COLONOSCOPY WITH PROPOFOL;  Surgeon: Virgel Manifold, MD;  Location: [facility];  Service: Endoscopy;  Laterality: N/A;   CYSTOSCOPY N/A 06/29/2019   Procedure: CYSTOSCOPY;  Surgeon: Hollice Espy, MD;  Location: [facility];  Service: Urology;  Laterality: N/A;   DIRECT LARYNGOSCOPY Right 04/03/2016   Procedure: DIRECT MICROLARYNGOSCOPY WITH EXCISON RIGHT VOCAL CORD LESION;  Surgeon: Margaretha Sheffield, MD;  Location: [place];  Service: ENT;  Laterality: Right;  RIGHT    INGUINAL HERNIA REPAIR  Bilateral 12/18/2015   Procedure: LAPAROSCOPIC BILATERAL INGUINAL HERNIA REPAIR;  Surgeon: Hubbard Robinson, MD;  Location: [facility];  Service: General;  Laterality: Bilateral;   MINOR EXCISION OF ORAL LESION Left 03/06/2016   Procedure: direct microlaryngoscopy with excision left vocal cord lesion;  Surgeon: Margaretha Sheffield, MD;  Location: [place];  Service: ENT;  Laterality: Left;  left vocal cord lesion   TRIGGER FINGER RELEASE Left 2016   VASECTOMY Bilateral 1978   XI ROBOTIC ASSISTED LOWER ANTERIOR RESECTION N/A 06/29/2019   Procedure: XI ROBOTIC ASSISTED LOWER ANTERIOR RESECTION;  Surgeon: Ronny Bacon, MD;  Location: [facility];  Service: General;  Laterality: N/A;    Social History   Socioeconomic History   Marital status: Married    Spouse name: Not on file   Number of children: Not on file   Years of education: Not on file   Highest education level: Not on file  Occupational History   Not on file  Tobacco Use   Smoking status: Former    Packs/day: 1.50    Years: 53.00    Total pack years: 79.50    Types: Cigarettes    Quit date: 04/18/2015    Years since quitting: 6.7   Smokeless tobacco: Former  Scientific laboratory technician Use: Never used  Substance and Sexual Activity   Alcohol use: Yes    Comment: 6-12 beers in 1 year   Drug use: No   Sexual activity: Not Currently  Other Topics Concern   Not on file  Social History Narrative   Not on file   Social Determinants of Health   Financial Resource Strain: Not on file  Food Insecurity: Not on file  Transportation Needs: Not on file  Physical Activity: Not on file  Stress: Not on file  Social Connections: Not on file  Intimate Partner Violence: Not on file    Family History  Problem Relation Age of Onset   Lung cancer Father    Heart disease Father    Hypertension Father    Polycythemia Mother    Heart disease Mother      Current Outpatient Medications:    acetaminophen (TYLENOL) 500 MG tablet,  Take 500 mg by mouth every 6 (six) hours as needed (pain.)., Disp: , Rfl:    ALEVE PM 220-25 MG TABS, Take 0.5 tablets by mouth at bedtime., Disp: , Rfl:    beta carotene w/minerals (OCUVITE) tablet, Take 1 tablet by mouth at bedtime., Disp: , Rfl:    Cholecalciferol (VITAMIN D3) 2000 units capsule, Take 2,000 Units by mouth daily. , Disp: , Rfl:    Ferrous Sulfate (IRON) 325 (65 Fe) MG TABS, Take 1 tablet by mouth daily., Disp: , Rfl:    loperamide (IMODIUM) 2 MG capsule, Take 2 mg by mouth as needed for diarrhea or loose stools., Disp: , Rfl:    Misc Natural Products (OSTEO BI-FLEX TRIPLE STRENGTH PO), Take 1 tablet by mouth in the morning and at bedtime., Disp: , Rfl:    Multiple Vitamin (MULTIVITAMIN WITH MINERALS) TABS tablet, Take 1 tablet by mouth daily., Disp: , Rfl:    simvastatin (ZOCOR) 80 MG tablet, Take 80 mg by mouth at bedtime. ,  Disp: , Rfl:    triamcinolone (NASACORT) 55 MCG/ACT AERO nasal inhaler, Place 1 spray into the nose daily. , Disp: , Rfl:    Turmeric (QC TUMERIC COMPLEX PO), Take by mouth., Disp: , Rfl:   Physical exam:  Vitals:   01/27/22 1423  BP: 108/71  Pulse: 92  Resp: 18  SpO2: 99%  Weight: 171 lb (77.6 kg)   Physical Exam Cardiovascular:     Rate and Rhythm: Normal rate and regular rhythm.     Heart sounds: Normal heart sounds.  Pulmonary:     Effort: Pulmonary effort is normal.     Breath sounds: Normal breath sounds.  Abdominal:     General: Bowel sounds are normal.     Palpations: Abdomen is soft.  Skin:    General: Skin is warm and dry.  Neurological:     Mental Status: He is alert and oriented to person, place, and time.         Latest Ref Rng & Units 01/05/2021    2:58 PM  CMP  Glucose 70 - 99 mg/dL 123   BUN 8 - 23 mg/dL 35   Creatinine 0.61 - 1.24 mg/dL 1.27   Sodium 135 - 145 mmol/L 139   Potassium 3.5 - 5.1 mmol/L 4.6   Chloride 98 - 111 mmol/L 103   CO2 22 - 32 mmol/L 26   Calcium 8.9 - 10.3 mg/dL 9.7       Latest Ref Rng  & Units 01/05/2021    2:58 PM  CBC  WBC 4.0 - 10.5 K/uL 21.0   Hemoglobin 13.0 - 17.0 g/dL 16.2   Hematocrit 39.0 - 52.0 % 49.2   Platelets 150 - 400 K/uL 317      Assessment and plan- Patient is a 77 y.o. male with history of stage I colon cancer in June 2021 s/p surgery.  This is a routine follow-up visit  Clinically patient is doing well with no concerning signs and symptoms of recurrence based on today's exam.  He would be due for a repeat surveillance colonoscopy next year.  No indication for surveillance imaging for stage I colon cancer.  CEA is within normal limits.  I will see him back in 1 year with CBC with differential CMP and CEA   Visit Diagnosis 1. Encounter for follow-up surveillance of colon cancer      Dr. Randa Evens, MD, MPH [facility] [phone] 01/27/2022 4:13 PM

## 2022-03-18 IMAGING — CT CT CHEST LUNG CANCER SCREENING LOW DOSE W/O CM
2 of 5 series · 15 of 40 positions shown, 18 images · non-contrast
Comparison: 04/01/2020 CTA chest. Lung cancer screening CT of
09/29/2018.

CLINICAL DATA: Ex-smoker, quitting 5 years ago. Seventy-nine
pack-year history. Vocal cord cancer with radiation therapy in 2636.
Colon cancer with resection last year.

EXAM:
CT CHEST WITHOUT CONTRAST LOW-DOSE FOR LUNG CANCER SCREENING
TECHNIQUE: Multidetector CT imaging of the chest was performed following the
standard protocol without IV contrast.

[Series 3: lung 1.00 · axial · 0.68mm/px · z∈[-1267,-935]mm · 12 of 366 slices shown, 15 images]
[im 17/366  mediastinal]
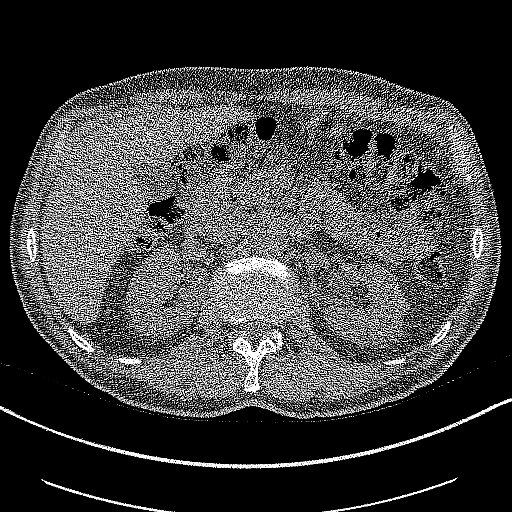
[im 17/366  lung]
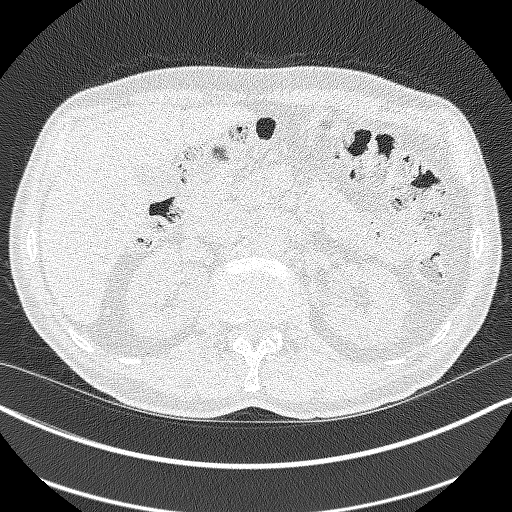
[im 50/366  lung]
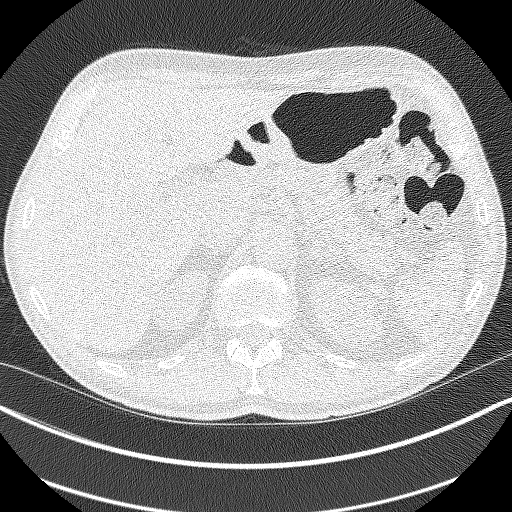
[im 83/366  lung]
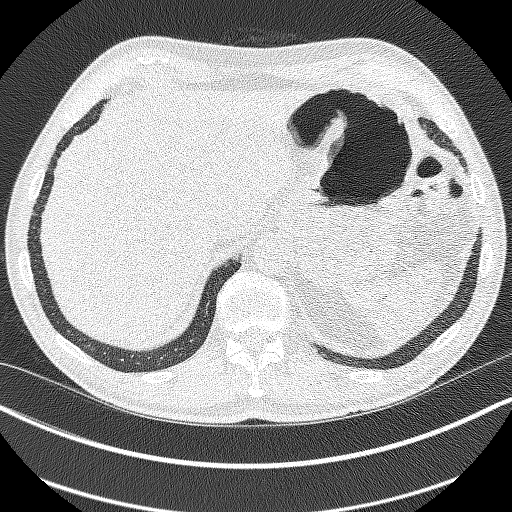
[im 117/366  lung]
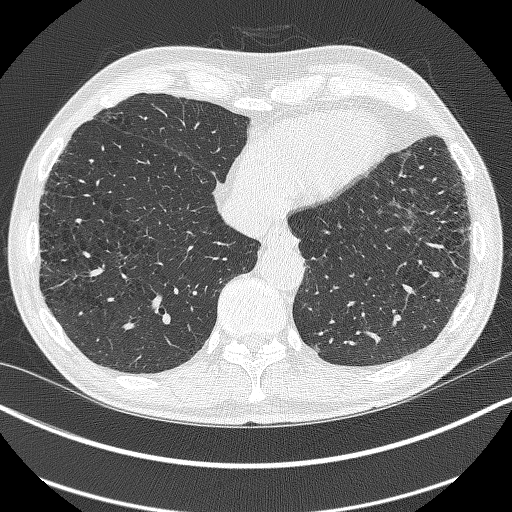
[im 133/366  mediastinal]
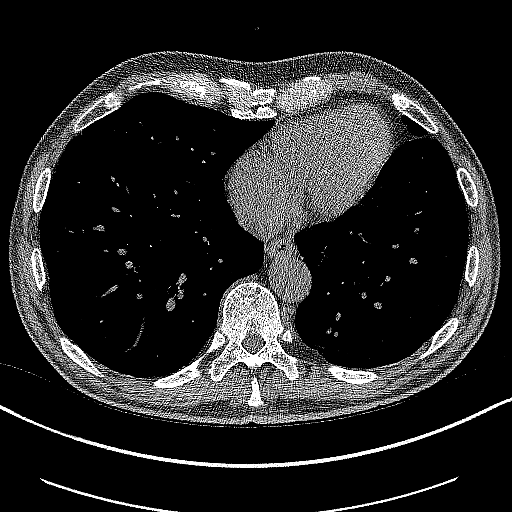
[im 133/366  lung]
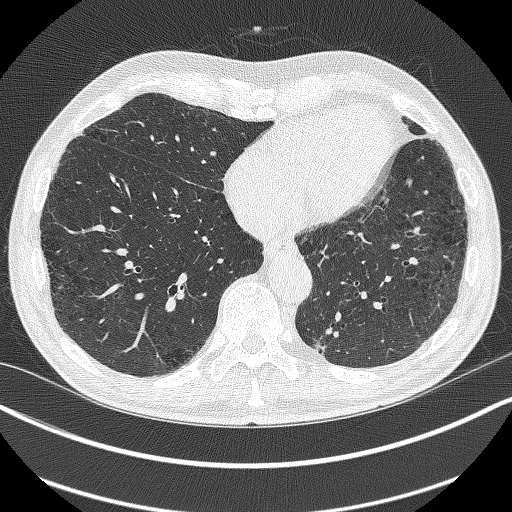
[im 166/366  lung]
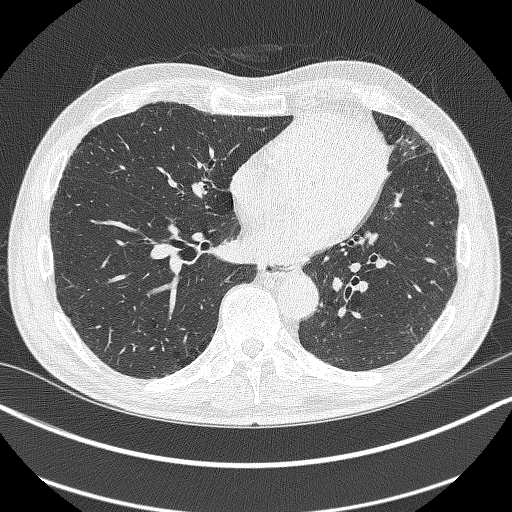
[im 200/366  lung]
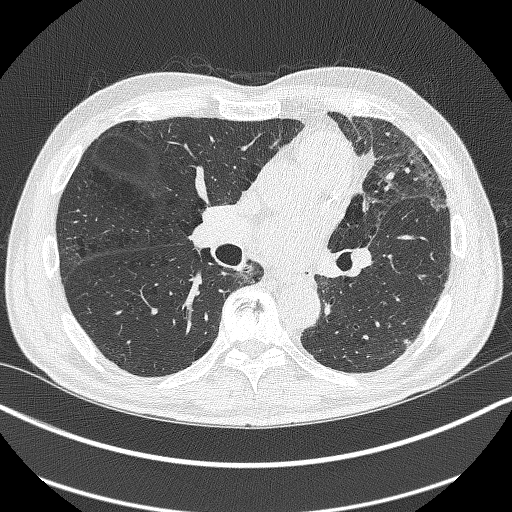
[im 233/366  lung]
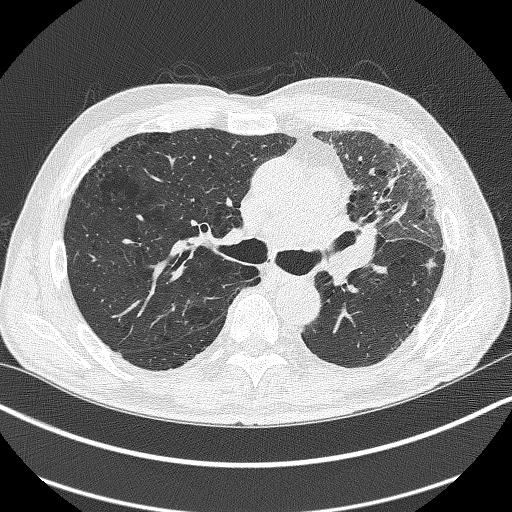
[im 249/366  mediastinal]
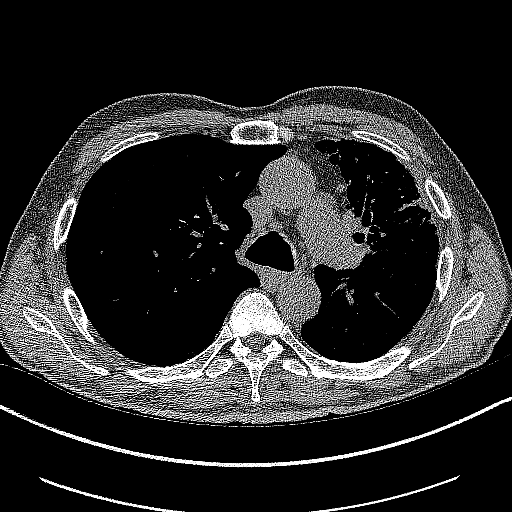
[im 249/366  lung]
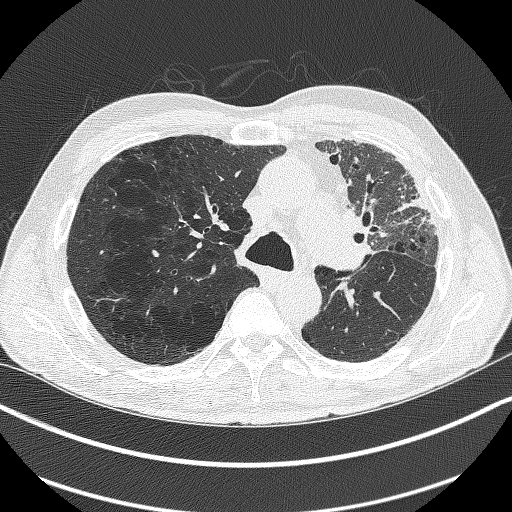
[im 283/366  lung]
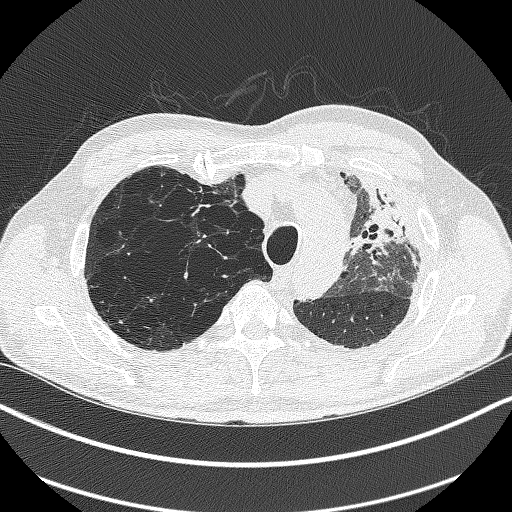
[im 316/366  lung]
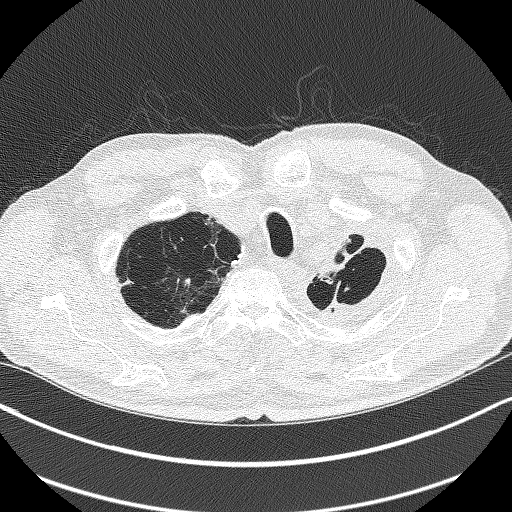
[im 349/366  lung]
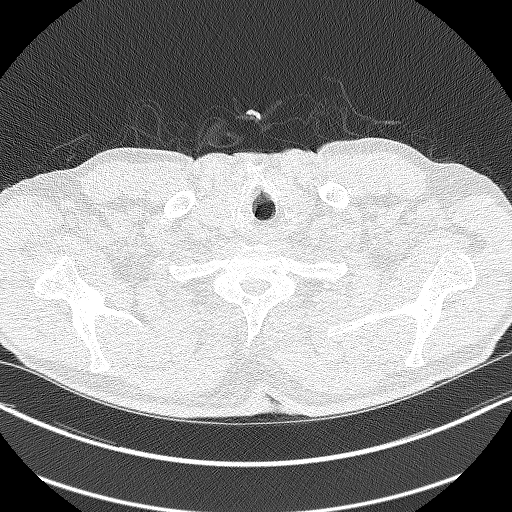

[Series 5: coronals lung 1.00 cor · coronal · 0.68mm/px · 3 of 269 slices shown]
[im 54/269  lung]
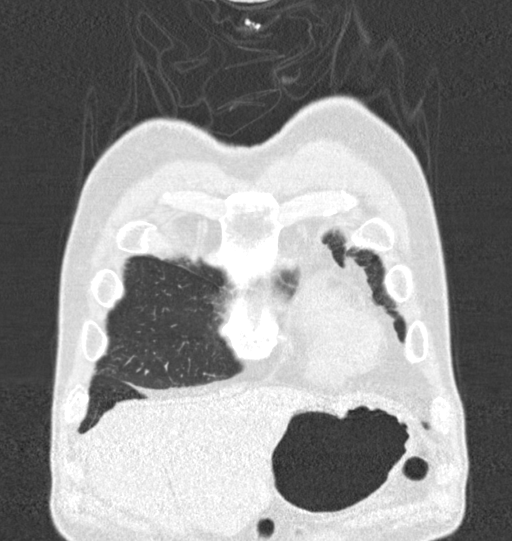
[im 108/269  lung]
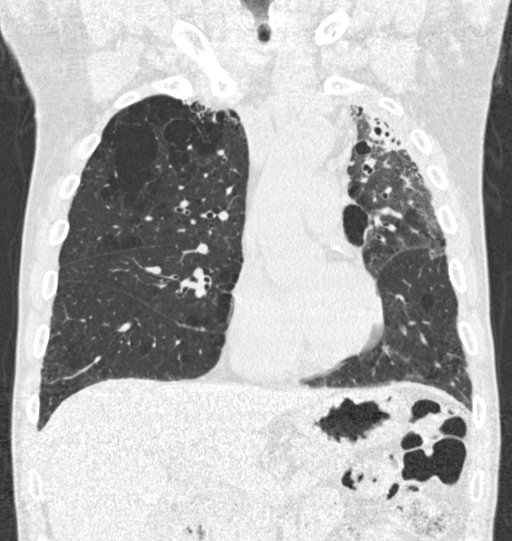
[im 161/269  lung]
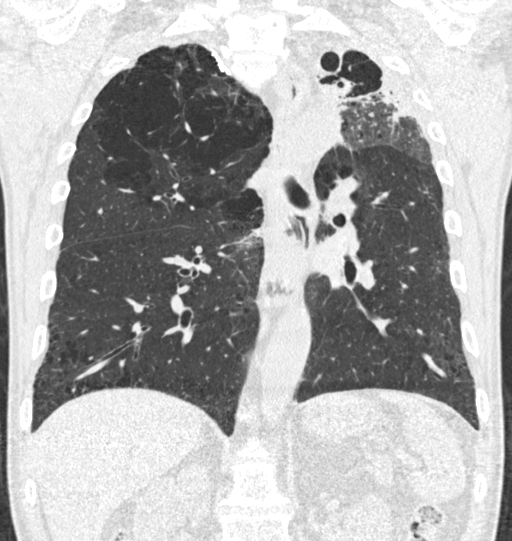

[15 of 40 positions shown; findings below may reference images not displayed]

FINDINGS: Cardiovascular: Aortic atherosclerosis. Tortuous thoracic aorta.
Normal heart size, without pericardial effusion. Lad and right
coronary artery calcification.

Mediastinum/Nodes: Hilar regions poorly evaluated without
intravenous contrast. Prevascular nodes of up to 1.0 cm on [DATE],
decreased from 1.4 cm on 04/01/2020.

Lungs/Pleura: No pleural fluid. Advanced bullous emphysema. Left
upper lobe volume loss, consolidation, and bronchiectasis anteriorly
are likely the sequelae of pneumonia on 04/01/2020 CTA. This limits
evaluation for left upper lobe lung nodule or mass.

A left lower lobe pulmonary nodule of volume derived equivalent
diameter 5.9 mm on 133/3 is new since the prior. Other scattered
smaller pulmonary nodules are relatively similar.

Upper Abdomen: Normal imaged portions of the liver, spleen, stomach,
gallbladder, pancreas, adrenal glands, kidneys.

Musculoskeletal: Midthoracic spondylosis.
IMPRESSION: 1. Lung-RADS 3, probably benign findings. Short-term follow-up in 6
months is recommended with repeat low-dose chest CT without contrast
(please use the following order, "CT CHEST LCS NODULE FOLLOW-UP W/O
CM"). New left upper lobe pulmonary nodule of volume derived
equivalent diameter 5.9 mm.
2. Left upper lobe volume loss, consolidation, and bronchiectasis
are likely the sequelae of pneumonia on the interval CTA of the
chest of 04/01/2020. This limits sensitivity for left upper lobe
nodule/mass.
3. Aortic atherosclerosis ([id number]), coronary artery
atherosclerosis and emphysema ([id number]).
4. Prevascular node is decreased in size since the prior, presumably
reactive.

## 2022-07-17 ENCOUNTER — Telehealth: Payer: Self-pay

## 2022-07-17 NOTE — Telephone Encounter (Signed)
Patient called in to schedule their colonoscopy. He stated that he wants to do it towards the end of August. Per Dr. Maximino Greenland I recommend you have a repeat colonoscopy in 2 years, with a 2 day prep, to determine if you have developed any new polyps and to screen for colorectal cancer. He wants Dr. Tobi Bastos to do his procedure.

## 2022-07-18 ENCOUNTER — Telehealth: Payer: Self-pay

## 2022-07-18 ENCOUNTER — Other Ambulatory Visit: Payer: Self-pay

## 2022-07-18 DIAGNOSIS — Z85038 Personal history of other malignant neoplasm of large intestine: Secondary | ICD-10-CM

## 2022-07-18 MED ORDER — PEG 3350-KCL-NA BICARB-NACL 420 G PO SOLR: 4000.0000 mL | Freq: Once | ORAL | 0 refills | Status: AC

## 2022-07-18 NOTE — Telephone Encounter (Signed)
Gastroenterology Pre-Procedure Review  Request Date: 09/01/22 Requesting Physician: Dr. Allegra Lai  2 days prior to procedure:  - Do not eat any solid foods or dairy products of any kind after 12 noon.  -  4 pm- Mix 64 ounces of Water with 238 grams of miralax. Drink 8oz every 20 minutes till solution is gone.  - Take your usual prescription medications ( except iron and / or any other stopped medications)     PATIENT REVIEW QUESTIONS: The patient responded to the following health history questions as indicated:    1. Are you having any GI issues? no 2. Do you have a personal history of Polyps? yes (personal history of colon cancer.  Last colonoscopy was 08/15/20 performed by Dr. Maximino Greenland recommended repeat in 2 years) 3. Do you have a family history of Colon Cancer or Polyps? no 4. Diabetes Mellitus? Yes controled with diet 5. Joint replacements in the past 12 months?no 6. Major health problems in the past 3 months?no 7. Any artificial heart valves, MVP, or defibrillator?no    MEDICATIONS & ALLERGIES:    Patient reports the following regarding taking any anticoagulation/antiplatelet therapy:   Plavix, Coumadin, Eliquis, Xarelto, Lovenox, Pradaxa, Brilinta, or Effient? no Aspirin? no  Patient confirms/reports the following medications:  Current Outpatient Medications  Medication Sig Dispense Refill   ACCU-CHEK GUIDE test strip      Coenzyme Q10 100 MG capsule Take by mouth.     Omega-3 1000 MG CAPS Take by mouth.     acetaminophen (TYLENOL) 500 MG tablet Take 500 mg by mouth every 6 (six) hours as needed (pain.).     ALEVE PM 220-25 MG TABS Take 0.5 tablets by mouth at bedtime.     beta carotene w/minerals (OCUVITE) tablet Take 1 tablet by mouth at bedtime.     Cholecalciferol (VITAMIN D3) 2000 units capsule Take 2,000 Units by mouth daily.      Ferrous Sulfate (IRON) 325 (65 Fe) MG TABS Take 1 tablet by mouth daily.     loperamide (IMODIUM) 2 MG capsule Take 2 mg by mouth as needed  for diarrhea or loose stools.     Misc Natural Products (OSTEO BI-FLEX TRIPLE STRENGTH PO) Take 1 tablet by mouth in the morning and at bedtime.     Multiple Vitamin (MULTIVITAMIN WITH MINERALS) TABS tablet Take 1 tablet by mouth daily.     simvastatin (ZOCOR) 80 MG tablet Take 80 mg by mouth at bedtime.      triamcinolone (NASACORT) 55 MCG/ACT AERO nasal inhaler Place 1 spray into the nose daily.      Turmeric (QC TUMERIC COMPLEX PO) Take by mouth.     No current facility-administered medications for this visit.    Patient confirms/reports the following allergies:  No Known Allergies  No orders of the defined types were placed in this encounter.   AUTHORIZATION INFORMATION Primary Insurance: 1D#: Group #:  Secondary Insurance: 1D#: Group #:  SCHEDULE INFORMATION: Date: 09/01/22 Time: Location: [facility]

## 2022-07-18 NOTE — Addendum Note (Signed)
Addended by: Avie Arenas on: 07/18/2022 09:39 AM   Modules accepted: Orders

## 2022-09-01 ENCOUNTER — Ambulatory Visit: Payer: Medicare PPO | Admitting: Anesthesiology

## 2022-09-01 ENCOUNTER — Ambulatory Visit
Admission: RE | Admit: 2022-09-01 | Discharge: 2022-09-01 | Disposition: A | Discharge status: Home / Self Care | Payer: Medicare PPO | Attending: Gastroenterology | Admitting: Gastroenterology

## 2022-09-01 ENCOUNTER — Encounter
Admission: RE | Disposition: A | Discharge status: Home / Self Care | Payer: Self-pay | Source: Home / Self Care | Attending: Gastroenterology

## 2022-09-01 DIAGNOSIS — Z86010 Personal history of colonic polyps: Secondary | ICD-10-CM | POA: Insufficient documentation

## 2022-09-01 DIAGNOSIS — K5730 Diverticulosis of large intestine without perforation or abscess without bleeding: Secondary | ICD-10-CM | POA: Insufficient documentation

## 2022-09-01 DIAGNOSIS — K219 Gastro-esophageal reflux disease without esophagitis: Secondary | ICD-10-CM | POA: Insufficient documentation

## 2022-09-01 DIAGNOSIS — D120 Benign neoplasm of cecum: Secondary | ICD-10-CM

## 2022-09-01 DIAGNOSIS — K635 Polyp of colon: Secondary | ICD-10-CM

## 2022-09-01 DIAGNOSIS — Z8521 Personal history of malignant neoplasm of larynx: Secondary | ICD-10-CM | POA: Insufficient documentation

## 2022-09-01 DIAGNOSIS — Z87891 Personal history of nicotine dependence: Secondary | ICD-10-CM | POA: Insufficient documentation

## 2022-09-01 DIAGNOSIS — D123 Benign neoplasm of transverse colon: Secondary | ICD-10-CM | POA: Insufficient documentation

## 2022-09-01 DIAGNOSIS — I10 Essential (primary) hypertension: Secondary | ICD-10-CM | POA: Insufficient documentation

## 2022-09-01 DIAGNOSIS — Z09 Encounter for follow-up examination after completed treatment for conditions other than malignant neoplasm: Secondary | ICD-10-CM | POA: Insufficient documentation

## 2022-09-01 DIAGNOSIS — Z85038 Personal history of other malignant neoplasm of large intestine: Secondary | ICD-10-CM | POA: Insufficient documentation

## 2022-09-01 DIAGNOSIS — J449 Chronic obstructive pulmonary disease, unspecified: Secondary | ICD-10-CM | POA: Insufficient documentation

## 2022-09-01 DIAGNOSIS — E119 Type 2 diabetes mellitus without complications: Secondary | ICD-10-CM | POA: Insufficient documentation

## 2022-09-01 HISTORY — PX: POLYPECTOMY: SHX5525

## 2022-09-01 HISTORY — PX: COLONOSCOPY WITH PROPOFOL: SHX5780

## 2022-09-01 SURGERY — COLONOSCOPY WITH PROPOFOL
Anesthesia: General

## 2022-09-01 MED ORDER — PHENYLEPHRINE HCL (PRESSORS) 10 MG/ML IV SOLN
INTRAVENOUS | Status: DC | PRN
  Administered 2022-09-01 (×2): 100 ug via INTRAVENOUS

## 2022-09-01 MED ORDER — PHENYLEPHRINE 80 MCG/ML (10ML) SYRINGE FOR IV PUSH (FOR BLOOD PRESSURE SUPPORT)
PREFILLED_SYRINGE | INTRAVENOUS | Status: AC
  Filled 2022-09-01: qty 10

## 2022-09-01 MED ORDER — PROPOFOL 500 MG/50ML IV EMUL
INTRAVENOUS | Status: DC | PRN
  Administered 2022-09-01: 125 ug/kg/min via INTRAVENOUS

## 2022-09-01 MED ORDER — PROPOFOL 10 MG/ML IV BOLUS
INTRAVENOUS | Status: DC | PRN
Start: 2022-09-01 — End: 2022-09-01
  Administered 2022-09-01: 60 mg via INTRAVENOUS

## 2022-09-01 MED ORDER — SODIUM CHLORIDE 0.9 % IV SOLN: INTRAVENOUS | Status: DC

## 2022-09-01 MED ORDER — LIDOCAINE HCL (CARDIAC) PF 100 MG/5ML IV SOSY
PREFILLED_SYRINGE | INTRAVENOUS | Status: DC | PRN
  Administered 2022-09-01: 50 mg via INTRAVENOUS

## 2022-09-01 NOTE — H&P (Signed)
Kirk Repress, MD [address]  [address]  [place], [place] [address]  Main: [phone]  Fax: [phone] Pager: [phone]  Primary Care Physician:  Orene Desanctis, MD Primary Gastroenterologist:  Dr. Arlyss Rodriguez  Pre-Procedure History & Physical: HPI:  Kirk Rodriguez is a 77 y.o. male is here for an colonoscopy.   Past Medical History:  Diagnosis Date   Allergy    Cancer (HCC)    Larynx--carcinoma in situ   Colon cancer (HCC)    COPD (chronic obstructive pulmonary disease) (HCC)    no inhalers   Diabetes mellitus without complication (HCC)    GERD (gastroesophageal reflux disease)    no meds   Headache    h/o migraines   Heart murmur    as a child   Hyperlipidemia    Wears dentures    full upper, partial lower    Past Surgical History:  Procedure Laterality Date   COLONOSCOPY WITH PROPOFOL N/A 06/02/2019   Procedure: COLONOSCOPY WITH PROPOFOL;  Surgeon: Pasty Spillers, MD;  Location: [facility] END[facility]COPY;  Service: Endoscopy;  Laterality: N/A;   COLONOSCOPY WITH PROPOFOL N/A 08/15/2020   Procedure: COLONOSCOPY WITH PROPOFOL;  Surgeon: Pasty Spillers, MD;  Location: [facility];  Service: Endoscopy;  Laterality: N/A;   CYSTOSCOPY N/A 06/29/2019   Procedure: CYSTOSCOPY;  Surgeon: Vanna Scotland, MD;  Location: [facility] ORS;  Service: Urology;  Laterality: N/A;   DIRECT LARYNGOSCOPY Right 04/03/2016   Procedure: DIRECT MICROLARYNGOSCOPY WITH EXCISON RIGHT VOCAL CORD LESION;  Surgeon: Vernie Murders, MD;  Location: [facility];  Service: ENT;  Laterality: Right;  RIGHT    INGUINAL HERNIA REPAIR Bilateral 12/18/2015   Procedure: LAPAROSCOPIC BILATERAL INGUINAL HERNIA REPAIR;  Surgeon: Gladis Riffle, MD;  Location: [facility] ORS;  Service: General;  Laterality: Bilateral;   MINOR EXCISION OF ORAL LESION Left 03/06/2016   Procedure: direct microlaryngoscopy with excision left vocal cord lesion;  Surgeon: Vernie Murders, MD;  Location: [facility];   Service: ENT;  Laterality: Left;  left vocal cord lesion   TRIGGER FINGER RELEASE Left 2016   VASECTOMY Bilateral 1978   XI ROBOTIC ASSISTED LOWER ANTERIOR RESECTION N/A 06/29/2019   Procedure: XI ROBOTIC ASSISTED LOWER ANTERIOR RESECTION;  Surgeon: Campbell Lerner, MD;  Location: [facility] ORS;  Service: General;  Laterality: N/A;    Prior to Admission medications   Medication Sig Start Date End Date Taking? Authorizing Provider  ACCU-CHEK GUIDE test strip  04/03/22   [provider]  acetaminophen (TYLENOL) 500 MG tablet Take 500 mg by mouth every 6 (six) hours as needed (pain.).    [provider]  ALEVE PM 220-25 MG TABS Take 0.5 tablets by mouth at bedtime.    [provider]  beta carotene w/minerals (OCUVITE) tablet Take 1 tablet by mouth at bedtime.    [provider]  Cholecalciferol (VITAMIN D3) 2000 units capsule Take 2,000 Units by mouth daily.     [provider]  Coenzyme Q10 100 MG capsule Take by mouth. Patient not taking: Reported on 07/18/2022 04/11/22   [provider]  Ferrous Sulfate (IRON) 325 (65 Fe) MG TABS Take 1 tablet by mouth daily.    [provider]  loperamide (IMODIUM) 2 MG capsule Take 2 mg by mouth as needed for diarrhea or loose stools.    [provider]  Misc Natural Products (OSTEO BI-FLEX TRIPLE STRENGTH PO) Take 1 tablet by mouth in the morning and at bedtime.    [provider]  Multiple Vitamin (MULTIVITAMIN WITH MINERALS) TABS tablet Take 1 tablet by mouth daily.    [provider]  Omega-3 1000 MG CAPS Take by mouth. Patient not taking: Reported on 07/18/2022 04/11/22   [provider]  simvastatin (ZOCOR) 80 MG tablet Take 80 mg by mouth at bedtime.  11/25/15   [provider]  triamcinolone (NASACORT) 55 MCG/ACT AERO nasal inhaler Place 1 spray into the nose daily.     [provider]  Turmeric (QC TUMERIC COMPLEX PO) Take by mouth.     [provider]    Allergies as of 07/18/2022   (No Known Allergies)    Family History  Problem Relation Age of Onset   Lung cancer Father    Heart disease Father    Hypertension Father    Polycythemia Mother    Heart disease Mother     Social History   Socioeconomic History   Marital status: Married    Spouse name: Not on file   Number of children: Not on file   Years of education: Not on file   Highest education level: Not on file  Occupational History   Not on file  Tobacco Use   Smoking status: Former    Current packs/day: 0.00    Average packs/day: 1.5 packs/day for 53.0 years (79.5 ttl pk-yrs)    Types: Cigarettes    Start date: 04/18/1962    Quit date: 04/18/2015    Years since quitting: 7.3   Smokeless tobacco: Former  Building services engineer status: Never Used  Substance and Sexual Activity   Alcohol use: Yes    Comment: 6-12 beers in 1 year   Drug use: No   Sexual activity: Not Currently  Other Topics Concern   Not on file  Social History Narrative   Not on file   Social Determinants of Health   Financial Resource Strain: Low Risk  (01/14/2022)   Received from [facility], [facility]   Overall Financial Resource Strain (CARDIA)    Difficulty of Paying Living Expenses: Not hard at all  Food Insecurity: Unknown (01/14/2022)   Received from [facility], [facility]   Hunger Vital Sign    Worried About Running Out of Food in the Last Year: Never true    Ran Out of Food in the Last Year: Not on file  Transportation Needs: No Transportation Needs (01/14/2022)   Received from [facility], [facility]   PRAPARE - Transportation    In the past 12 months, has lack of transportation kept you from medical appointments or from getting medications?: No    Lack of Transportation (Non-Medical): No  Physical Activity: Not on file  Stress: Not on file   Social Connections: Not on file  Intimate Partner Violence: Not on file    Review of Systems: See HPI, otherwise negative ROS  Physical Exam: BP 115/76   Pulse 81   Temp 97.8 F (36.6 C) (Temporal)   Resp 16   Ht 5\' 11"  (1.803 m)   Wt 71.7 kg   SpO2 100%   BMI 22.04 kg/m  General:   Alert,  pleasant and cooperative in NAD Head:  Normocephalic and atraumatic. Neck:  Supple; no masses or thyromegaly. Lungs:  Clear throughout to auscultation.    Heart:  Regular rate and rhythm. Abdomen:  Soft, nontender and nondistended. Normal bowel sounds, without guarding, and without rebound.  Neurologic:  Alert and  oriented x4;  grossly normal neurologically.  Impression/Plan: Kirk Rodriguez is here for an colonoscopy to be performed for h/o colon adenomas  Risks, benefits, limitations, and alternatives regarding  colonoscopy have been reviewed with the patient.  Questions have been answered.  All parties agreeable.   Lannette Donath, MD  09/01/2022, 9:02 AM

## 2022-09-01 NOTE — Anesthesia Postprocedure Evaluation (Signed)
Anesthesia Post Note  Patient: Kirk Rodriguez  Procedure(s) Performed: COLONOSCOPY WITH PROPOFOL POLYPECTOMY  Patient location during evaluation: Endoscopy Anesthesia Type: General Level of consciousness: awake and alert Pain management: pain level controlled Vital Signs Assessment: post-procedure vital signs reviewed and stable Respiratory status: spontaneous breathing, nonlabored ventilation, respiratory function stable and patient connected to nasal cannula oxygen Cardiovascular status: blood pressure returned to baseline and stable Postop Assessment: no apparent nausea or vomiting Anesthetic complications: no   No notable events documented.   Last Vitals:  Vitals:   09/01/22 1002 09/01/22 1012  BP: 98/65 101/66  Pulse: 94 70  Resp: 17 14  Temp:    SpO2: 100% 100%    Last Pain:  Vitals:   09/01/22 1012  TempSrc:   PainSc: 0-No pain                 Cleda Mccreedy Kasidy Gianino

## 2022-09-01 NOTE — Transfer of Care (Signed)
Immediate Anesthesia Transfer of Care Note  Patient: ERO DEVENEY  Procedure(s) Performed: COLONOSCOPY WITH PROPOFOL POLYPECTOMY  Patient Location: PACU  Anesthesia Type:General  Level of Consciousness: oriented, drowsy, and patient cooperative  Airway & Oxygen Therapy: Patient Spontanous Breathing  Post-op Assessment: Report given to RN and Post -op Vital signs reviewed and stable  Post vital signs: Reviewed and stable  Last Vitals:  Vitals Value Taken Time  BP 87/60 09/01/22 0952  Temp 36.5 C 09/01/22 0952  Pulse 63 09/01/22 0952  Resp 16 09/01/22 0952  SpO2 100 % 09/01/22 0952  Vitals shown include unfiled device data.  Last Pain:  Vitals:   09/01/22 0952  TempSrc: Tympanic  PainSc: Asleep         Complications: No notable events documented.

## 2022-09-01 NOTE — Anesthesia Preprocedure Evaluation (Signed)
Anesthesia Evaluation  Patient identified by MRN, date of birth, ID band Patient awake    Reviewed: Allergy & Precautions, NPO status , Patient's Chart, lab work & pertinent test results  History of Anesthesia Complications Negative for: history of anesthetic complications  Airway Mallampati: III  TM Distance: <3 FB Neck ROM: full    Dental  (+) Chipped, Upper Dentures, Partial Lower, Poor Dentition, Missing   Pulmonary shortness of breath and with exertion, COPD, former smoker   Pulmonary exam normal        Cardiovascular Exercise Tolerance: Good hypertension, (-) angina Normal cardiovascular exam     Neuro/Psych  Headaches  negative psych ROS   GI/Hepatic Neg liver ROS,GERD  Controlled,,  Endo/Other  negative endocrine ROSdiabetes, Type 2    Renal/GU negative Renal ROS  negative genitourinary   Musculoskeletal   Abdominal   Peds  Hematology negative hematology ROS (+)   Anesthesia Other Findings Past Medical History: No date: Allergy No date: Cancer [facility])     Comment:  Larynx--carcinoma in situ No date: Colon cancer (HCC) No date: COPD (chronic obstructive pulmonary disease) (HCC)     Comment:  no inhalers No date: Diabetes mellitus without complication (HCC) No date: GERD (gastroesophageal reflux disease)     Comment:  no meds No date: Headache     Comment:  h/o migraines No date: Heart murmur     Comment:  as a child No date: Hyperlipidemia No date: Wears dentures     Comment:  full upper, partial lower  Past Surgical History: 06/02/2019: COLONOSCOPY WITH PROPOFOL; N/A     Comment:  Procedure: COLONOSCOPY WITH PROPOFOL;  Surgeon:               Pasty Spillers, MD;  Location: [facility] END[facility]COPY;                Service: Endoscopy;  Laterality: N/A; 08/15/2020: COLONOSCOPY WITH PROPOFOL; N/A     Comment:  Procedure: COLONOSCOPY WITH PROPOFOL;  Surgeon:               Pasty Spillers, MD;   Location: [facility] END[facility];                Service: Endoscopy;  Laterality: N/A; 06/29/2019: Bluford Kaufmann; N/A     Comment:  Procedure: CYSTOSCOPY;  Surgeon: Vanna Scotland, MD;                Location: [facility] ORS;  Service: Urology;  Laterality: N/A; 04/03/2016: DIRECT LARYNGOSCOPY; Right     Comment:  Procedure: DIRECT MICROLARYNGOSCOPY WITH EXCISON RIGHT               VOCAL CORD LESION;  Surgeon: Vernie Murders, MD;  Location:              [facility];  Service: ENT;  Laterality: Right;                RIGHT  12/18/2015: INGUINAL HERNIA REPAIR; Bilateral     Comment:  Procedure: LAPAROSCOPIC BILATERAL INGUINAL HERNIA               REPAIR;  Surgeon: Gladis Riffle, MD;  Location: [facility]              ORS;  Service: General;  Laterality: Bilateral; 03/06/2016: MINOR EXCISION OF ORAL LESION; Left     Comment:  Procedure: direct microlaryngoscopy with excision left               vocal cord lesion;  Surgeon: Vernie Murders, MD;  Location:              [facility];  Service: ENT;  Laterality: Left;                left vocal cord lesion 2016: TRIGGER FINGER RELEASE; Left 1978: VASECTOMY; Bilateral 06/29/2019: XI ROBOTIC ASSISTED LOWER ANTERIOR RESECTION; N/A     Comment:  Procedure: XI ROBOTIC ASSISTED LOWER ANTERIOR RESECTION;              Surgeon: Campbell Lerner, MD;  Location: [facility] ORS;                Service: General;  Laterality: N/A;  BMI    Body Mass Index: 22.04 kg/m      Reproductive/Obstetrics negative OB ROS                             Anesthesia Physical Anesthesia Plan  ASA: 3  Anesthesia Plan: General   Post-op Pain Management:    Induction: Intravenous  PONV Risk Score and Plan: Propofol infusion and TIVA  Airway Management Planned: Natural Airway and Nasal Cannula  Additional Equipment:   Intra-op Plan:   Post-operative Plan:   Informed Consent: I have reviewed the patients History and Physical, chart, labs and discussed  the procedure including the risks, benefits and alternatives for the proposed anesthesia with the patient or authorized representative who has indicated his/her understanding and acceptance.     Dental Advisory Given  Plan Discussed with: Anesthesiologist, CRNA and Surgeon  Anesthesia Plan Comments: (Patient consented for risks of anesthesia including but not limited to:  - adverse reactions to medications - risk of airway placement if required - damage to eyes, teeth, lips or other oral mucosa - nerve damage due to positioning  - sore throat or hoarseness - Damage to heart, brain, nerves, lungs, other parts of body or loss of life  Patient voiced understanding.)       Anesthesia Quick Evaluation

## 2022-09-01 NOTE — Op Note (Signed)
[facility] Gastroenterology Patient Name: Kirk Rodriguez Procedure Date: 09/01/2022 9:00 AM MRN: [id number] Account #: [id number] Date of Birth: February 27, 1945 Admit Type: Outpatient Age: 77 Room: [facility] ENDO ROOM 4 Gender: Male Note Status: Finalized Instrument Name: Peds Colonoscope [id number] Procedure:             Colonoscopy Indications:           Surveillance: History of adenomatous polyps,                         inadequate prep on last exam (<2yr), Last colonoscopy:                         August 2022 Providers:             Toney Reil MD, MD Referring MD:          Toney Reil MD, MD (Referring MD), Daniel Nones, MD (Referring MD) Medicines:             General Anesthesia Complications:         No immediate complications. Estimated blood loss: None. Procedure:             Pre-Anesthesia Assessment:                        - Prior to the procedure, a History and Physical was                         performed, and patient medications and allergies were                         reviewed. The patient is competent. The risks and                         benefits of the procedure and the sedation options and                         risks were discussed with the patient. All questions                         were answered and informed consent was obtained.                         Patient identification and proposed procedure were                         verified by the physician, the nurse, the                         anesthesiologist, the anesthetist and the technician                         in the pre-procedure area in the procedure room in the                         endoscopy suite. Mental Status Examination: alert and  oriented. Airway Examination: normal oropharyngeal                         airway and neck mobility. Respiratory Examination:                         clear to auscultation. CV Examination:  normal.                         Prophylactic Antibiotics: The patient does not require                         prophylactic antibiotics. Prior Anticoagulants: The                         patient has taken no anticoagulant or antiplatelet                         agents. ASA Grade Assessment: III - A patient with                         severe systemic disease. After reviewing the risks and                         benefits, the patient was deemed in satisfactory                         condition to undergo the procedure. The anesthesia                         plan was to use general anesthesia. Immediately prior                         to administration of medications, the patient was                         re-assessed for adequacy to receive sedatives. The                         heart rate, respiratory rate, oxygen saturations,                         blood pressure, adequacy of pulmonary ventilation, and                         response to care were monitored throughout the                         procedure. The physical status of the patient was                         re-assessed after the procedure.                        After obtaining informed consent, the colonoscope was                         passed under direct vision. Throughout the procedure,  the patient's blood pressure, pulse, and oxygen                         saturations were monitored continuously. The                         Colonoscope was introduced through the anus and                         advanced to the the cecum, identified by appendiceal                         orifice and ileocecal valve. The colonoscopy was                         performed without difficulty. The patient tolerated                         the procedure well. The quality of the bowel                         preparation was fair. Findings:      The perianal and digital rectal examinations were normal. Pertinent        negatives include normal sphincter tone and no palpable rectal lesions.      A 7 mm polyp was found in the cecum. The polyp was sessile. The polyp       was removed with a cold snare. Resection and retrieval were complete.      Four sessile polyps were found in the transverse colon. The polyps were       diminutive in size. These polyps were removed with a jumbo cold forceps.       Resection and retrieval were complete.      A few medium-mouthed diverticula were found in the recto-sigmoid colon       and sigmoid colon.      The retroflexed view of the distal rectum and anal verge was normal and       showed no anal or rectal abnormalities.      A 5 mm polyp was found in the ascending colon. The polyp was sessile.       The polyp was removed with a cold snare. Resection and retrieval were       complete. Impression:            - Preparation of the colon was fair.                        - One 7 mm polyp in the cecum, removed with a cold                         snare. Resected and retrieved.                        - Four diminutive polyps in the transverse colon,                         removed with a jumbo cold forceps. Resected and  retrieved.                        - Diverticulosis in the recto-sigmoid colon and in the                         sigmoid colon.                        - The distal rectum and anal verge are normal on                         retroflexion view.                        - One 5 mm polyp in the ascending colon, removed with                         a cold snare. Resected and retrieved. Recommendation:        - Discharge patient to home (with escort).                        - Resume previous diet today.                        - Continue present medications.                        - Await pathology results.                        - Repeat colonoscopy in 3 years with 2 day prep for                         surveillance of multiple  polyps. Procedure Code(s):     --- Professional ---                        [phone], Colonoscopy, flexible; with removal of                         tumor(s), polyp(s), or other lesion(s) by snare                         technique                        45380, 59, Colonoscopy, flexible; with biopsy, single                         or multiple Diagnosis Code(s):     --- Professional ---                        Z86.010, Personal history of colonic polyps                        D12.0, Benign neoplasm of cecum                        D12.3, Benign neoplasm of transverse colon (hepatic  flexure or splenic flexure)                        K57.30, Diverticulosis of large intestine without                         perforation or abscess without bleeding CPT copyright 2022 American [employer]. All rights reserved. The codes documented in this report are preliminary and upon coder review may  be revised to meet current compliance requirements. Dr. Libby Maw Toney Reil MD, MD 09/01/2022 9:52:40 AM This report has been signed electronically. Number of Addenda: 0 Note Initiated On: 09/01/2022 9:00 AM Scope Withdrawal Time: 0 hours 22 minutes 57 seconds  Total Procedure Duration: 0 hours 26 minutes 37 seconds  Estimated Blood Loss:  Estimated blood loss: none.      [facility]

## 2022-09-02 ENCOUNTER — Encounter: Payer: Self-pay | Admitting: Gastroenterology

## 2022-10-15 IMAGING — CT CT CHEST W/ CM
2 of 4 series · 14 of 36 positions shown, 17 images · IV contrast (agent unspecified)
Comparison: Chest CT 06/26/2020 and 04/01/2020.

CLINICAL DATA: Colorectal cancer staging. Intermittent shortness of
breath on exertion. History of pneumonia 10 months ago. History of
colon cancer with partial colon resection in [DATE]. History of
laryngeal carcinoma with radiation therapy in 8554.

EXAM:
CT CHEST WITH CONTRAST
TECHNIQUE: Multidetector CT imaging of the chest was performed during
intravenous contrast administration.

[Series 2: axial chest 2.00 · axial · 0.67mm/px · z∈[-1263,-941]mm · 11 of 191 slices shown, 14 images]
[im 15/191  mediastinal]
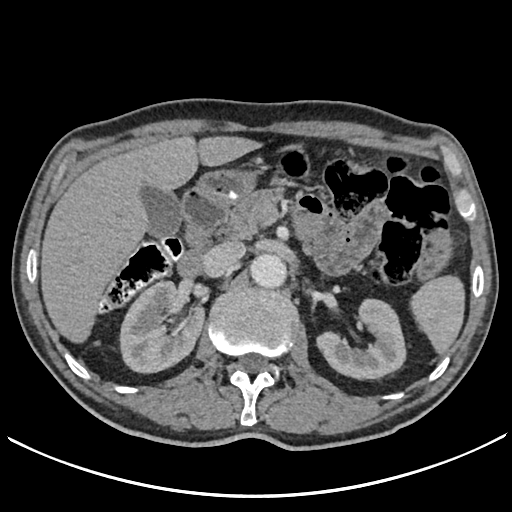
[im 15/191  lung]
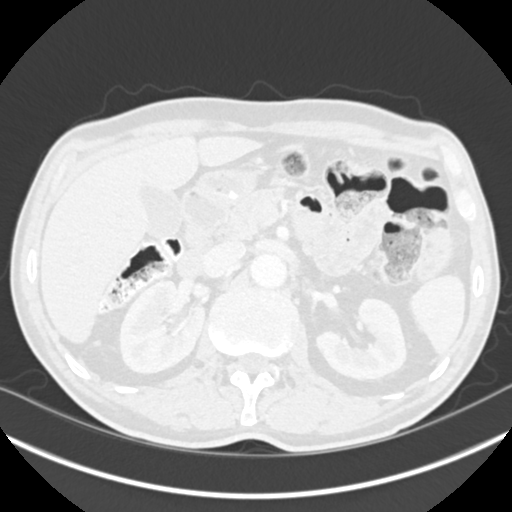
[im 30/191  lung]
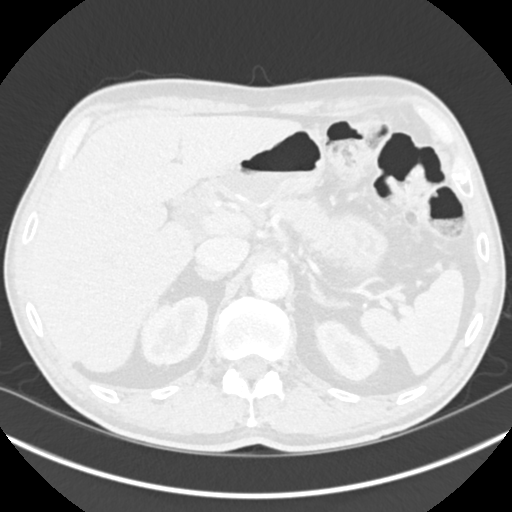
[im 44/191  lung]
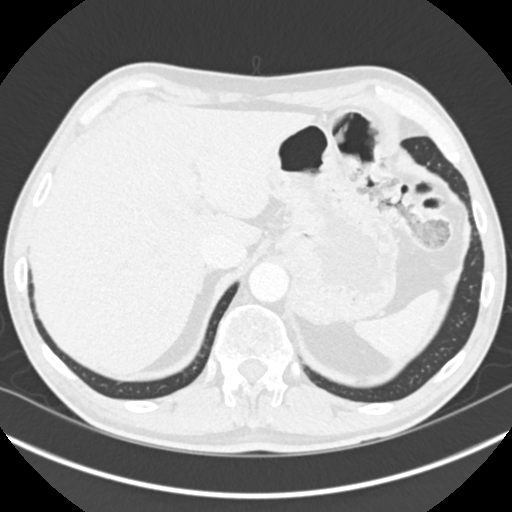
[im 59/191  lung]
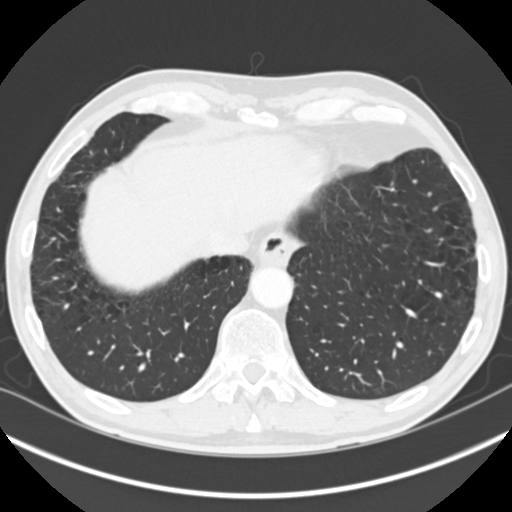
[im 74/191  mediastinal]
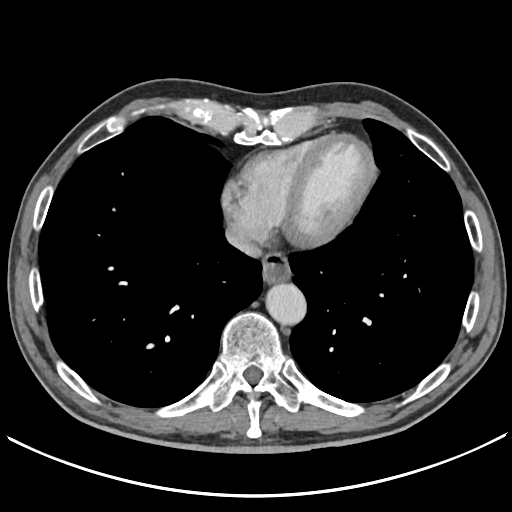
[im 74/191  lung]
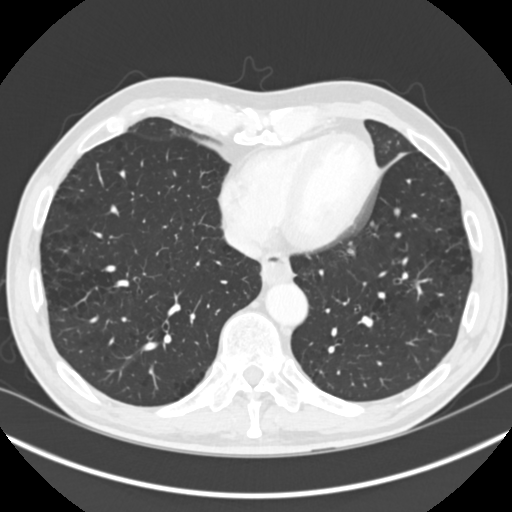
[im 103/191  lung]
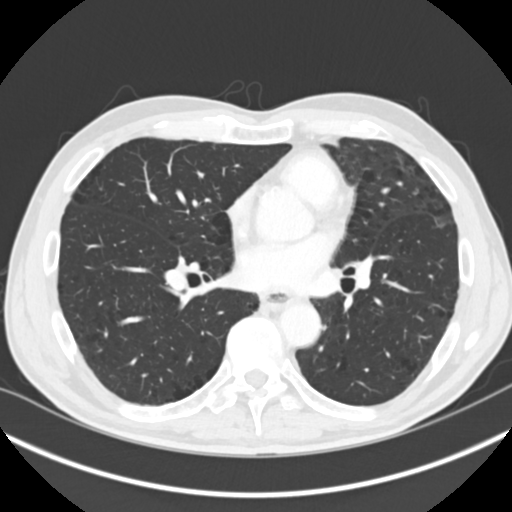
[im 117/191  lung]
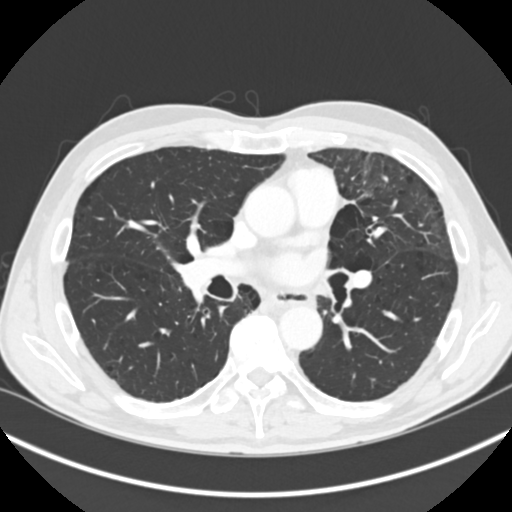
[im 132/191  lung]
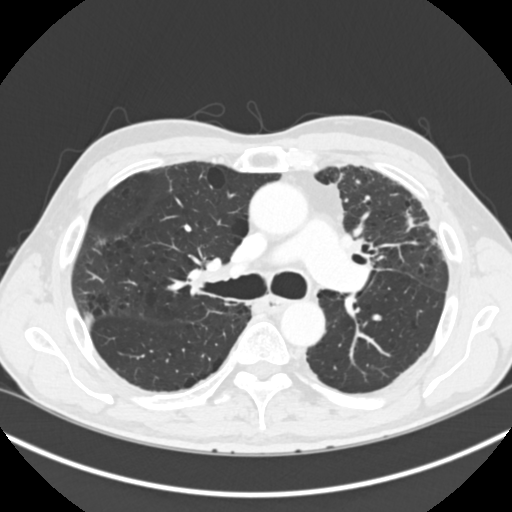
[im 147/191  mediastinal]
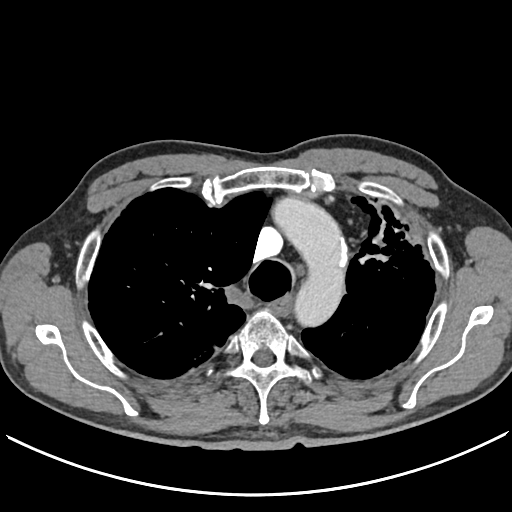
[im 147/191  lung]
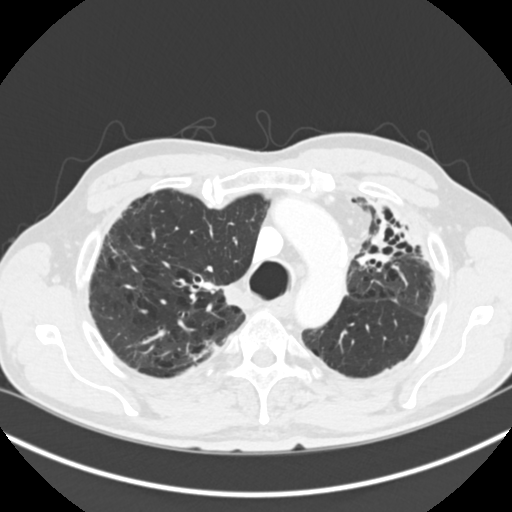
[im 161/191  lung]
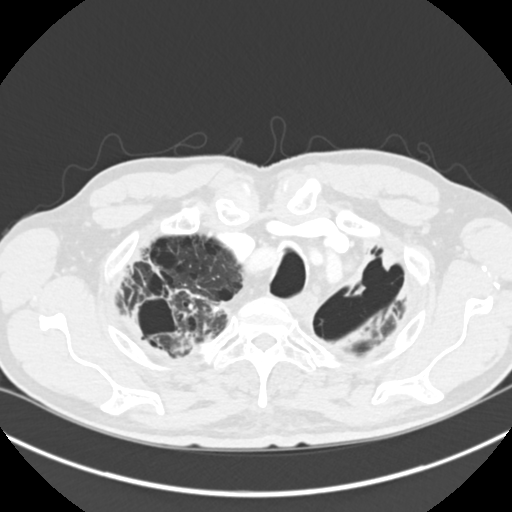
[im 176/191  lung]
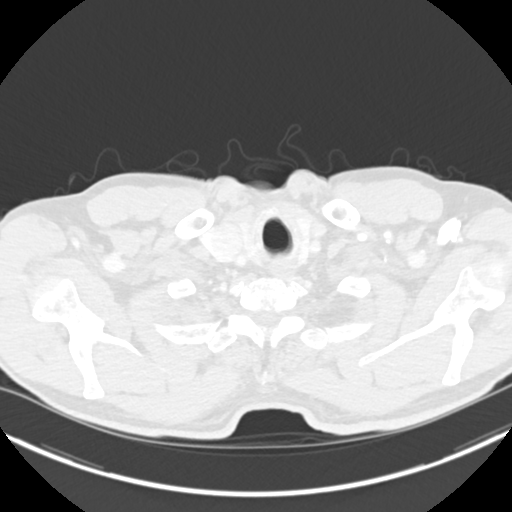

[Series 4: coronal chest 2.00 cor · coronal · 0.67mm/px · 3 of 132 slices shown]
[im 27/132  lung]
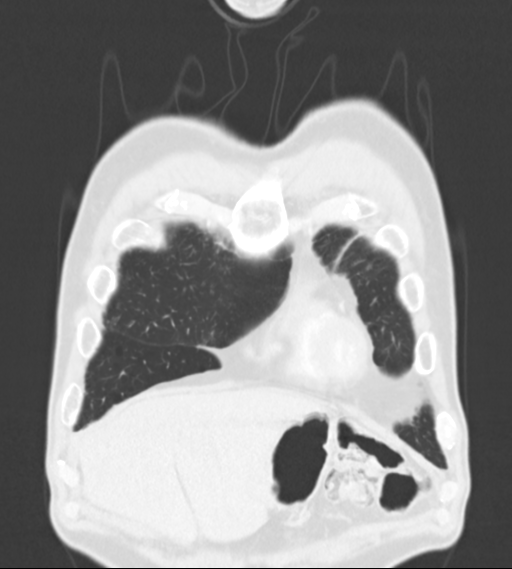
[im 53/132  lung]
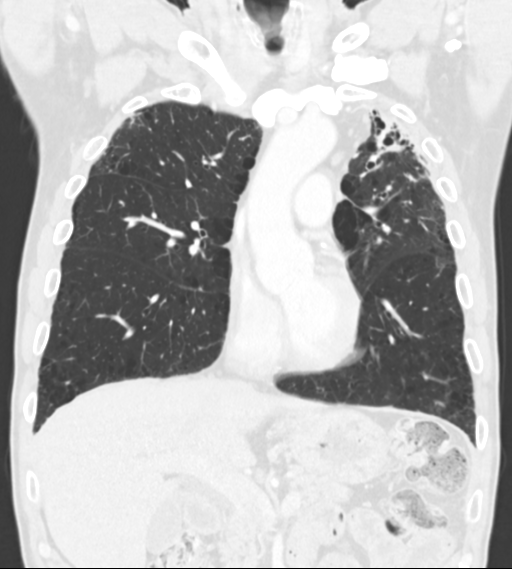
[im 79/132  lung]
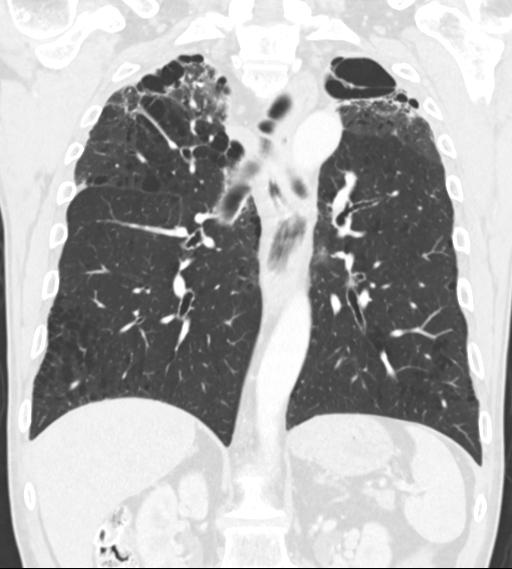

[14 of 36 positions shown; findings below may reference images not displayed]

RADIATION DOSE REDUCTION: This exam was performed according to the
departmental dose-optimization program which includes automated
exposure control, adjustment of the mA and/or kV according to
patient size and/or use of iterative reconstruction technique.

CONTRAST:  75mL OMNIPAQUE IOHEXOL 300 MG/ML  SOLN
FINDINGS: Cardiovascular: No acute vascular findings are seen. There is
atherosclerosis of the aorta, great vessels and coronary arteries.
The heart size is normal. There is no pericardial effusion.

Mediastinum/Nodes: There are no enlarged mediastinal, hilar or
axillary lymph nodes.Stable small mediastinal and hilar lymph nodes.
The thyroid gland and trachea demonstrate no significant findings.
There is stable mild dilatation of the distal esophagus with
associated mild wall thickening.

Lungs/Pleura: No pleural effusion or pneumothorax. At least moderate
centrilobular and paraseptal emphysema is again noted. There is
chronic volume loss in both upper lobes, especially on the left with
there is bronchiectasis and peribronchial consolidation, similar to
the most recent study. In the right upper lobe, there are new
subpleural apical opacities which are likely inflammatory. There are
chronic bullous changes at both lung apices with calcifications on
the right. No suspicious pulmonary nodules. The small left lower
lobe nodule seen on the most recent study has resolved.

Upper abdomen: The visualized upper abdomen appears stable without
significant findings. Tiny cysts in the dome of the liver is
unchanged, previously evaluated by MRI.

Musculoskeletal/Chest wall: There is no chest wall mass or
suspicious osseous finding. Stable mild multilevel spondylosis.
IMPRESSION: 1. The new 6 mm nodule in the left lower lobe on most recent CT of 7
months ago has resolved, consistent with a benign finding.
2. No suspicious pulmonary nodules are identified.
3. Chronic sequela of left upper lobe pneumonia with volume loss,
bronchiectasis and consolidation, similar to most recent study.
There are new milder contralateral findings at the right lung apex
which are likely inflammatory/infectious as well. Findings are
unlikely to be related to the patient's remote radiation therapy for
head and neck cancer.
4. Aortic Atherosclerosis (NYSTB-JZJ.J) and Emphysema (NYSTB-QJF.Q).
Coronary artery atherosclerosis.

## 2023-01-28 ENCOUNTER — Inpatient Hospital Stay: Payer: Medicare PPO

## 2023-01-28 ENCOUNTER — Inpatient Hospital Stay: Payer: Medicare PPO | Admitting: Oncology

## 2023-02-20 ENCOUNTER — Other Ambulatory Visit: Payer: Self-pay | Admitting: *Deleted

## 2023-02-20 DIAGNOSIS — Z08 Encounter for follow-up examination after completed treatment for malignant neoplasm: Secondary | ICD-10-CM

## 2023-02-23 ENCOUNTER — Inpatient Hospital Stay: Payer: Medicare PPO | Attending: Oncology

## 2023-02-23 ENCOUNTER — Encounter: Payer: Self-pay | Admitting: Oncology

## 2023-02-23 ENCOUNTER — Other Ambulatory Visit: Payer: Self-pay

## 2023-02-23 ENCOUNTER — Inpatient Hospital Stay: Payer: Medicare PPO | Admitting: Oncology

## 2023-02-23 VITALS — BP 119/85 | HR 59 | Temp 95.9°F | Resp 17 | Wt 170.0 lb

## 2023-02-23 DIAGNOSIS — Z85038 Personal history of other malignant neoplasm of large intestine: Secondary | ICD-10-CM | POA: Insufficient documentation

## 2023-02-23 DIAGNOSIS — Z08 Encounter for follow-up examination after completed treatment for malignant neoplasm: Secondary | ICD-10-CM | POA: Diagnosis not present

## 2023-02-23 DIAGNOSIS — Z87891 Personal history of nicotine dependence: Secondary | ICD-10-CM | POA: Diagnosis not present

## 2023-02-23 LAB — CBC WITH DIFFERENTIAL/PLATELET
Abs Immature Granulocytes: 0.02 10*3/uL (ref 0.00–0.07)
Basophils Absolute: 0.1 10*3/uL (ref 0.0–0.1)
Basophils Relative: 1 %
Eosinophils Absolute: 0.2 10*3/uL (ref 0.0–0.5)
Eosinophils Relative: 2 %
HCT: 38.5 % — ABNORMAL LOW (ref 39.0–52.0)
Hemoglobin: 12.7 g/dL — ABNORMAL LOW (ref 13.0–17.0)
Immature Granulocytes: 0 %
Lymphocytes Relative: 31 %
Lymphs Abs: 2.5 10*3/uL (ref 0.7–4.0)
MCH: 29.9 pg (ref 26.0–34.0)
MCHC: 33 g/dL (ref 30.0–36.0)
MCV: 90.6 fL (ref 80.0–100.0)
Monocytes Absolute: 0.8 10*3/uL (ref 0.1–1.0)
Monocytes Relative: 11 %
Neutro Abs: 4.3 10*3/uL (ref 1.7–7.7)
Neutrophils Relative %: 55 %
Platelets: 276 10*3/uL (ref 150–400)
RBC: 4.25 MIL/uL (ref 4.22–5.81)
RDW: 14.1 % (ref 11.5–15.5)
WBC: 7.9 10*3/uL (ref 4.0–10.5)
nRBC: 0 % (ref 0.0–0.2)

## 2023-02-23 LAB — CMP (CANCER CENTER ONLY)
ALT: 16 U/L (ref 0–44)
AST: 13 U/L — ABNORMAL LOW (ref 15–41)
Albumin: 4.2 g/dL (ref 3.5–5.0)
Alkaline Phosphatase: 40 U/L (ref 38–126)
Anion gap: 8 (ref 5–15)
BUN: 27 mg/dL — ABNORMAL HIGH (ref 8–23)
CO2: 29 mmol/L (ref 22–32)
Calcium: 9.3 mg/dL (ref 8.9–10.3)
Chloride: 100 mmol/L (ref 98–111)
Creatinine: 1.17 mg/dL (ref 0.61–1.24)
GFR, Estimated: >60 mL/min (ref >60)
Glucose, Bld: 106 mg/dL — ABNORMAL HIGH (ref 70–99)
Potassium: 4.8 mmol/L (ref 3.5–5.1)
Sodium: 137 mmol/L (ref 135–145)
Total Bilirubin: 0.6 mg/dL (ref 0.0–1.2)
Total Protein: 7 g/dL (ref 6.5–8.1)

## 2023-02-23 NOTE — Progress Notes (Signed)
Hematology/Oncology Consult note [facility]  Telephone:([phone] [phone]  Patient Care Team: Orene Desanctis, MD as PCP - General (Family Medicine) Benita Gutter, RN as Oncology Nurse Navigator Creig Hines, MD as Consulting Physician (Oncology) Campbell Lerner, MD as Consulting Physician (General Surgery)   Name of the patient: Parry Po  [id number]  1945/02/15   Date of visit: 02/23/23  Diagnosis-history of stage I colon cancer  Chief complaint/ Reason for visit-routine follow-up of colon cancer  Heme/Onc history: Patient is a 78 year old male diagnosed with stage I colon cancer s/p low anterior resection with Dr. Claudine Mouton in June 2021.Final pathology showed invasive colorectal adenocarcinoma moderately differentiated with negative margins.  2.3 x 1.7 x 0.7 cm.  No lymphovascular invasion.  16 lymph nodes negative for malignancy.  PT2PN0.  He does not require any surveillance scans without labs for this.   Patient underwent low-dose lung cancer screening in June 2022 which showed a 5.9 mm left lower lobe lung nodule.  Prevascular nodes up to 1 cm.  Prior to that he had a CTA in March 2022 which showed a questionable low-attenuation focus in the left lobe of the liver.  This was followed by an MRI abdomen with and without contrast in June 2022 which showed a simple cyst near the junction of segment 2 and 4 of the liver but otherwise unremarkable  Interval history-bowel movements are regular.  Denies any changes in his appetite or weight.  Denies any abdominal pain.  ECOG PS- 1 Pain scale- 0   Review of systems- Review of Systems  Constitutional:  Negative for chills, fever, malaise/fatigue and weight loss.  HENT:  Negative for congestion, ear discharge and nosebleeds.   Eyes:  Negative for blurred vision.  Respiratory:  Negative for cough, hemoptysis, sputum production, shortness of breath and wheezing.   Cardiovascular:   Negative for chest pain, palpitations, orthopnea and claudication.  Gastrointestinal:  Negative for abdominal pain, blood in stool, constipation, diarrhea, heartburn, melena, nausea and vomiting.  Genitourinary:  Negative for dysuria, flank pain, frequency, hematuria and urgency.  Musculoskeletal:  Negative for back pain, joint pain and myalgias.  Skin:  Negative for rash.  Neurological:  Negative for dizziness, tingling, focal weakness, seizures, weakness and headaches.  Endo/Heme/Allergies:  Does not bruise/bleed easily.  Psychiatric/Behavioral:  Negative for depression and suicidal ideas. The patient does not have insomnia.       No Known Allergies   Past Medical History:  Diagnosis Date   Allergy    Cancer (HCC)    Larynx--carcinoma in situ   Colon cancer (HCC)    COPD (chronic obstructive pulmonary disease) (HCC)    no inhalers   Diabetes mellitus without complication (HCC)    GERD (gastroesophageal reflux disease)    no meds   Headache    h/o migraines   Heart murmur    as a child   Hyperlipidemia    Wears dentures    full upper, partial lower     Past Surgical History:  Procedure Laterality Date   COLONOSCOPY WITH PROPOFOL N/A 06/02/2019   Procedure: COLONOSCOPY WITH PROPOFOL;  Surgeon: Pasty Spillers, MD;  Location: [facility] END[facility];  Service: Endoscopy;  Laterality: N/A;   COLONOSCOPY WITH PROPOFOL N/A 08/15/2020   Procedure: COLONOSCOPY WITH PROPOFOL;  Surgeon: Pasty Spillers, MD;  Location: [facility] END[facility];  Service: Endoscopy;  Laterality: N/A;   COLONOSCOPY WITH PROPOFOL N/A 09/01/2022   Procedure: COLONOSCOPY WITH PROPOFOL;  Surgeon: Toney Reil,  MD;  Location: [facility] ENDOSCOPY;  Service: Gastroenterology;  Laterality: N/A;   CYSTOSCOPY N/A 06/29/2019   Procedure: CYSTOSCOPY;  Surgeon: Vanna Scotland, MD;  Location: [facility] ORS;  Service: Urology;  Laterality: N/A;   DIRECT LARYNGOSCOPY Right 04/03/2016   Procedure: DIRECT MICROLARYNGOSCOPY WITH  EXCISON RIGHT VOCAL CORD LESION;  Surgeon: Vernie Murders, MD;  Location: [facility];  Service: ENT;  Laterality: Right;  RIGHT    INGUINAL HERNIA REPAIR Bilateral 12/18/2015   Procedure: LAPAROSCOPIC BILATERAL INGUINAL HERNIA REPAIR;  Surgeon: Gladis Riffle, MD;  Location: [facility] OR[facility];  Service: General;  Laterality: Bilateral;   MINOR EXCISION OF ORAL LESION Left 03/06/2016   Procedure: direct microlaryngoscopy with excision left vocal cord lesion;  Surgeon: Vernie Murders, MD;  Location: [facility];  Service: ENT;  Laterality: Left;  left vocal cord lesion   POLYPECTOMY  09/01/2022   Procedure: POLYPECTOMY;  Surgeon: Toney Reil, MD;  Location: [facility] END[facility];  Service: Gastroenterology;;   TRIGGER FINGER RELEASE Left 2016   VASECTOMY Bilateral 1978   XI ROBOTIC ASSISTED LOWER ANTERIOR RESECTION N/A 06/29/2019   Procedure: XI ROBOTIC ASSISTED LOWER ANTERIOR RESECTION;  Surgeon: Campbell Lerner, MD;  Location: [facility] ORS;  Service: General;  Laterality: N/A;    Social History   Socioeconomic History   Marital status: Married    Spouse name: Not on file   Number of children: Not on file   Years of education: Not on file   Highest education level: Not on file  Occupational History   Not on file  Tobacco Use   Smoking status: Former    Current packs/day: 0.00    Average packs/day: 1.5 packs/day for 53.0 years (79.5 ttl pk-yrs)    Types: Cigarettes    Start date: 04/18/1962    Quit date: 04/18/2015    Years since quitting: 7.8   Smokeless tobacco: Former  Building services engineer status: Never Used  Substance and Sexual Activity   Alcohol use: Yes    Comment: 6-12 beers in 1 year   Drug use: No   Sexual activity: Not Currently  Other Topics Concern   Not on file  Social History Narrative   Not on file   Social Drivers of Health   Financial Resource Strain: Low Risk  (01/14/2022)   Received from [facility], [facility]    Overall Financial Resource Strain (CARDIA)    Difficulty of Paying Living Expenses: Not hard at all  Food Insecurity: Unknown (01/14/2022)   Received from [facility], [facility]   Hunger Vital Sign    Worried About Running Out of Food in the Last Year: Never true    Ran Out of Food in the Last Year: Not on file  Transportation Needs: No Transportation Needs (01/14/2022)   Received from [facility], [facility]   [facility] - Transportation    In the past 12 months, has lack of transportation kept you from medical appointments or from getting medications?: No    Lack of Transportation (Non-Medical): No  Physical Activity: Not on file  Stress: Not on file  Social Connections: Not on file  Intimate Partner Violence: Not on file    Family History  Problem Relation Age of Onset   Lung cancer Father    Heart disease Father    Hypertension Father    Polycythemia Mother    Heart disease Mother      Current Outpatient  Medications:    ACCU-CHEK GUIDE test strip, , Disp: , Rfl:    acetaminophen (TYLENOL) 500 MG tablet, Take 500 mg by mouth every 6 (six) hours as needed (pain.)., Disp: , Rfl:    ALEVE PM 220-25 MG TABS, Take 0.5 tablets by mouth at bedtime., Disp: , Rfl:    beta carotene w/minerals (OCUVITE) tablet, Take 1 tablet by mouth at bedtime., Disp: , Rfl:    Cholecalciferol (VITAMIN D3) 2000 units capsule, Take 2,000 Units by mouth daily. , Disp: , Rfl:    Coenzyme Q10 100 MG capsule, Take by mouth. (Patient not taking: Reported on 07/18/2022), Disp: , Rfl:    Ferrous Sulfate (IRON) 325 (65 Fe) MG TABS, Take 1 tablet by mouth daily., Disp: , Rfl:    loperamide (IMODIUM) 2 MG capsule, Take 2 mg by mouth as needed for diarrhea or loose stools., Disp: , Rfl:    Magnesium Citrate 100 MG CAPS, Take by mouth., Disp: , Rfl:    Misc Natural Products (OSTEO BI-FLEX TRIPLE STRENGTH PO), Take 1 tablet by mouth in the morning  and at bedtime., Disp: , Rfl:    Multiple Vitamin (MULTIVITAMIN WITH MINERALS) TABS tablet, Take 1 tablet by mouth daily., Disp: , Rfl:    Omega-3 1000 MG CAPS, Take by mouth. (Patient not taking: Reported on 07/18/2022), Disp: , Rfl:    simvastatin (ZOCOR) 80 MG tablet, Take 80 mg by mouth at bedtime. , Disp: , Rfl:    triamcinolone (NASACORT) 55 MCG/ACT AERO nasal inhaler, Place 1 spray into the nose daily. , Disp: , Rfl:    Turmeric (QC TUMERIC COMPLEX PO), Take by mouth., Disp: , Rfl:   Physical exam:  Vitals:   02/23/23 0945  BP: 119/85  Pulse: (!) 59  Resp: 17  Temp: (!) 95.9 F (35.5 C)  TempSrc: Tympanic  SpO2: 100%  Weight: 170 lb (77.1 kg)   Physical Exam Cardiovascular:     Rate and Rhythm: Normal rate and regular rhythm.     Heart sounds: Normal heart sounds.  Pulmonary:     Effort: Pulmonary effort is normal.     Breath sounds: Normal breath sounds.  Abdominal:     General: Bowel sounds are normal. There is no distension.     Tenderness: There is no abdominal tenderness.  Skin:    General: Skin is warm and dry.  Neurological:     Mental Status: He is alert and oriented to person, place, and time.         Latest Ref Rng & Units 02/23/2023    9:27 AM  CMP  Glucose 70 - 99 mg/dL 540   BUN 8 - 23 mg/dL 27   Creatinine 9.81 - 1.24 mg/dL 1.91   Sodium 478 - 295 mmol/L 137   Potassium 3.5 - 5.1 mmol/L 4.8   Chloride 98 - 111 mmol/L 100   CO2 22 - 32 mmol/L 29   Calcium 8.9 - 10.3 mg/dL 9.3   Total Protein 6.5 - 8.1 g/dL 7.0   Total Bilirubin 0.0 - 1.2 mg/dL 0.6   Alkaline Phos 38 - 126 U/L 40   AST 15 - 41 U/L 13   ALT 0 - 44 U/L 16       Latest Ref Rng & Units 02/23/2023    9:27 AM  CBC  WBC 4.0 - 10.5 K/uL 7.9   Hemoglobin 13.0 - 17.0 g/dL 62.1   Hematocrit 30.8 - 52.0 % 38.5   Platelets 150 - 400 K/uL  276       Assessment and plan- Patient is a 78 y.o. male with history of stage I colon cancer in June 2021 here for routine  follow-up  Clinically patient is doing well with no signs and symptoms of recurrence based on today's exam.  He had a colonoscopy in August 2024 which showed a few polyps which were resected and were negative for malignancy.  Repeat colonoscopy was recommended in 3 years by Dr. Allegra Lai.  For stage I colon cancer he does not require any surveillance imaging at this time.  CEA from today is pending.  I will see him back in 1 year with CBC with differential CMP and CEA   Visit Diagnosis 1. Encounter for follow-up surveillance of colon cancer      Dr. Owens Shark, MD, MPH [facility] [phone] 02/23/2023 10:14 AM

## 2023-02-24 LAB — CEA: CEA: 1.4 ng/mL (ref 0.0–4.7)

## 2023-03-11 ENCOUNTER — Telehealth: Payer: Self-pay

## 2023-03-11 ENCOUNTER — Telehealth: Payer: Self-pay | Admitting: *Deleted

## 2023-03-11 NOTE — Telephone Encounter (Signed)
Called and informed patient of lab results. Patient verbalized understanding.  

## 2023-03-11 NOTE — Telephone Encounter (Signed)
-----   Message from Creig Hines sent at 03/11/2023 11:25 AM EST ----- Regarding: RE: lab results Contact: [phone] Please let him know that his cancer markers are normal. Hemoglobin remains stable around 12. Kidney functions are good. Overall no concerns based on his labs ----- Message ----- From: Reggy Eye Sent: 03/11/2023   9:55 AM EST To: Keitha Butte, RN; Creig Hines, MD; # Subject: lab results                                    Patient came for his yearly appt on 02/23/23. He would like someone to call or mail his lab results to him.

## 2023-03-11 NOTE — Telephone Encounter (Signed)
 Kirk Rodriguez spoke with patient.

## 2024-02-23 ENCOUNTER — Ambulatory Visit: Payer: Medicare PPO | Admitting: Oncology

## 2024-02-23 ENCOUNTER — Other Ambulatory Visit: Payer: Medicare PPO
